# Patient Record
Sex: Male | Born: 1975 | Race: White | Hispanic: No | Marital: Single | Smoking: Former smoker
Health system: Southern US, Community
[De-identification: ages and names within clinical notes are randomized; demographics above are authoritative.]

## PROBLEM LIST (undated history)

## (undated) DIAGNOSIS — F419 Anxiety disorder, unspecified: Secondary | ICD-10-CM

## (undated) DIAGNOSIS — J45909 Unspecified asthma, uncomplicated: Secondary | ICD-10-CM

---

## 2005-04-14 ENCOUNTER — Emergency Department: Payer: Self-pay | Admitting: Emergency Medicine

## 2005-04-20 ENCOUNTER — Emergency Department: Payer: Self-pay | Admitting: Emergency Medicine

## 2006-08-19 ENCOUNTER — Emergency Department: Payer: Self-pay | Admitting: General Practice

## 2007-02-13 ENCOUNTER — Emergency Department: Payer: Self-pay | Admitting: Emergency Medicine

## 2007-05-05 ENCOUNTER — Emergency Department: Payer: Self-pay | Admitting: Emergency Medicine

## 2007-12-23 ENCOUNTER — Emergency Department: Payer: Self-pay | Admitting: Emergency Medicine

## 2010-01-03 ENCOUNTER — Emergency Department: Payer: Self-pay | Admitting: Emergency Medicine

## 2010-08-09 ENCOUNTER — Emergency Department: Payer: Self-pay | Admitting: Emergency Medicine

## 2011-01-20 ENCOUNTER — Emergency Department: Payer: Self-pay | Admitting: Emergency Medicine

## 2011-01-24 ENCOUNTER — Emergency Department: Payer: Self-pay | Admitting: Unknown Physician Specialty

## 2011-11-30 ENCOUNTER — Emergency Department: Payer: Self-pay | Admitting: Emergency Medicine

## 2013-01-21 ENCOUNTER — Emergency Department: Payer: Self-pay | Admitting: Emergency Medicine

## 2014-02-23 ENCOUNTER — Ambulatory Visit: Payer: Self-pay | Admitting: Orthopedic Surgery

## 2014-05-18 DIAGNOSIS — M5030 Other cervical disc degeneration, unspecified cervical region: Secondary | ICD-10-CM | POA: Insufficient documentation

## 2016-03-09 ENCOUNTER — Emergency Department
Admission: EM | Admit: 2016-03-09 | Discharge: 2016-03-09 | Disposition: A | Discharge status: Home / Self Care | Payer: Medicaid Other | Attending: Emergency Medicine | Admitting: Emergency Medicine

## 2016-03-09 ENCOUNTER — Encounter: Payer: Self-pay | Admitting: Emergency Medicine

## 2016-03-09 DIAGNOSIS — Y9384 Activity, sleeping: Secondary | ICD-10-CM | POA: Diagnosis not present

## 2016-03-09 DIAGNOSIS — X501XXA Overexertion from prolonged static or awkward postures, initial encounter: Secondary | ICD-10-CM | POA: Diagnosis not present

## 2016-03-09 DIAGNOSIS — Y999 Unspecified external cause status: Secondary | ICD-10-CM | POA: Insufficient documentation

## 2016-03-09 DIAGNOSIS — S4991XA Unspecified injury of right shoulder and upper arm, initial encounter: Secondary | ICD-10-CM | POA: Diagnosis present

## 2016-03-09 DIAGNOSIS — Y929 Unspecified place or not applicable: Secondary | ICD-10-CM | POA: Insufficient documentation

## 2016-03-09 DIAGNOSIS — J45909 Unspecified asthma, uncomplicated: Secondary | ICD-10-CM | POA: Insufficient documentation

## 2016-03-09 DIAGNOSIS — Z87891 Personal history of nicotine dependence: Secondary | ICD-10-CM | POA: Insufficient documentation

## 2016-03-09 DIAGNOSIS — M75101 Unspecified rotator cuff tear or rupture of right shoulder, not specified as traumatic: Secondary | ICD-10-CM

## 2016-03-09 DIAGNOSIS — M25511 Pain in right shoulder: Secondary | ICD-10-CM

## 2016-03-09 DIAGNOSIS — M12811 Other specific arthropathies, not elsewhere classified, right shoulder: Secondary | ICD-10-CM

## 2016-03-09 DIAGNOSIS — S46011A Strain of muscle(s) and tendon(s) of the rotator cuff of right shoulder, initial encounter: Secondary | ICD-10-CM | POA: Insufficient documentation

## 2016-03-09 DIAGNOSIS — M129 Arthropathy, unspecified: Secondary | ICD-10-CM | POA: Diagnosis not present

## 2016-03-09 HISTORY — DX: Unspecified asthma, uncomplicated: J45.909

## 2016-03-09 HISTORY — DX: Anxiety disorder, unspecified: F41.9

## 2016-03-09 MED ORDER — BACLOFEN 10 MG PO TABS: 10.0000 mg | ORAL_TABLET | Freq: Three times a day (TID) | ORAL | Status: DC

## 2016-03-09 MED ORDER — DIAZEPAM 5 MG/ML IJ SOLN
5.0000 mg | Freq: Once | INTRAMUSCULAR | Status: AC
  Administered 2016-03-09: 5 mg via INTRAVENOUS
  Filled 2016-03-09: qty 2

## 2016-03-09 MED ORDER — IBUPROFEN 800 MG PO TABS: 800.0000 mg | ORAL_TABLET | Freq: Three times a day (TID) | ORAL | Status: DC | PRN

## 2016-03-09 MED ORDER — HYDROMORPHONE HCL 1 MG/ML IJ SOLN
1.0000 mg | Freq: Once | INTRAMUSCULAR | Status: AC
Start: 2016-03-09 — End: 2016-03-09
  Administered 2016-03-09: 1 mg via INTRAVENOUS
  Filled 2016-03-09: qty 1

## 2016-03-09 MED ORDER — OXYCODONE-ACETAMINOPHEN 5-325 MG PO TABS: 1.0000 | ORAL_TABLET | ORAL | Status: DC | PRN

## 2016-03-09 NOTE — ED Notes (Addendum)
Pt arrived via EMS from home with shoulder and arm pain on the right side.  Patient reports 2 days ago patient noticed a pinch in neck muscle and c/o pain down to right elbow.  C/o not being able to feel right hand.  Pain is similar to RTC tear in 2015.  Denies any injury at this time.  Patient states he was asleep in the chair.  Patient states fingers feel numb.  Pt did not have surgery and states he tried other modalities instead of surgery.

## 2016-03-09 NOTE — Discharge Instructions (Signed)
Shoulder Pain The shoulder is the joint that connects your arms to your body. The bones that form the shoulder joint include the upper arm bone (humerus), the shoulder blade (scapula), and the collarbone (clavicle). The top of the humerus is shaped like a ball and fits into a rather flat socket on the scapula (glenoid cavity). A combination of muscles and strong, fibrous tissues that connect muscles to bones (tendons) support your shoulder joint and hold the ball in the socket. Small, fluid-filled sacs (bursae) are located in different areas of the joint. They act as cushions between the bones and the overlying soft tissues and help reduce friction between the gliding tendons and the bone as you move your arm. Your shoulder joint allows a wide range of motion in your arm. This range of motion allows you to do things like scratch your back or throw a ball. However, this range of motion also makes your shoulder more prone to pain from overuse and injury. Causes of shoulder pain can originate from both injury and overuse and usually can be grouped in the following four categories:  Redness, swelling, and pain (inflammation) of the tendon (tendinitis) or the bursae (bursitis).  Instability, such as a dislocation of the joint.  Inflammation of the joint (arthritis).  Broken bone (fracture). HOME CARE INSTRUCTIONS   Apply ice to the sore area.  Put ice in a plastic bag.  Place a towel between your skin and the bag.  Leave the ice on for 15-20 minutes, 3-4 times per day for the first 2 days, or as directed by your health care provider.  Stop using cold packs if they do not help with the pain.  If you have a shoulder sling or immobilizer, wear it as long as your caregiver instructs. Only remove it to shower or bathe. Move your arm as little as possible, but keep your hand moving to prevent swelling.  Squeeze a soft ball or foam pad as much as possible to help prevent swelling.  Only take  over-the-counter or prescription medicines for pain, discomfort, or fever as directed by your caregiver. SEEK MEDICAL CARE IF:   Your shoulder pain increases, or new pain develops in your arm, hand, or fingers.  Your hand or fingers become cold and numb.  Your pain is not relieved with medicines. SEEK IMMEDIATE MEDICAL CARE IF:   Your arm, hand, or fingers are numb or tingling.  Your arm, hand, or fingers are significantly swollen or turn white or blue. MAKE SURE YOU:   Understand these instructions.  Will watch your condition.  Will get help right away if you are not doing well or get worse.   This information is not intended to replace advice given to you by your health care provider. Make sure you discuss any questions you have with your health care provider.   Document Released: 07/17/2005 Document Revised: 10/28/2014 Document Reviewed: 01/30/2015 Elsevier Interactive Patient Education 2016 [place] therapy can help ease sore, stiff, injured, and tight muscles and joints. Heat relaxes your muscles, which may help ease your pain. Heat therapy should only be used on old, pre-existing, or long-lasting (chronic) injuries. Do not use heat therapy unless told by your doctor. HOW TO USE HEAT THERAPY There are several different kinds of heat therapy, including:  Moist heat pack.  Warm water bath.  Hot water bottle.  Electric heating pad.  Heated gel pack.  Heated wrap.  Electric heating pad. GENERAL HEAT THERAPY RECOMMENDATIONS  Do not sleep while using heat therapy. Only use heat therapy while you are awake.  Your skin may turn pink while using heat therapy. Do not use heat therapy if your skin turns red.  Do not use heat therapy if you have new pain.  High heat or long exposure to heat can cause burns. Be careful when using heat therapy to avoid burning your skin.  Do not use heat therapy on areas of your skin that are already irritated,  such as with a rash or sunburn. GET HELP IF:   You have blisters, redness, swelling (puffiness), or numbness.  You have new pain.  Your pain is worse. MAKE SURE YOU:  Understand these instructions.  Will watch your condition.  Will get help right away if you are not doing well or get worse.   This information is not intended to replace advice given to you by your health care provider. Make sure you discuss any questions you have with your health care provider.   Document Released: 12/30/2011 Document Revised: 10/28/2014 Document Reviewed: 11/30/2013 Elsevier Interactive Patient Education 2016 Elsevier Inc.  Cryotherapy Cryotherapy is when you put ice on your injury. Ice helps lessen pain and puffiness (swelling) after an injury. Ice works the best when you start using it in the first 24 to 48 hours after an injury. HOME CARE  Put a dry or damp towel between the ice pack and your skin.  You may press gently on the ice pack.  Leave the ice on for no more than 10 to 20 minutes at a time.  Check your skin after 5 minutes to make sure your skin is okay.  Rest at least 20 minutes between ice pack uses.  Stop using ice when your skin loses feeling (numbness).  Do not use ice on someone who cannot tell you when it hurts. This includes small children and people with memory problems (dementia). GET HELP RIGHT AWAY IF:  You have white spots on your skin.  Your skin turns blue or pale.  Your skin feels waxy or hard.  Your puffiness gets worse. MAKE SURE YOU:   Understand these instructions.  Will watch your condition.  Will get help right away if you are not doing well or get worse.   This information is not intended to replace advice given to you by your health care provider. Make sure you discuss any questions you have with your health care provider.   Document Released: 03/25/2008 Document Revised: 12/30/2011 Document Reviewed: 05/30/2011 Elsevier Interactive Patient  Education [employer]2016 Elsevier Inc.

## 2016-03-09 NOTE — ED Provider Notes (Signed)
[facility] Emergency Department Provider Note  ____________________________________________  Time seen: Approximately 12:08 PM  I have reviewed the triage vital signs and the nursing notes.   HISTORY  Chief Complaint Shoulder Pain and Arm Pain    HPI Jeffery King is a 40 y.o. male presents for evaluation via EMS for evaluation of severe pain in his neck and right shoulder radiating down his right elbow. Patient denies any trauma but reports that he has a rotator cuff tear back in 2015. Denies any recent injury other than he was sleeping in chair and felt like he slept wrong. Pain is progressively gotten worse over the last 48 hours states that he cannot feel his fingers that they feel decreased in sensation. Pain is 10 over 10. He has had physical therapy in the past for the shoulder and was scheduled to be operated on but his postpone the surgery.   Past Medical History  Diagnosis Date  . Asthma   . Anxiety     There are no active problems to display for this patient.   History reviewed. No pertinent past surgical history.  Current Outpatient Rx  Name  Route  Sig  Dispense  Refill  . baclofen (LIORESAL) 10 MG tablet   Oral   Take 1 tablet (10 mg total) by mouth 3 (three) times daily.   30 tablet   0   . ibuprofen (ADVIL,MOTRIN) 800 MG tablet   Oral   Take 1 tablet (800 mg total) by mouth every 8 (eight) hours as needed.   30 tablet   0   . oxyCODONE-acetaminophen (ROXICET) 5-325 MG tablet   Oral   Take 1-2 tablets by mouth every 4 (four) hours as needed for severe pain.   15 tablet   0     Allergies Aspirin; Gabapentin; Wellbutrin; and Ibuprofen  History reviewed. No pertinent family history.  Social History Social History  Substance Use Topics  . Smoking status: Former Games developermoker  . Smokeless tobacco: None  . Alcohol Use: No    Review of Systems Constitutional: No fever/chills Cardiovascular: Denies chest pain. Respiratory:  Denies shortness of breath. Musculoskeletal: Positive for cervical and right shoulder pain. Skin: Negative for rash. Neurological: Negative for headaches, focal weakness or numbness.  10-point ROS otherwise negative.  ____________________________________________   PHYSICAL EXAM:  VITAL SIGNS: ED Triage Vitals  Enc Vitals Group     BP 03/09/16 1146 117/80 mmHg     Pulse Rate 03/09/16 1146 117     Resp 03/09/16 1146 22     Temp 03/09/16 1146 98.2 F (36.8 C)     Temp Source 03/09/16 1146 Oral     SpO2 03/09/16 1146 98 %     Weight 03/09/16 1146 145 lb (65.772 kg)     Height 03/09/16 1146 5\' 11"  (1.803 m)     Head Cir --      Peak Flow --      Pain Score 03/09/16 1147 10     Pain Loc --      Pain Edu? --      Excl. in GC? --     Constitutional: Alert and oriented. Well appearing and in no acute distress. Neck: No stridor. Limited range of motion increased pain with flexion extension and lateralization.  Cardiovascular: Normal rate, regular rhythm. Grossly normal heart sounds.  Good peripheral circulation. Respiratory: Normal respiratory effort.  No retractions. Lungs CTAB. Musculoskeletal: Severe point tenderness to the cervical spine into the right shoulder. Resists  all type of movement abduction and abduction. Neurologic:  Normal speech and language. No gross focal neurologic deficits are appreciated. No gait instability. Skin:  Skin is warm, dry and intact. No rash noted. Psychiatric: Mood and affect are normal. Speech and behavior are normal.  ____________________________________________   LABS (all labs ordered are listed, but only abnormal results are displayed)  Labs Reviewed - No data to display ____________________________________________  EKG   ____________________________________________  RADIOLOGY  MRI reviewed from 2015. ____________________________________________   PROCEDURES  Procedure(s) performed: None  Critical Care performed:  No  ____________________________________________   INITIAL IMPRESSION / ASSESSMENT AND PLAN / ED COURSE  Pertinent labs & imaging results that were available during my care of the patient were reviewed by me and considered in my medical decision making (see chart for details).  Patient guided infraspinatus and supraspinatus tears. Patient responded well to Dilaudid and Valium IV. Patient follow-up with his orthopedic surgeon next week as scheduled. He voices no other emergency medical complaints at this time and states pain is down to a 5/10. ____________________________________________   FINAL CLINICAL IMPRESSION(S) / ED DIAGNOSES  Final diagnoses:  Shoulder pain, acute, right  Rotator cuff tear arthropathy of right shoulder     This chart was dictated using voice recognition software/Dragon. Despite best efforts to proofread, errors can occur which can change the meaning. Any change was purely unintentional.   Evangeline Dakin, PA-C 03/09/16 1238  Arnaldo Natal, MD 03/09/16 [phone]

## 2016-03-28 ENCOUNTER — Encounter: Payer: Self-pay | Admitting: Family Medicine

## 2016-03-28 ENCOUNTER — Ambulatory Visit (INDEPENDENT_AMBULATORY_CARE_PROVIDER_SITE_OTHER): Payer: Medicaid Other | Admitting: Family Medicine

## 2016-03-28 VITALS — BP 140/90 | HR 91 | Temp 98.5°F | Resp 16 | Ht 71.0 in | Wt 154.0 lb

## 2016-03-28 DIAGNOSIS — F419 Anxiety disorder, unspecified: Secondary | ICD-10-CM

## 2016-03-28 DIAGNOSIS — J4530 Mild persistent asthma, uncomplicated: Secondary | ICD-10-CM | POA: Insufficient documentation

## 2016-03-28 DIAGNOSIS — J4520 Mild intermittent asthma, uncomplicated: Secondary | ICD-10-CM

## 2016-03-28 DIAGNOSIS — M7541 Impingement syndrome of right shoulder: Secondary | ICD-10-CM

## 2016-03-28 MED ORDER — METHYLPREDNISOLONE 4 MG PO TABS
4.0000 mg | ORAL_TABLET | Freq: Every day | ORAL | Status: DC
Start: 2016-03-28 — End: 2016-08-01

## 2016-03-28 NOTE — Assessment & Plan Note (Signed)
Appears well controlled. Continue PRN rescue inhaler. Consider spirometry next visit.

## 2016-03-28 NOTE — Progress Notes (Signed)
Subjective:    Patient ID: Jeffery King, male    DOB: 09-08-1976, 40 y.o.   MRN: [id number]  HPI: Jeffery King is a 39 y.o. male presenting on 03/28/2016 for Establish Care   HPI  Pt presents to establish care today. Previous care provider was .  It has been Tejan-Sie month since His last PCP visit. Records from previous provider will be requested and reviewed. Current medical problems include:  Rotator Cuff tear: Was supposed to have surgery in 2015. Was feeling better with PT- so did not have surgery. Woke up 2 weeks ago with a crick in his neck and unable to move shoulder. Seen in ER on 5/20- given oxycodone and muscle relaxant. Pain has eased up still having numbness in R hand.  Was waiting for referral to ortho but did not hear yet. Still has disc with MRI.  Anxiety: Is currently on Buspar and Xanax for past 3 years. Takes Xanax TID  Every day. Buspar was making him feel like a zombie- taking 1/2 tablet twice daily. . Was previously seeing psychiatry. 5 years ago. Cannot remember other previous medications.  Asthma: Stopped smoking 1 year ago. Dx age 33. Uses Pro-Air for rescue. No exercise induced bronchospasm. Has not had exacerbation is many years.    Health maintenance:  Last labs- last month.    Past Medical History  Diagnosis Date  . Asthma   . Anxiety    Social History   Social History  . Marital Status: Single    Spouse Name: N/A  . Number of Children: N/A  . Years of Education: N/A   Occupational History  . Not on file.   Social History Main Topics  . Smoking status: Former Games developer  . Smokeless tobacco: Not on file  . Alcohol Use: No  . Drug Use: No  . Sexual Activity: Not on file   Other Topics Concern  . Not on file   Social History Narrative   Family History  Problem Relation Age of Onset  . Hypertension Mother   . Hypertension Father   . Hypertension Brother    Current Outpatient Prescriptions on File Prior to Visit  Medication Sig  . baclofen  (LIORESAL) 10 MG tablet Take 1 tablet (10 mg total) by mouth 3 (three) times daily.  Marland Kitchen oxyCODONE-acetaminophen (ROXICET) 5-325 MG tablet Take 1-2 tablets by mouth every 4 (four) hours as needed for severe pain.   No current facility-administered medications on file prior to visit.    Review of Systems  Constitutional: Negative for fever and chills.  HENT: Negative.   Respiratory: Negative for chest tightness, shortness of breath and wheezing.   Cardiovascular: Negative for chest pain, palpitations and leg swelling.  Gastrointestinal: Negative for nausea, vomiting and abdominal pain.  Endocrine: Negative.   Genitourinary: Negative for dysuria, urgency, discharge, penile pain and testicular pain.  Musculoskeletal: Negative for back pain, joint swelling and arthralgias.  Skin: Negative.   Neurological: Negative for dizziness, weakness, numbness and headaches.  Psychiatric/Behavioral: Negative for sleep disturbance and dysphoric mood.   Per HPI unless specifically indicated above     Objective:    BP 140/90 mmHg  Pulse 91  Temp(Src) 98.5 F (36.9 C) (Oral)  Resp 16  Ht  (1.803 m)  Wt 154 lb (69.854 kg)  BMI 21.49 kg/m2  Wt Readings from Last 3 Encounters:  03/28/16 154 lb (69.854 kg)  03/09/16 145 lb (65.772 kg)    Physical Exam  Constitutional: He is  oriented to person, place, and time. He appears well-developed and well-nourished. No distress.  HENT:  Head: Normocephalic and atraumatic.  Neck: Neck supple. No thyromegaly present.  Cardiovascular: Normal rate, regular rhythm and normal heart sounds.  Exam reveals no gallop and no friction rub.   No murmur heard. Pulmonary/Chest: Effort normal and breath sounds normal. He has no wheezes.  Abdominal: Soft. Bowel sounds are normal. He exhibits no distension. There is no tenderness. There is no rebound.  Musculoskeletal: He exhibits no edema.       Right shoulder: He exhibits decreased range of motion (Resists adduction  and abduction), tenderness and decreased strength.       Left shoulder: Normal.  Neurological: He is alert and oriented to person, place, and time. He has normal reflexes.  Skin: Skin is warm and dry. No rash noted. No erythema.  Psychiatric: He has a normal mood and affect. His behavior is normal. Thought content normal.   No results found for this or any previous visit.    Assessment & Plan:   Problem List Items Addressed This Visit      Respiratory   Asthma    Appears well controlled. Continue PRN rescue inhaler. Consider spirometry next visit.       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 Pt wanted to see Dr. Kelby AlineJoe Wilson at [facility] in [place]- however his first available was July 10. Pt is amenable to seeing [facility] in [place] at this time. Currently taking medrol dose pak and PRN pain medication. Well controlled.       Relevant Medications   methylPREDNISolone (MEDROL) 4 MG tablet   Other Relevant Orders   Ambulatory referral to Orthopedic Surgery   Anxiety    Currently taking buspar and xanax. Reviewed with patient that this office does not do long-term Xanax therapy for anxiety.  Pt is amenable to trying a new long-term medication after his shoulder surgery.       Relevant Medications   ALPRAZolam (XANAX) 1 MG tablet   busPIRone (BUSPAR) 5 MG tablet      Meds ordered this encounter  Medications  . albuterol (PROAIR HFA) 108 (90 Base) MCG/ACT inhaler    Sig: Inhale into the lungs.  . ALPRAZolam (XANAX) 1 MG tablet    Sig: Take by mouth.  . busPIRone (BUSPAR) 5 MG tablet    Sig: Take by mouth.  . methylPREDNISolone (MEDROL) 4 MG tablet    Sig: Take 1 tablet (4 mg total) by mouth daily. Take as directed.    Dispense:  21 tablet    Refill:  0     Order Specific Question:  Supervising Provider    Answer:  Janeann ForehandHAWKINS JR, JAMES H [[id number]]      Follow up plan: Return in about 4 weeks (around 04/25/2016) for Anxiety/ Asthma.

## 2016-03-28 NOTE — Assessment & Plan Note (Signed)
Currently taking buspar and xanax. Reviewed with patient that this office does not do long-term Xanax therapy for anxiety.  Pt is amenable to trying a new long-term medication after his shoulder surgery.

## 2016-03-28 NOTE — Patient Instructions (Signed)
We have placed a referral to [facility] for you. Hopefully you will hear in about 2 weeks about an appt.

## 2016-03-28 NOTE — Assessment & Plan Note (Signed)
Referral to [facility] placed today to determine if surgery is necessary. Pt wanted to see Dr. Kelby AlineJoe Wilson at [facility] in [place]- however his first available was July 10. Pt is amenable to seeing [facility] in [place] at this time. Currently taking medrol dose pak and PRN pain medication. Well controlled.

## 2016-04-17 ENCOUNTER — Telehealth: Payer: Self-pay | Admitting: *Deleted

## 2016-04-17 ENCOUNTER — Telehealth: Payer: Self-pay | Admitting: Family Medicine

## 2016-04-17 NOTE — Telephone Encounter (Signed)
Pt said he runs out of alprazolam and burpirone today and can't wait until Amy gets back on Monday to get refills.  Please call [phone]

## 2016-04-17 NOTE — Telephone Encounter (Signed)
Called patient to inform AK on vacation. We have received refill request for Buspar and Xanax.

## 2016-04-18 ENCOUNTER — Telehealth: Payer: Self-pay | Admitting: Family Medicine

## 2016-04-18 MED ORDER — ALPRAZOLAM 1 MG PO TABS: 15.0000 mg | ORAL_TABLET | Freq: Three times a day (TID) | ORAL | Status: DC

## 2016-04-18 MED ORDER — BUSPIRONE HCL 5 MG PO TABS: 5.0000 mg | ORAL_TABLET | ORAL | Status: DC

## 2016-04-18 NOTE — Telephone Encounter (Signed)
I will give him enough of these to last until Monday only.  Can order enough for 4 days.  Check with me if questions about strength and directions.-jh

## 2016-04-18 NOTE — Telephone Encounter (Signed)
Dr. Juanetta GoslingHawkins gave patient enough of medication to last until Mon 7/3 when AK returns. Patient's father presented today to p/u prescriptions. Since they were controlled substance a call was placed to patient by Rush BarerJanice Harlukowi to get verbal permission to give rx to his father. Patient apologized and said it was ok to give rx to his father.[place]

## 2016-04-18 NOTE — Telephone Encounter (Signed)
Left message rx ready for pick-up.[place]

## 2016-04-24 ENCOUNTER — Other Ambulatory Visit: Payer: Self-pay | Admitting: Family Medicine

## 2016-04-24 ENCOUNTER — Other Ambulatory Visit: Payer: Self-pay | Admitting: Orthopedic Surgery

## 2016-04-24 DIAGNOSIS — M5412 Radiculopathy, cervical region: Secondary | ICD-10-CM

## 2016-04-24 MED ORDER — BUSPIRONE HCL 7.5 MG PO TABS: 7.5000 mg | ORAL_TABLET | ORAL | Status: DC

## 2016-04-24 NOTE — Telephone Encounter (Signed)
Patient advised via voicemail.[place]

## 2016-04-24 NOTE — Telephone Encounter (Signed)
Pt. Call requesting a refill on Alprazolam 1 mg, Buspirone 5mg  (he said 7.5 mg ? Did not see that in  Pt chart) Pt  Call back  #  [phone]

## 2016-04-24 NOTE — Telephone Encounter (Signed)
Please let this patient know that I require an appt to refill a controlled medication (Xanax) if I have never filled it before. I have filled the buspar. It is waiting at the pharmacy.

## 2016-04-25 ENCOUNTER — Telehealth: Payer: Self-pay | Admitting: Family Medicine

## 2016-04-25 ENCOUNTER — Other Ambulatory Visit: Payer: Self-pay | Admitting: Family Medicine

## 2016-04-25 MED ORDER — ALPRAZOLAM 1 MG PO TABS: 1.0000 mg | ORAL_TABLET | Freq: Three times a day (TID) | ORAL | Status: DC

## 2016-04-30 ENCOUNTER — Ambulatory Visit (INDEPENDENT_AMBULATORY_CARE_PROVIDER_SITE_OTHER): Payer: Medicaid Other | Admitting: Family Medicine

## 2016-04-30 VITALS — BP 124/88 | HR 144 | Temp 98.8°F | Resp 16 | Ht 71.0 in | Wt 150.0 lb

## 2016-04-30 DIAGNOSIS — Z8249 Family history of ischemic heart disease and other diseases of the circulatory system: Secondary | ICD-10-CM | POA: Diagnosis not present

## 2016-04-30 DIAGNOSIS — F419 Anxiety disorder, unspecified: Secondary | ICD-10-CM | POA: Diagnosis not present

## 2016-04-30 DIAGNOSIS — J4520 Mild intermittent asthma, uncomplicated: Secondary | ICD-10-CM | POA: Diagnosis not present

## 2016-04-30 MED ORDER — ALPRAZOLAM 1 MG PO TABS: 1.0000 mg | ORAL_TABLET | Freq: Three times a day (TID) | ORAL | Status: DC

## 2016-04-30 NOTE — Assessment & Plan Note (Signed)
Refilled pt medications today. Have had a long discussion about Xanax- this office typically does not use longterm Xanax. Pt feels this combination has worked well for him since being started by anxiety but does have a goal to get off the medication eventually. Will make no medication changes today but will make changes in the future.

## 2016-04-30 NOTE — Assessment & Plan Note (Signed)
Controlled.  

## 2016-04-30 NOTE — Progress Notes (Signed)
Subjective:    Patient ID: Jeffery King, male    DOB: 1975/12/22, 40 y.o.   MRN: [id number]  HPI: Jeffery King is a 40 y.o. male presenting on 04/30/2016 for Anxiety   HPI  Pt presents for follow-up of anxiety and asthma.  Anxiety: Was given a refill of buspirone- at 7.5mg - taking 3.75mg  1/2 tablet every other day. Works for anxiety. Is taking Xanax  Up to 3 times per day as needed. Does not always take 3 times per day. Was previously seen by psychiatry.  Arm pain- is improving. Thought to be a pinched nerve in back. Seeing Dr. Real ConsKrazinski for shoulder pain. MRI schedule 7/20.  Asthma- controlled no cough, wheezing. No recent inhaler use.   Past Medical History  Diagnosis Date  . Asthma   . Anxiety     Current Outpatient Prescriptions on File Prior to Visit  Medication Sig  . albuterol (PROAIR HFA) 108 (90 Base) MCG/ACT inhaler Inhale into the lungs.  . baclofen (LIORESAL) 10 MG tablet Take 1 tablet (10 mg total) by mouth 3 (three) times daily.  . busPIRone (BUSPAR) 7.5 MG tablet Take 1 tablet (7.5 mg total) by mouth every other day.  . methylPREDNISolone (MEDROL) 4 MG tablet Take 1 tablet (4 mg total) by mouth daily. Take as directed.  Marland Kitchen. oxyCODONE-acetaminophen (ROXICET) 5-325 MG tablet Take 1-2 tablets by mouth every 4 (four) hours as needed for severe pain.   No current facility-administered medications on file prior to visit.    Review of Systems  Constitutional: Negative for fever and chills.  HENT: Negative.   Respiratory: Negative for chest tightness, shortness of breath and wheezing.   Cardiovascular: Negative for chest pain, palpitations and leg swelling.  Gastrointestinal: Negative for nausea, vomiting and abdominal pain.  Endocrine: Negative.   Genitourinary: Negative for dysuria, urgency, discharge, penile pain and testicular pain.  Musculoskeletal: Negative for back pain, joint swelling and arthralgias.  Skin: Negative.   Neurological: Negative for dizziness,  weakness, numbness and headaches.  Psychiatric/Behavioral: Negative for sleep disturbance and dysphoric mood.   Per HPI unless specifically indicated above     Objective:    BP 124/88 mmHg  Pulse 144  Temp(Src) 98.8 F (37.1 C) (Oral)  Resp 16  Ht 5\' 11"  (1.803 m)  Wt 150 lb (68.04 kg)  BMI 20.93 kg/m2  Wt Readings from Last 3 Encounters:  04/30/16 150 lb (68.04 kg)  03/28/16 154 lb (69.854 kg)  03/09/16 145 lb (65.772 kg)    Physical Exam  Constitutional: He is oriented to person, place, and time. He appears well-developed and well-nourished. No distress.  HENT:  Head: Normocephalic and atraumatic.  Neck: Neck supple. No thyromegaly present.  Cardiovascular: Normal rate, regular rhythm and normal heart sounds.  Exam reveals no gallop and no friction rub.   No murmur heard. Pulmonary/Chest: Effort normal and breath sounds normal. He has no wheezes.  Abdominal: Soft. Bowel sounds are normal. He exhibits no distension. There is no tenderness. There is no rebound.  Musculoskeletal: Normal range of motion. He exhibits no edema or tenderness.  Neurological: He is alert and oriented to person, place, and time. He has normal reflexes.  Skin: Skin is warm and dry. No rash noted. No erythema.  Psychiatric: He has a normal mood and affect. His behavior is normal. Thought content normal.   No results found for this or any previous visit.    GAD 7 : Generalized Anxiety Score 04/30/2016  Nervous, Anxious, on Edge  0  Control/stop worrying 3  Worry too much - different things 3  Trouble relaxing 0  Restless 0  Easily annoyed or irritable 0  Afraid - awful might happen 0  Total GAD 7 Score 6  Anxiety Difficulty Somewhat difficult      Assessment & Plan:   Problem List Items Addressed This Visit      Respiratory   Asthma    Controlled.       Relevant Orders   BASIC METABOLIC PANEL WITH GFR     Other   Anxiety - Primary    Refilled pt medications today. Have had a long  discussion about Xanax- this office typically does not use longterm Xanax. Pt feels this combination has worked well for him since being started by anxiety but does have a goal to get off the medication eventually. Will make no medication changes today but will make changes in the future.       Relevant Medications   ALPRAZolam (XANAX) 1 MG tablet   Other Relevant Orders   TSH   VITAMIN D 25 Hydroxy (Vit-D Deficiency, Fractures)    Other Visit Diagnoses    Family history of heart disease        Relevant Orders    Lipid Profile       Meds ordered this encounter  Medications  . ALPRAZolam (XANAX) 1 MG tablet    Sig: Take 1 tablet (1 mg total) by mouth 3 (three) times daily.    Dispense:  90 tablet    Refill:  2    Order Specific Question:  Supervising Provider    Answer:  Janeann Forehand [[id number]]      Follow up plan: Return in about 3 months (around 07/31/2016) for anxiety. Marland Kitchen

## 2016-04-30 NOTE — Patient Instructions (Addendum)
I have filled your medications today. We will get some lab work today as well.

## 2016-05-01 ENCOUNTER — Other Ambulatory Visit: Payer: Self-pay | Admitting: Family Medicine

## 2016-05-01 DIAGNOSIS — E559 Vitamin D deficiency, unspecified: Secondary | ICD-10-CM

## 2016-05-01 LAB — LIPID PANEL
CHOL/HDL RATIO: 2.7 ratio (ref <=5.0)
CHOLESTEROL: 148 mg/dL (ref 125–200)
HDL: 55 mg/dL (ref >=40)
LDL Cholesterol: 73 mg/dL (ref <130)
Triglycerides: 102 mg/dL (ref <150)
VLDL: 20 mg/dL (ref <30)

## 2016-05-01 LAB — BASIC METABOLIC PANEL WITH GFR
BUN: 8 mg/dL (ref 7–25)
CHLORIDE: 103 mmol/L (ref 98–110)
CO2: 26 mmol/L (ref 20–31)
Calcium: 9.6 mg/dL (ref 8.6–10.3)
Creat: 1.13 mg/dL (ref 0.60–1.35)
GFR, EST NON AFRICAN AMERICAN: 80 mL/min (ref >=60)
GFR, Est African American: >89 mL/min (ref >=60)
Glucose, Bld: 85 mg/dL (ref 65–99)
POTASSIUM: 4.3 mmol/L (ref 3.5–5.3)
Sodium: 142 mmol/L (ref 135–146)

## 2016-05-01 LAB — VITAMIN D 25 HYDROXY (VIT D DEFICIENCY, FRACTURES): Vit D, 25-Hydroxy: 21 ng/mL — ABNORMAL LOW (ref 30–100)

## 2016-05-01 LAB — TSH: TSH: 2.43 mIU/L (ref 0.40–4.50)

## 2016-05-01 MED ORDER — VITAMIN D (ERGOCALCIFEROL) 1.25 MG (50000 UNIT) PO CAPS: 50000.0000 [IU] | ORAL_CAPSULE | ORAL | Status: DC

## 2016-05-01 NOTE — Telephone Encounter (Signed)
Error

## 2016-05-09 ENCOUNTER — Ambulatory Visit
Admission: RE | Admit: 2016-05-09 | Discharge: 2016-05-09 | Disposition: A | Discharge status: Home / Self Care | Payer: Medicaid Other | Source: Ambulatory Visit | Attending: Orthopedic Surgery | Admitting: Orthopedic Surgery

## 2016-05-09 DIAGNOSIS — M50222 Other cervical disc displacement at C5-C6 level: Secondary | ICD-10-CM | POA: Insufficient documentation

## 2016-05-09 DIAGNOSIS — M5412 Radiculopathy, cervical region: Secondary | ICD-10-CM | POA: Diagnosis present

## 2016-08-01 ENCOUNTER — Ambulatory Visit (INDEPENDENT_AMBULATORY_CARE_PROVIDER_SITE_OTHER): Payer: Medicaid Other | Admitting: Family Medicine

## 2016-08-01 VITALS — BP 138/57 | HR 92 | Temp 98.7°F | Resp 16 | Ht 71.0 in | Wt 159.0 lb

## 2016-08-01 DIAGNOSIS — J4530 Mild persistent asthma, uncomplicated: Secondary | ICD-10-CM | POA: Diagnosis not present

## 2016-08-01 DIAGNOSIS — K219 Gastro-esophageal reflux disease without esophagitis: Secondary | ICD-10-CM

## 2016-08-01 DIAGNOSIS — F419 Anxiety disorder, unspecified: Secondary | ICD-10-CM

## 2016-08-01 DIAGNOSIS — H6122 Impacted cerumen, left ear: Secondary | ICD-10-CM

## 2016-08-01 LAB — CBC WITH DIFFERENTIAL/PLATELET
BASOS ABS: 0 {cells}/uL (ref 0–200)
Basophils Relative: 0 %
EOS ABS: 0 {cells}/uL — AB (ref 15–500)
EOS PCT: 0 %
HCT: 44.3 % (ref 38.5–50.0)
HEMOGLOBIN: 15 g/dL (ref 13.2–17.1)
Lymphocytes Relative: 30 %
Lymphs Abs: 1710 cells/uL (ref 850–3900)
MCH: 30.4 pg (ref 27.0–33.0)
MCHC: 33.9 g/dL (ref 32.0–36.0)
MCV: 89.7 fL (ref 80.0–100.0)
MONOS PCT: 15 %
MPV: 10 fL (ref 7.5–12.5)
Monocytes Absolute: 855 cells/uL (ref 200–950)
NEUTROS PCT: 55 %
Neutro Abs: 3135 cells/uL (ref 1500–7800)
PLATELETS: 226 10*3/uL (ref 140–400)
RBC: 4.94 MIL/uL (ref 4.20–5.80)
RDW: 13.4 % (ref 11.0–15.0)
WBC: 5.7 10*3/uL (ref 3.8–10.8)

## 2016-08-01 LAB — BASIC METABOLIC PANEL WITH GFR
BUN: 11 mg/dL (ref 7–25)
CALCIUM: 9.7 mg/dL (ref 8.6–10.3)
CHLORIDE: 104 mmol/L (ref 98–110)
CO2: 29 mmol/L (ref 20–31)
CREATININE: 0.91 mg/dL (ref 0.60–1.35)
GFR, Est African American: >89 mL/min (ref >=60)
GFR, Est Non African American: >89 mL/min (ref >=60)
Glucose, Bld: 88 mg/dL (ref 65–99)
Potassium: 6.1 mmol/L — ABNORMAL HIGH (ref 3.5–5.3)
SODIUM: 140 mmol/L (ref 135–146)

## 2016-08-01 MED ORDER — BECLOMETHASONE DIPROPIONATE 80 MCG/ACT IN AERS: 1.0000 | INHALATION_SPRAY | Freq: Two times a day (BID) | RESPIRATORY_TRACT | 12 refills | Status: DC

## 2016-08-01 MED ORDER — ALBUTEROL SULFATE HFA 108 (90 BASE) MCG/ACT IN AERS: 2.0000 | INHALATION_SPRAY | RESPIRATORY_TRACT | 11 refills | Status: DC | PRN

## 2016-08-01 MED ORDER — OMEPRAZOLE 20 MG PO CPDR: 20.0000 mg | DELAYED_RELEASE_CAPSULE | Freq: Every day | ORAL | 3 refills | Status: DC

## 2016-08-01 MED ORDER — ALPRAZOLAM 1 MG PO TABS: 1.0000 mg | ORAL_TABLET | Freq: Three times a day (TID) | ORAL | 2 refills | Status: DC

## 2016-08-01 NOTE — Assessment & Plan Note (Signed)
Trial of omeprazole to help with GERD symptoms. Check CBC. Alarm symptoms reviewed. Recheck in 4 weeks and then consider referral for EGD.

## 2016-08-01 NOTE — Assessment & Plan Note (Signed)
Have agreed to refill Xanax today. Had a long discussion with patient regarding longterm Xanax use and how this office does not typically use long-term Xanax for anxiety. Have agreed to fill today since this provider is currently leaving the practice and medication changes are not ideal at this time. Have prepared this patient that he may be asked to taper off this medication and or see psychiatry.

## 2016-08-01 NOTE — Assessment & Plan Note (Signed)
Asthma symptoms are more persistent- seem to be exacerbated by GERD symptoms. Will check CBC and BMP today. Start ICS for asthma control. Refilled PRN albuterol. Reviewed asthma alarm symptoms. Return for asthma follow-up and possible spirometry in 4 weeks.

## 2016-08-01 NOTE — Progress Notes (Signed)
Subjective:    Patient ID: Jeffery King, male    DOB: [birth year], 40 y.o.   MRN: [id number]  HPI: Jeffery King is a 40 y.o. male presenting on 08/01/2016 for Anxiety (same obtw pt diagnosed with sleep disc suggested to get PT done)   HPI   Pt presents for follow-up of anxiety. He is declining the GAD screening today. He feels his anxiety is doing well. He does not want to try any additional medications. He does want to see psychiatry.  He believes he had strep throat at home- treated himself at home with home abx. He had lymph node swelling. Had PCN at home took 10 day course- a previous physician had called it in. Was febrile to 101. Gets strep throat at least once per year. Have L ear pain intermittently since that time.  Neck and shoulder are doing much better. He does have some cervical disc disease.  Asthma increasing symptoms- taking 2-3 times per day his inhaler over past few months (2-3) Coughing more at night. Has also noticed more acid reflux symptoms at this time. Heartburn with certain foods increasing 2x per day.    Past Medical History:  Diagnosis Date  . Anxiety   . Asthma     Current Outpatient Prescriptions on File Prior to Visit  Medication Sig  . baclofen (LIORESAL) 10 MG tablet Take 1 tablet (10 mg total) by mouth 3 (three) times daily.  . busPIRone (BUSPAR) 7.5 MG tablet Take 1 tablet (7.5 mg total) by mouth every other day.  . Vitamin D, Ergocalciferol, (DRISDOL) 50000 units CAPS capsule Take 1 capsule (50,000 Units total) by mouth every 7 (seven) days.   No current facility-administered medications on file prior to visit.     Review of Systems  Constitutional: Negative for chills and fever.  HENT: Negative.  Negative for hearing loss, postnasal drip, rhinorrhea, sore throat and voice change.        Ear congestion L ear.    Respiratory: Negative for chest tightness, shortness of breath and wheezing.   Cardiovascular: Negative for chest pain, palpitations  and leg swelling.  Gastrointestinal: Negative for abdominal pain, nausea and vomiting.  Endocrine: Negative.   Genitourinary: Negative for discharge, dysuria, penile pain, testicular pain and urgency.  Musculoskeletal: Negative for arthralgias, back pain and joint swelling.  Skin: Negative.   Neurological: Negative for dizziness, weakness, numbness and headaches.  Psychiatric/Behavioral: Negative for agitation, behavioral problems, dysphoric mood and sleep disturbance. The patient is nervous/anxious.    Per HPI unless specifically indicated above     Objective:    BP (!) 138/57   Pulse 92   Temp 98.7 F (37.1 C) (Oral)   Resp 16   Ht 5\' 11"  (1.803 m)   Wt 159 lb (72.1 kg)   BMI 22.18 kg/m   Wt Readings from Last 3 Encounters:  08/01/16 159 lb (72.1 kg)  04/30/16 150 lb (68 kg)  03/28/16 154 lb (69.9 kg)    Physical Exam  Constitutional: He is oriented to person, place, and time. He appears well-developed and well-nourished. No distress.  HENT:  Head: Normocephalic and atraumatic.  Right Ear: Hearing and tympanic membrane normal. Tympanic membrane is not erythematous, not retracted and not bulging.  Left Ear: Tympanic membrane is not erythematous, not retracted and not bulging.  L ear cerumen impaction.   Neck: Neck supple. No thyromegaly present.  Cardiovascular: Normal rate, regular rhythm and normal heart sounds.  Exam reveals no gallop and no  friction rub.   No murmur heard. Pulmonary/Chest: Effort normal and breath sounds normal. He has no wheezes.  Abdominal: Soft. Bowel sounds are normal. He exhibits no distension. There is no tenderness. There is no rebound.  Musculoskeletal: Normal range of motion. He exhibits no edema or tenderness.  Neurological: He is alert and oriented to person, place, and time. He has normal reflexes.  Skin: Skin is warm and dry. No rash noted. No erythema.  Psychiatric: He has a normal mood and affect. His behavior is normal. Judgment and  thought content normal. Cognition and memory are normal. He expresses no suicidal ideation. He expresses no suicidal plans.   Results for orders placed or performed in visit on 04/30/16  BASIC METABOLIC PANEL WITH GFR  Result Value Ref Range   Sodium 142 135 - 146 mmol/L   Potassium 4.3 3.5 - 5.3 mmol/L   Chloride 103 98 - 110 mmol/L   CO2 26 20 - 31 mmol/L   Glucose, Bld 85 65 - 99 mg/dL   BUN 8 7 - 25 mg/dL   Creat 1.611.13 0.960.60 - 0.451.35 mg/dL   Calcium 9.6 8.6 - 40.910.3 mg/dL   GFR, Est African American >89 >=60 mL/min   GFR, Est Non African American 80 >=60 mL/min  Lipid Profile  Result Value Ref Range   Cholesterol 148 125 - 200 mg/dL   Triglycerides 811102 <914<150 mg/dL   HDL 55 >=78>=40 mg/dL   Total CHOL/HDL Ratio 2.7 <=5.0 Ratio   VLDL 20 <30 mg/dL   LDL Cholesterol 73 <295<130 mg/dL  TSH  Result Value Ref Range   TSH 2.43 0.40 - 4.50 mIU/L  VITAMIN D 25 Hydroxy (Vit-D Deficiency, Fractures)  Result Value Ref Range   Vit D, 25-Hydroxy 21 (L) 30 - 100 ng/mL      Assessment & Plan:   Problem List Items Addressed This Visit      Respiratory   Mild persistent asthma    Asthma symptoms are more persistent- seem to be exacerbated by GERD symptoms. Will check CBC and BMP today. Start ICS for asthma control. Refilled PRN albuterol. Reviewed asthma alarm symptoms. Return for asthma follow-up and possible spirometry in 4 weeks.       Relevant Medications   albuterol (PROAIR HFA) 108 (90 Base) MCG/ACT inhaler   beclomethasone (QVAR) 80 MCG/ACT inhaler   Other Relevant Orders   CBC with Differential     Digestive   Gastroesophageal reflux disease without esophagitis    Trial of omeprazole to help with GERD symptoms. Check CBC. Alarm symptoms reviewed. Recheck in 4 weeks and then consider referral for EGD.       Relevant Medications   omeprazole (PRILOSEC) 20 MG capsule   Other Relevant Orders   BASIC METABOLIC PANEL WITH GFR     Other   Anxiety - Primary    Have agreed to refill Xanax  today. Had a long discussion with patient regarding longterm Xanax use and how this office does not typically use long-term Xanax for anxiety. Have agreed to fill today since this provider is currently leaving the practice and medication changes are not ideal at this time. Have prepared this patient that he may be asked to taper off this medication and or see psychiatry.       Relevant Medications   ALPRAZolam (XANAX) 1 MG tablet    Other Visit Diagnoses    Impacted cerumen of left ear       Relevant Orders   Ear Lavage  Meds ordered this encounter  Medications  . ALPRAZolam (XANAX) 1 MG tablet    Sig: Take 1 tablet (1 mg total) by mouth 3 (three) times daily.    Dispense:  90 tablet    Refill:  2    Order Specific Question:   Supervising Provider    Answer:   Janeann Forehand [phone]  . albuterol (PROAIR HFA) 108 (90 Base) MCG/ACT inhaler    Sig: Inhale 2 puffs into the lungs every 4 (four) hours as needed for wheezing or shortness of breath.    Dispense:  1 Inhaler    Refill:  11    Order Specific Question:   Supervising Provider    Answer:   Janeann Forehand [phone]  . beclomethasone (QVAR) 80 MCG/ACT inhaler    Sig: Inhale 1 puff into the lungs 2 (two) times daily.    Dispense:  1 Inhaler    Refill:  12    Order Specific Question:   Supervising Provider    Answer:   Janeann Forehand [phone]  . omeprazole (PRILOSEC) 20 MG capsule    Sig: Take 1 capsule (20 mg total) by mouth daily.    Dispense:  30 capsule    Refill:  3    Order Specific Question:   Supervising Provider    Answer:   Janeann Forehand [[id number]]      Follow up plan: Return in about 1 month (around 09/01/2016), or if symptoms worsen or fail to improve, for GERD, asthma.

## 2016-08-01 NOTE — Patient Instructions (Addendum)
Tke Qvar twice daily to help with asthma symptoms. This will help decrease your need for inhalers. Start taking omeprazole once daily to help with acid reflux symptoms.  Food Choices for Gastroesophageal Reflux Disease, Adult When you have gastroesophageal reflux disease (GERD), the foods you eat and your eating habits are very important. Choosing the right foods can help ease the discomfort of GERD. WHAT GENERAL GUIDELINES DO I NEED TO FOLLOW?  Choose fruits, vegetables, whole grains, low-fat dairy products, and low-fat meat, fish, and poultry.  Limit fats such as oils, salad dressings, butter, nuts, and avocado.  Keep a food diary to identify foods that cause symptoms.  Avoid foods that cause reflux. These may be different for different people.  Eat frequent small meals instead of three large meals each day.  Eat your meals slowly, in a relaxed setting.  Limit fried foods.  Cook foods using methods other than frying.  Avoid drinking alcohol.  Avoid drinking large amounts of liquids with your meals.  Avoid bending over or lying down until 2-3 hours after eating. WHAT FOODS ARE NOT RECOMMENDED? The following are some foods and drinks that may worsen your symptoms: Vegetables Tomatoes. Tomato juice. Tomato and spaghetti sauce. Chili peppers. Onion and garlic. Horseradish. Fruits Oranges, grapefruit, and lemon (fruit and juice). Meats High-fat meats, fish, and poultry. This includes hot dogs, ribs, ham, sausage, salami, and bacon. Dairy Whole milk and chocolate milk. Sour cream. Cream. Butter. Ice cream. Cream cheese.  Beverages Coffee and tea, with or without caffeine. Carbonated beverages or energy drinks. Condiments Hot sauce. Barbecue sauce.  Sweets/Desserts Chocolate and cocoa. Donuts. Peppermint and spearmint. Fats and Oils High-fat foods, including [place] fries and potato chips. Other Vinegar. Strong spices, such as black pepper, white pepper, red pepper, cayenne,  curry powder, cloves, ginger, and chili powder. The items listed above may not be a complete list of foods and beverages to avoid. Contact your dietitian for more information.   This information is not intended to replace advice given to you by your health care provider. Make sure you discuss any questions you have with your health care provider.   Document Released: 10/07/2005 Document Revised: 10/28/2014 Document Reviewed: 08/11/2013 Elsevier Interactive Patient Education [employer]2016 Elsevier Inc.

## 2016-08-02 ENCOUNTER — Other Ambulatory Visit: Payer: Self-pay | Admitting: Family Medicine

## 2016-08-02 DIAGNOSIS — E875 Hyperkalemia: Secondary | ICD-10-CM

## 2016-08-02 MED ORDER — FUROSEMIDE 20 MG PO TABS: 20.0000 mg | ORAL_TABLET | Freq: Every day | ORAL | 0 refills | Status: DC

## 2016-08-10 ENCOUNTER — Other Ambulatory Visit: Payer: Self-pay | Admitting: Family Medicine

## 2016-08-10 DIAGNOSIS — E875 Hyperkalemia: Secondary | ICD-10-CM

## 2016-09-03 ENCOUNTER — Ambulatory Visit (INDEPENDENT_AMBULATORY_CARE_PROVIDER_SITE_OTHER): Payer: Medicaid Other | Admitting: Family Medicine

## 2016-09-03 ENCOUNTER — Encounter: Payer: Self-pay | Admitting: Family Medicine

## 2016-09-03 VITALS — BP 130/82 | HR 91 | Temp 98.4°F | Resp 16 | Ht 71.0 in | Wt 156.0 lb

## 2016-09-03 DIAGNOSIS — Z23 Encounter for immunization: Secondary | ICD-10-CM

## 2016-09-03 DIAGNOSIS — K219 Gastro-esophageal reflux disease without esophagitis: Secondary | ICD-10-CM | POA: Diagnosis not present

## 2016-09-03 DIAGNOSIS — F419 Anxiety disorder, unspecified: Secondary | ICD-10-CM

## 2016-09-03 NOTE — Progress Notes (Signed)
Subjective:    Patient ID: Jeffery King, male    DOB: 1976-01-06, 40 y.o.   MRN: [id number]  Jeffery King is a 40 y.o. male presenting on 09/03/2016 for Gastroesophageal Reflux   HPI   GERD: - Last visit 08/01/16, seen for variety of complaints including GERD, given tratment with Omeprazole 80m, but patient never picked it up after improvement in GERD - Reports history of chronic infrequent episodes of heartburn and some dyspepsia, occurs about 1-2x yearly, without worsening, unsure trigger, suspects dietary, uses TUMs OTC rarely with relief. - No prior history of GI evaluation - Denies any abdominal pain, nausea, vomiting, cough or throat clearing, dark stools, rectal bleeding, unintentional weight loss  ANXIETY: - Chronic problem intermittent worsening over many years (reports had some issues with anxiety as teenager) previously established with Psychiatry but has no longer followed over past several years, prior treatment with Alprazolam PRN with good results over past 5 years. - Today reviews episodic symptoms with suspected panic attacks with heart pounding and palpitations, will wake up overnight with infrequent attacks - Last visit with prior provider, discussed alternative medications and concerns with BDZ, he states that he understands these risks and tries to titrate dose only as needed, usually at night, and dose not overuse it. He has tried to self discontinue in the past but unsuccessful - Taking Buspar 7.53m previously on SSRI does not recall which one - Denies worsening anxiety, depression, thoughts of harming self or others  Health Maintenance: - Due for Flu shot today, would like to receive   Social History  Substance Use Topics  . Smoking status: Former SmResearch scientist (life sciences). Smokeless tobacco: Not on file  . Alcohol use No    Review of Systems Per HPI unless specifically indicated above     Objective:    BP 130/82 (BP Location: Left Arm, Cuff Size: Normal)   Pulse 91    Temp 98.4 F (36.9 C) (Oral)   Resp 16   Ht _0  (1.803 m)   Wt 156 lb (70.8 kg)   BMI 21.76 kg/m   Wt Readings from Last 3 Encounters:  09/03/16 156 lb (70.8 kg)  08/01/16 159 lb (72.1 kg)  04/30/16 150 lb (68 kg)    Physical Exam  Constitutional: He is oriented to person, place, and time. He appears well-developed and well-nourished. No distress.  Well-appearing, comfortable, cooperative  HENT:  Head: Normocephalic and atraumatic.  Mouth/Throat: Oropharynx is clear and moist.  Cardiovascular: Normal rate, regular rhythm, normal heart sounds and intact distal pulses.   No murmur heard. Pulmonary/Chest: Effort normal.  Neurological: He is alert and oriented to person, place, and time.  Skin: Skin is warm and dry. He is not diaphoretic.  Psychiatric: He has a normal mood and affect. His behavior is normal.  Mildly anxious today. Normal speech and thoughts.  Nursing note and vitals reviewed.    I have personally reviewed the following lab results from 08/01/16.  Results for orders placed or performed in visit on 08/01/16  CBC with Differential  Result Value Ref Range   WBC 5.7 3.8 - 10.8 K/uL   RBC 4.94 4.20 - 5.80 MIL/uL   Hemoglobin 15.0 13.2 - 17.1 g/dL   HCT 44.3 38.5 - 50.0 %   MCV 89.7 80.0 - 100.0 fL   MCH 30.4 27.0 - 33.0 pg   MCHC 33.9 32.0 - 36.0 g/dL   RDW 13.4 11.0 - 15.0 %   Platelets 226 140 -  400 K/uL   MPV 10.0 7.5 - 12.5 fL   Neutro Abs 3,135 1,500 - 7,800 cells/uL   Lymphs Abs 1,710 850 - 3,900 cells/uL   Monocytes Absolute 855 200 - 950 cells/uL   Eosinophils Absolute 0 (L) 15 - 500 cells/uL   Basophils Absolute 0 0 - 200 cells/uL   Neutrophils Relative % 55 %   Lymphocytes Relative 30 %   Monocytes Relative 15 %   Eosinophils Relative 0 %   Basophils Relative 0 %   Smear Review Criteria for review not met   BASIC METABOLIC PANEL WITH GFR  Result Value Ref Range   Sodium 140 135 - 146 mmol/L   Potassium 6.1 (H) 3.5 - 5.3 mmol/L    Chloride 104 98 - 110 mmol/L   CO2 29 20 - 31 mmol/L   Glucose, Bld 88 65 - 99 mg/dL   BUN 11 7 - 25 mg/dL   Creat 0.91 0.60 - 1.35 mg/dL   Calcium 9.7 8.6 - 10.3 mg/dL   GFR, Est African American >89 >=60 mL/min   GFR, Est Non African American >89 >=60 mL/min      Assessment & Plan:   Problem List Items Addressed This Visit    Gastroesophageal reflux disease without esophagitis - Primary    Well controlled without PPI, never started last rx  Plan: 1. Counseling on dietary modifications 2. May use OTC Omeprazole 86m as instructed as trial first 2-4 weeks if recurrent or worsening symptoms 3. Follow-up as needed      Anxiety    Well controlled chronically on Alprazolam for >5 years now, has tried other medications unsure which SSRI, also currently on Buspar  Plan: 1. Discussion with patient today regarding safety of benzodiazapines including alprazolam for chronic management of anxiety, he understands these risks but seems to have concerns with me not planning to do this treatment long term as previously discussed with his other provider last visit. Agree to review this again next visit. 2. Continue Buspar, has previously titrated dose, unlikely to increase 3. Future consider alternative SSRI as well, and taper off alprazolam vs trial on longer acting BDZ for discontinue titration. Also as recommended may need Psychiatry for future management of BDZ if needed       Other Visit Diagnoses    Needs flu shot       Relevant Orders   Flu Vaccine QUAD 36+ mos IM (Completed)      No orders of the defined types were placed in this encounter.     Follow up plan: Return in about 4 weeks (around 10/01/2016) for anxiety.   ANobie Putnam [facility] 09/03/2016, 11:15 PM

## 2016-09-03 NOTE — Patient Instructions (Signed)
Thank you for coming in to clinic today.  1. For GERD / Acid Reflux  If you get another flare up of GERD that is worsening or not improving and want to try the stronger anti acid medications OTC that we would prescribe for you here, see instructions below  Start the PPI medicine over the counter either Omeprazole 20mg  daily or Nexium 20mg  daily, Take one capsule 30 min before first meal of day, same time every day for at least 2 weeks, max treatment up to 4 weeks then stop. If it resolves but then comes back again, you can repeat the 2-4 week course again as needed. - Avoid spicy, greasy, fried foods, also things like caffeine, dark chocolate, peppermint can worsen - Avoid large meals and late night snacks, also do not go more than 4-5 hours without a snack or meal (not eating will worsen reflux symptoms due to stomach acid)  If symptoms are worsening, persistent symptoms despite treatment or develop esophageal or abdominal pain, unable to swallow solids or liquids, nausea, vomiting, fever/chills, or unintentional weight loss / no appetite, please follow-up sooner or seek more immediate medical attention.  Please schedule a follow-up appointment with Dr. Althea CharonKaramalegos in 1 month follow-up Anxiety  If you have any other questions or concerns, please feel free to call the clinic or send a message through MyChart. You may also schedule an earlier appointment if necessary.  Saralyn PilarAlexander Karamalegos, DO [facility]

## 2016-09-03 NOTE — Assessment & Plan Note (Signed)
Well controlled without PPI, never started last rx  Plan: 1. Counseling on dietary modifications 2. May use OTC Omeprazole 20mg  as instructed as trial first 2-4 weeks if recurrent or worsening symptoms 3. Follow-up as needed

## 2016-09-03 NOTE — Assessment & Plan Note (Signed)
Well controlled chronically on Alprazolam for >5 years now, has tried other medications unsure which SSRI, also currently on Buspar  Plan: 1. Discussion with patient today regarding safety of benzodiazapines including alprazolam for chronic management of anxiety, he understands these risks but seems to have concerns with me not planning to do this treatment long term as previously discussed with his other provider last visit. Agree to review this again next visit. 2. Continue Buspar, has previously titrated dose, unlikely to increase 3. Future consider alternative SSRI as well, and taper off alprazolam vs trial on longer acting BDZ for discontinue titration. Also as recommended may need Psychiatry for future management of BDZ if needed

## 2016-10-25 ENCOUNTER — Telehealth: Payer: Self-pay | Admitting: Family Medicine

## 2016-10-25 DIAGNOSIS — F419 Anxiety disorder, unspecified: Secondary | ICD-10-CM

## 2016-10-25 DIAGNOSIS — J4530 Mild persistent asthma, uncomplicated: Secondary | ICD-10-CM

## 2016-10-25 MED ORDER — ALBUTEROL SULFATE HFA 108 (90 BASE) MCG/ACT IN AERS: 2.0000 | INHALATION_SPRAY | RESPIRATORY_TRACT | 11 refills | Status: DC | PRN

## 2016-10-25 MED ORDER — BUSPIRONE HCL 7.5 MG PO TABS: 7.5000 mg | ORAL_TABLET | Freq: Every day | ORAL | 11 refills | Status: DC

## 2016-10-25 NOTE — Telephone Encounter (Signed)
Reviewed chart. Refilled Albuterol and Buspar (7.5mg ) #30 +11 refills, previously ordered for 1 tab every other day, however last visit patient stated he takes it daily, changed instructions to say take 1 daily, follow-up as planned.  Saralyn PilarAlexander Karamalegos, DO [facility] 10/25/2016, 4:47 PM

## 2016-10-25 NOTE — Telephone Encounter (Signed)
Pt. Called requesting refill on:  Pt. Call back  #  [phone]    [facility]  Inhaler, buspirone 7.5    [facility]

## 2016-10-26 ENCOUNTER — Other Ambulatory Visit: Payer: Self-pay | Admitting: Family Medicine

## 2016-10-26 DIAGNOSIS — F419 Anxiety disorder, unspecified: Secondary | ICD-10-CM

## 2016-11-13 ENCOUNTER — Ambulatory Visit (INDEPENDENT_AMBULATORY_CARE_PROVIDER_SITE_OTHER): Payer: Medicaid Other | Admitting: Family Medicine

## 2016-11-13 ENCOUNTER — Encounter: Payer: Self-pay | Admitting: Family Medicine

## 2016-11-13 VITALS — BP 138/98 | HR 97 | Temp 98.8°F | Ht 71.0 in | Wt 161.0 lb

## 2016-11-13 DIAGNOSIS — M5030 Other cervical disc degeneration, unspecified cervical region: Secondary | ICD-10-CM

## 2016-11-13 DIAGNOSIS — F419 Anxiety disorder, unspecified: Secondary | ICD-10-CM

## 2016-11-13 MED ORDER — ALPRAZOLAM 1 MG PO TABS: 1.0000 mg | ORAL_TABLET | Freq: Three times a day (TID) | ORAL | 2 refills | Status: DC

## 2016-11-13 MED ORDER — DICLOFENAC SODIUM 1 % TD GEL
2.0000 g | Freq: Three times a day (TID) | TRANSDERMAL | 2 refills | Status: DC | PRN
Start: 2016-11-13 — End: 2016-12-26

## 2016-11-13 NOTE — Patient Instructions (Signed)
Thank you for coming in to clinic today.  1. For anxiety - Refilled Xanax 90 pills, 2 refills - Will consider discussing further in future  2. C spine degenerative disease  Try topical Diclofenac if you can get it covered or try from pharmacy we may need to get it covered Use 3 times a day for now few weeks, and please stop this medication immediately if you get any allergic reaction at all  Look into Gabapentin and consider this option  If you want to try this let me know and you would end up starting 100mg  capsules take at night for 2-3 nights only, and then increase to 2 times a day for a few days, and then may increase to 3 times a day, it may make you drowsy, if helps significantly at night only, then you can increase instead to 3 capsules at night, instead of 3 times a day - In the future if needed, we can significantly increase the dose if tolerated well, some common doses are 300mg  three times a day up to 600mg  three times a day, usually it takes several weeks or months to get to higher doses  [facility] and we can discuss returning to discuss with them  Please schedule a follow-up appointment with Dr. Althea CharonKaramalegos in 6 weeks follow-up for Neck Pain  If you have any other questions or concerns, please feel free to call the clinic or send a message through MyChart. You may also schedule an earlier appointment if necessary.  Saralyn PilarAlexander Dorna Mallet, DO [facility]

## 2016-11-13 NOTE — Progress Notes (Signed)
Subjective:    Patient ID: Jeffery King, male    DOB: 1975/12/15, 41 y.o.   MRN: [id number]  Jeffery King is a 41 y.o. male presenting on 11/13/2016 for Medication Refill   HPI   FOLLOW-UP ANXIETY with Panic Attacks, Chronic: - Chronic problem intermittent worsening over past >25 years previously established with Psychiatry but has no longer followed over past several years, prior treatment with Alprazolam PRN with good results over past 5 years. - Today reports overall doing well on Xanax mostly TID for panic attack symptoms, occasionally few days a week he will only take BID, rarely ever takes less. Describes some withdrawal symptoms if goes longer without taking. In the past unable to self discontinue BDZ. - Taking Buspar 7.44m - Not ready to discuss additional anxiety therapy today - Denies worsening anxiety, depression, thoughts of harming self or others  CERVICAL DJD with Radiculopathy, Chronic Neck Pain - Chronic problem with C-spine DJD, diagnosed about 1 year following prior injury with R shoulder rotator cuff tears 2 years ago, regarding Neck pain / impingement tried PT first with improvement, and did not have surgery. [facility] 04/2016 with R C5-C6 disc involvement, seen by Dr KSelinda Eonby chart review (not [facility], improved in past with NSAIDs, Prednisone dosepak, and prior opiates for acute symptoms. Prior steroid injection R shoulder - Reports overall he has done well with gradual improvement in neck pain and fewer flare ups, recently had episode of intermittent flare only lasting few days with improvement, less radiating symptoms down R arm, now only some pains without tingling or paresthesias - Prior history on muscle relaxant, baclofen, no longer taking - Tried various NSAIDs, reports significant allergy with some lip swelling and tingling on Naproxen, Meloxicam, Ibuprofen, but he is interested in something to  reduce inflammation.  He can tolerate Aleve well without any problems. Would like to try topical NSAID - Never been on Gabapentin - Tylenol only uses occasionally - Admits neck muscle stiffness - Denies significant persistent weakness, numbness, tingling, new injury  Social History  Substance Use Topics  . Smoking status: Former SResearch scientist (life sciences) . Smokeless tobacco: Former USystems developer . Alcohol use No    Review of Systems Per HPI unless specifically indicated above     Objective:    BP (!) 138/98 (BP Location: Left Arm, Patient Position: Sitting, Cuff Size: Normal)   Pulse 97   Temp 98.8 F (37.1 C) (Oral)   Ht 5' 11"  (1.803 m)   Wt 161 lb (73 kg)   BMI 22.45 kg/m   Wt Readings from Last 3 Encounters:  11/13/16 161 lb (73 kg)  09/03/16 156 lb (70.8 kg)  08/01/16 159 lb (72.1 kg)    Physical Exam  Constitutional: He is oriented to person, place, and time. He appears well-developed and well-nourished. No distress.  Well-appearing, comfortable, cooperative  HENT:  Head: Normocephalic and atraumatic.  Mouth/Throat: Oropharynx is clear and moist.  Neck: Neck supple.  Neck Inspection: No significant abnormality, mild paraspinal cervical muscle hypertonicity R>L Palpation: Non-tender today ROM: Mildly reduced with Right rotation, some limited flex/ext  Cardiovascular: Normal rate, regular rhythm, normal heart sounds and intact distal pulses.   No murmur heard. Pulmonary/Chest: Effort normal.  Neurological: He is alert and oriented to person, place, and time.  Skin: Skin is warm and dry. No rash noted. He is not diaphoretic. No erythema.  Psychiatric: He has a normal mood and  affect. His behavior is normal.  Stable mildly anxious today. Normal speech and thoughts.  Nursing note and vitals reviewed.   I have personally reviewed the radiology report from MRI C-spine on 05/09/16.  CLINICAL DATA:  No known injury, pain in the neck. Right shoulder pain  EXAM: MRI CERVICAL SPINE WITHOUT  CONTRAST  TECHNIQUE: Multiplanar, multisequence MR imaging of the cervical spine was performed. No intravenous contrast was administered.  COMPARISON:  None.  FINDINGS: Alignment: Physiologic.  Vertebrae: No fracture, evidence of discitis, or bone lesion.  Cord: Normal signal and morphology.  Posterior Fossa, vertebral arteries, paraspinal tissues: Negative.  Disc levels:  Discs: Mild degenerative disc disease with disc height loss at C5-6.  C2-3: No significant disc bulge. No neural foraminal stenosis. No central canal stenosis.  C3-4: No significant disc bulge. No neural foraminal stenosis. No central canal stenosis.  C4-5: Small shallow central disc protrusion. No neural foraminal stenosis. No central canal stenosis.  C5-6: Right paracentral disc protrusion which contacts the right paracentral cervical spinal cord. No neural foraminal stenosis. No central canal stenosis.  C6-7: Small central disc protrusion. No neural foraminal stenosis. No central canal stenosis.  C7-T1: Tiny central disc protrusion. No neural foraminal stenosis. No central canal stenosis.  IMPRESSION: 1. At C5-6 there is a right paracentral disc protrusion contacting the right paracentral cervical spinal cord.   Electronically Signed   By: Kathreen Devoid   On: 05/09/2016 16:16   I have personally reviewed the following lab results from 08/01/16.  Results for orders placed or performed in visit on 08/01/16  CBC with Differential  Result Value Ref Range   WBC 5.7 3.8 - 10.8 K/uL   RBC 4.94 4.20 - 5.80 MIL/uL   Hemoglobin 15.0 13.2 - 17.1 g/dL   HCT 44.3 38.5 - 50.0 %   MCV 89.7 80.0 - 100.0 fL   MCH 30.4 27.0 - 33.0 pg   MCHC 33.9 32.0 - 36.0 g/dL   RDW 13.4 11.0 - 15.0 %   Platelets 226 140 - 400 K/uL   MPV 10.0 7.5 - 12.5 fL   Neutro Abs 3,135 1,500 - 7,800 cells/uL   Lymphs Abs 1,710 850 - 3,900 cells/uL   Monocytes Absolute 855 200 - 950 cells/uL   Eosinophils  Absolute 0 (L) 15 - 500 cells/uL   Basophils Absolute 0 0 - 200 cells/uL   Neutrophils Relative % 55 %   Lymphocytes Relative 30 %   Monocytes Relative 15 %   Eosinophils Relative 0 %   Basophils Relative 0 %   Smear Review Criteria for review not met   BASIC METABOLIC PANEL WITH GFR  Result Value Ref Range   Sodium 140 135 - 146 mmol/L   Potassium 6.1 (H) 3.5 - 5.3 mmol/L   Chloride 104 98 - 110 mmol/L   CO2 29 20 - 31 mmol/L   Glucose, Bld 88 65 - 99 mg/dL   BUN 11 7 - 25 mg/dL   Creat 0.91 0.60 - 1.35 mg/dL   Calcium 9.7 8.6 - 10.3 mg/dL   GFR, Est African American >89 >=60 mL/min   GFR, Est Non African American >89 >=60 mL/min      Assessment & Plan:   Problem List Items Addressed This Visit    Degenerative disc disease, cervical - Primary    Stable to improved with known C5-C6 DJD with disc protrusion with residual chronic neck stiffness/spasm and limited ROM with intermittent painful flares involving radicular pain down Right UE, recent  flare more mild and self limited without numbness. - Last imaging MRI 04/2016 - Previously followed by [facility] vs [facility] (for rotator cuff), patient unsure of physician chart review says Dr Mack Guise at [facility] - Allergic to most NSAIDs, Naproxen, Ibuprofen, Meloxicam - lip swelling / tingling without clear anaphylaxis reaction, however can tolerate Aleve well.  Plan: 1. Advised unsure why can tolerate Aleve and not Naproxen, patient is adamant that they are different, however perhaps it has to do with inactive ingredients. Limited options left, do not feel comfortable with any other oral NSAID trial, other options including topical NSAID, Gabapentin. He is interested to try topical Diclofenac NSAID to avoid further pill burden, he understands risks of potential NSAID allergy and will stop immediately if start to get allergic symptoms, aware when to seek immediate attention in ED for concerning allergy. Sent rx Diclofenac 1% gel  topical TID PRN on back of neck. 2. Continue home PT exercises 3. Recommend he return to Orthopedics for follow-up as planned, he is to find name if [facility], notify office if need new referral 4. Follow-up 6 weeks, strongly consider Gabapentin as more stable daily pain medicine with neuropathic relief for his radicular symptoms, otherwise can consider Duloxetine in future as SNRI to help pain and anxiety      Relevant Medications   diclofenac sodium (VOLTAREN) 1 % GEL   Anxiety    Stable, well controlled anxiety with panic chronically on Alprazolam for >5 years now. On buspar - Prior failed SSRI unsure which - Checked [place] CSRS today 11/13/16 for 1 year, consistent with prior PCP BDZ rx without red flags  Plan: 1. Recently filled Xanax 90 pills 30 day on 10/28/16, 0 refills, phoned in. He is due for next refill within 1-2 weeks. 2. Printed Xanax 72m TID #90, +2 refills rx today for 3 month supply to continue BDZ for chronic anxiety with panic for now as discussed last visit, will continue to review alternative options, risk of withdrawal means patient is going to need to slowly taper in future if able to come off of Xanax 3. Future consider alternative SSRI vs longer acting BDZ, may need return to Psychiatry if needed 4. Follow-up 3 months for Anxiety, if patient continues to require chronic BDZ for anxiety after initial 3 month period then will discuss options, [facility] until can make arrangements for other specialist follow-up if needed      Relevant Medications   ALPRAZolam (XANAX) 1 MG tablet      Meds ordered this encounter  Medications  . ALPRAZolam (XANAX) 1 MG tablet    Sig: Take 1 tablet (1 mg total) by mouth 3 (three) times daily.    Dispense:  90 tablet    Refill:  2  . diclofenac sodium (VOLTAREN) 1 % GEL    Sig: Apply 2 g topically 3 (three) times daily as needed.    Dispense:  100 g    Refill:  2     Follow up  plan: Return in about 6 weeks (around 12/25/2016) for Neck pain DJD.   [facility] 11/14/2016, 6:44 AM

## 2016-11-14 NOTE — Assessment & Plan Note (Addendum)
Stable, well controlled anxiety with panic chronically on Alprazolam for >5 years now. On buspar - Prior failed SSRI unsure which - Checked [place] CSRS today 11/13/16 for 1 year, consistent with prior PCP BDZ rx without red flags  Plan: 1. Recently filled Xanax 90 pills 30 day on 10/28/16, 0 refills, phoned in. He is due for next refill within 1-2 weeks. 2. Printed Xanax 1mg  TID #90, +2 refills rx today for 3 month supply to continue BDZ for chronic anxiety with panic for now as discussed last visit, will continue to review alternative options, risk of withdrawal means patient is going to need to slowly taper in future if able to come off of Xanax 3. Future consider alternative SSRI vs longer acting BDZ, may need return to Psychiatry if needed 4. Follow-up 3 months for Anxiety, if patient continues to require chronic BDZ for anxiety after initial 3 month period then will discuss options, [facility] with UDS until can make arrangements for other specialist follow-up if needed

## 2016-11-14 NOTE — Assessment & Plan Note (Signed)
Stable to improved with known C5-C6 DJD with disc protrusion with residual chronic neck stiffness/spasm and limited ROM with intermittent painful flares involving radicular pain down Right UE, recent flare more mild and self limited without numbness. - Last imaging MRI 04/2016 - [facility] patient unsure of physician chart review says Dr Martha ClanKrasinski at [facility] - Allergic to most NSAIDs, Naproxen, Ibuprofen, Meloxicam - lip swelling / tingling without clear anaphylaxis reaction, however can tolerate Aleve well.  Plan: 1. Advised unsure why can tolerate Aleve and not Naproxen, patient is adamant that they are different, however perhaps it has to do with inactive ingredients. Limited options left, do not feel comfortable with any other oral NSAID trial, other options including topical NSAID, Gabapentin. He is interested to try topical Diclofenac NSAID to avoid further pill burden, he understands risks of potential NSAID allergy and will stop immediately if start to get allergic symptoms, aware when to seek immediate attention in ED for concerning allergy. Sent rx Diclofenac 1% gel topical TID PRN on back of neck. 2. Continue home PT exercises 3. Recommend he return to Orthopedics for follow-up as planned, he is to find [facility], notify office if need new referral 4. Follow-up 6 weeks, strongly consider Gabapentin as more stable daily pain medicine with neuropathic relief for his radicular symptoms, otherwise can consider Duloxetine in future as SNRI to help pain and anxiety

## 2016-12-26 ENCOUNTER — Encounter: Payer: Self-pay | Admitting: Family Medicine

## 2016-12-26 ENCOUNTER — Ambulatory Visit (INDEPENDENT_AMBULATORY_CARE_PROVIDER_SITE_OTHER): Payer: Medicaid Other | Admitting: Family Medicine

## 2016-12-26 VITALS — BP 143/94 | HR 97 | Temp 98.7°F | Resp 16 | Ht 71.0 in | Wt 166.0 lb

## 2016-12-26 DIAGNOSIS — M5030 Other cervical disc degeneration, unspecified cervical region: Secondary | ICD-10-CM | POA: Diagnosis not present

## 2016-12-26 NOTE — Assessment & Plan Note (Signed)
Notable improvement today in chronic R sided neck pain and stiffness, with C5-C6 DJD with disc protrusion with residual chronic neck stiffness/spasm and limited ROM with intermittent painful flares involving radicular pain in RUE - Last imaging MRI 04/2016 - Previously followed by Ortho - Allergic to most NSAIDs  Plan: 1. Reassurance, and encouragement to continue conservative therapy, goal to avoid re-injury, stay active. No change in treatment plan today 2. Note patient never started Diclofenac (removed from rx list) he was not interested in this med, also never considered Gabapentin, not interested 3. Continue home PT exercises 4. Still would recommend future Orthopedics follow-up as planned if significant worsening, discussion on uncertain course / prognosis regarding future flares, explained that arthritis gradually worsens and does not resolve, may need other interventions in future 5. Follow-up 5 weeks for anxiety/med refills, then 3 months in 04/2017 for Annual Physical, also did not discuss today but note can consider Duloxetine in future as SNRI to help pain and anxiety

## 2016-12-26 NOTE — Patient Instructions (Addendum)
Thank you for coming in to clinic today.  I'm encouraged to hear that you are doing better today!  Please schedule a follow-up appointment with Dr. Althea CharonKaramalegos in 1.5 months or mid April (sooner if needed) Anxiety / Med Refill / Neck Pain  Next plan on Annual Physical after 04/30/17 (will get fasting blood work at that time)  If you have any other questions or concerns, please feel free to call the clinic or send a message through MyChart. You may also schedule an earlier appointment if necessary.  Saralyn PilarAlexander Shalyn Koral, DO [facility]

## 2016-12-26 NOTE — Progress Notes (Signed)
Subjective:    Patient ID: Jeffery King, male    DOB: 06-May-1976, 41 y.o.   MRN: [id number]  Jeffery King is a 41 y.o. male presenting on 12/26/2016 for Shoulder Pain   HPI   CERVICAL DJD with Radiculopathy, Chronic Neck Pain - Last visit 11/13/16 for C-spine DJD, see note for background on chronic problem - Today reports doing very well. He did not start Diclofenac gel topical as prescribed, looked into this option and decided against it, no barrier to cost or coverage. Also he considered Gabapentin and declined starting this as well ( no rx was sent, but he did not request it ) - Still admits some occasional pinching in R shoulder with his known chronic neck pain, he has occasional bad days but now mostly good days. Recently able to "throw ball outside" with his boys without any inflamed symptoms or shoulder pain after - He has not scheduled any further Orthopedic appointments, last discussion were unsure if [facility], he does not plan to see them unless worsening problem - No longer on muscle relaxant. No NSAIDs due to intolerance. Taking occasional Tylenol - Denies significant persistent weakness, numbness, tingling, new injury  Social History  Substance Use Topics  . Smoking status: Former Games developermoker  . Smokeless tobacco: Former NeurosurgeonUser  . Alcohol use No    Review of Systems Per HPI unless specifically indicated above     Objective:    BP (!) 143/94   Pulse 97   Temp 98.7 F (37.1 C) (Oral)   Resp 16   Ht 5\' 11"  (1.803 m)   Wt 166 lb (75.3 kg)   BMI 23.15 kg/m   Wt Readings from Last 3 Encounters:  12/26/16 166 lb (75.3 kg)  11/13/16 161 lb (73 kg)  09/03/16 156 lb (70.8 kg)    Physical Exam  Constitutional: He is oriented to person, place, and time. He appears well-developed and well-nourished. No distress.  Well-appearing, comfortable, cooperative  HENT:  Head: Normocephalic and atraumatic.  Mouth/Throat: Oropharynx is clear and moist.  Neck:  Neck supple.  Neck Inspection: No significant abnormality Palpation: Non-tender today. Still persistent mild to moderate generalized R lower cervical with trapezius muscle spasm and hypertonicity ROM: Significantly improved neck ROM with flex/ext, rotation improved and also R shoulder ROM full flex / ext, int/ext rotation  Cardiovascular: Normal rate, regular rhythm, normal heart sounds and intact distal pulses.   No murmur heard. Pulmonary/Chest: Effort normal.  Neurological: He is alert and oriented to person, place, and time.  Skin: Skin is warm and dry. No rash noted. He is not diaphoretic. No erythema.  Psychiatric: He has a normal mood and affect. His behavior is normal.  Well groomed, good eye contact, normal speech and thoughts  Nursing note and vitals reviewed.       Assessment & Plan:   Problem List Items Addressed This Visit    Degenerative disc disease, cervical - Primary    Notable improvement today in chronic R sided neck pain and stiffness, with C5-C6 DJD with disc protrusion with residual chronic neck stiffness/spasm and limited ROM with intermittent painful flares involving radicular pain in RUE - Last imaging MRI 04/2016 - Previously followed by Ortho - Allergic to most NSAIDs  Plan: 1. Reassurance, and encouragement to continue conservative therapy, goal to avoid re-injury, stay active. No change in treatment plan today 2. Note patient never started Diclofenac (removed from rx list) he was not interested in this med,  also never considered Gabapentin, not interested 3. Continue home PT exercises 4. Still would recommend future Orthopedics follow-up as planned if significant worsening, discussion on uncertain course / prognosis regarding future flares, explained that arthritis gradually worsens and does not resolve, may need other interventions in future 5. Follow-up 5 weeks for anxiety/med refills, then 3 months in 04/2017 for Annual Physical, also did not discuss today  but note can consider Duloxetine in future as SNRI to help pain and anxiety         No orders of the defined types were placed in this encounter.    Follow up plan: Return in about 5 weeks (around 02/02/2017) for Anxiety, Med Refill / Neck Pain.   [name], DO [facility] 12/26/2016, 2:41 PM

## 2017-02-11 ENCOUNTER — Telehealth: Payer: Self-pay | Admitting: Family Medicine

## 2017-02-11 DIAGNOSIS — B850 Pediculosis due to Pediculus humanus capitis: Secondary | ICD-10-CM

## 2017-02-11 MED ORDER — PERMETHRIN 1 % EX LOTN: 1.0000 "application " | TOPICAL_LOTION | Freq: Once | CUTANEOUS | 1 refills | Status: DC | PRN

## 2017-02-11 MED ORDER — PERMETHRIN 5 % EX CREA: TOPICAL_CREAM | CUTANEOUS | 1 refills | Status: DC

## 2017-02-11 NOTE — Telephone Encounter (Signed)
Called patient back to review, he requests only for medication for himself covered by medicaid on PDL, confirmed this is Permethrin 5% Cream, not lotion, changed rx. He has 3 children 5 yr, 8 yr, 10 yr. Children had this problem 1 month ago. They have medicine from Pediatrician.  He may repeat dose ONCE in 1 week if lice is unresolved.  Rx has the instructions on it, but briefly can review instructions for Head Lice Treatment  - Prior to application, wash hair with conditioner-free shampoo; rinse with water and towel dry - Apply a sufficient amount of lotion or cream rinse to saturate the hair and scalp (especially behind the ears and nape of neck) - Leave on hair for 10 minutes ONLY, then rinse off with warm water; remove remaining nits with nit comb.  - A single application is generally sufficient; however, may repeat 7 days after first treatment if lice or nits are still present  [name], DO [facility] 02/11/2017, 5:00 PM

## 2017-02-11 NOTE — Telephone Encounter (Signed)
Pt's children have lice and he asked to have something called in to [facility] that medicaid will pay for so he can treat himself and the kids.  His call back number is [phone]

## 2017-02-20 ENCOUNTER — Ambulatory Visit (INDEPENDENT_AMBULATORY_CARE_PROVIDER_SITE_OTHER): Payer: Medicaid Other | Admitting: Family Medicine

## 2017-02-20 ENCOUNTER — Encounter: Payer: Self-pay | Admitting: Family Medicine

## 2017-02-20 ENCOUNTER — Other Ambulatory Visit: Payer: Self-pay | Admitting: Family Medicine

## 2017-02-20 VITALS — BP 122/88 | HR 81 | Temp 98.5°F | Resp 16 | Ht 71.0 in | Wt 167.0 lb

## 2017-02-20 DIAGNOSIS — F419 Anxiety disorder, unspecified: Secondary | ICD-10-CM | POA: Diagnosis not present

## 2017-02-20 DIAGNOSIS — Z0000 Encounter for general adult medical examination without abnormal findings: Secondary | ICD-10-CM

## 2017-02-20 DIAGNOSIS — M5030 Other cervical disc degeneration, unspecified cervical region: Secondary | ICD-10-CM

## 2017-02-20 DIAGNOSIS — J4530 Mild persistent asthma, uncomplicated: Secondary | ICD-10-CM | POA: Diagnosis not present

## 2017-02-20 DIAGNOSIS — J3089 Other allergic rhinitis: Secondary | ICD-10-CM

## 2017-02-20 DIAGNOSIS — E875 Hyperkalemia: Secondary | ICD-10-CM

## 2017-02-20 DIAGNOSIS — E559 Vitamin D deficiency, unspecified: Secondary | ICD-10-CM

## 2017-02-20 DIAGNOSIS — Z114 Encounter for screening for human immunodeficiency virus [HIV]: Secondary | ICD-10-CM

## 2017-02-20 MED ORDER — ALPRAZOLAM 1 MG PO TABS: 1.0000 mg | ORAL_TABLET | Freq: Three times a day (TID) | ORAL | 2 refills | Status: DC

## 2017-02-20 MED ORDER — MONTELUKAST SODIUM 10 MG PO TABS: 10.0000 mg | ORAL_TABLET | Freq: Every day | ORAL | 5 refills | Status: DC

## 2017-02-20 MED ORDER — LORATADINE 10 MG PO TABS: 10.0000 mg | ORAL_TABLET | Freq: Every day | ORAL | 11 refills | Status: DC

## 2017-02-20 NOTE — Assessment & Plan Note (Signed)
Stable, well controlled anxiety with panic - H/o >25 years, on xanax stable >5 years - Prior failed SSRI unsure which - Checked [place] CSRS today 02/20/17 for 1 year, appropriate filling of xanax only  Plan: 1. Printed Xanax 1mg  TID #90, +2 refills rx today for 3 month supply to continue BDZ for chronic anxiety with panic for now, reviewed again today chronic complications of long-term BDZ use and withdrawal risks, he will continue to gradually taper when ready 2. Signed [facility] for [facility] today, to be scanned to chart, temporary good for 1 year, 02/20/18, will plan on checking UDS in 3 months 3. Declined Duloxetine SNRI for anxiety/pain 4. Follow-up 3 months for Annual Physical also refill Xanax

## 2017-02-20 NOTE — Assessment & Plan Note (Signed)
Recent worsening flare, chronic problem, triggering asthma Start Singulair and Loratadine Future consider Flonase if needed

## 2017-02-20 NOTE — Assessment & Plan Note (Signed)
Chronic asthma, mild intermittent vs mild persistent, without acute exac. - Trigger with environmental allergies, possibly some exercise induced - Exam mostly normal with some mild tightness in breathing, good air movement, no wheezing. - Incorrect use of inhalers, using albuterol daily instead of ICS  Plan: 1. Discussion on asthma pathophysiology, management and complications 2. Discontinue Qvar since not using properly and prefers not to use daily 3. Start new rx Singulair 10mg  nightly for allergy/asthma maintenance 4. Continue Albuterol ProAir as PRN ONLY for rescue inhaler, monitor use, if using >2-3x weekly or nightly if awakening need to follow-up and re-evaluate, may need ICS back as well 5. Add Loratadine for allergies 6. Follow-up as needed, next visit Annual Physical 3 months

## 2017-02-20 NOTE — Assessment & Plan Note (Signed)
Stable, prior improvement. No flares since last visit - Known C5-C6 DJD with disc protrusion with residual chronic neck stiffness/spasm and limited ROM with intermittent painful flares involving radicular pain in RUE - Last imaging MRI 04/2016 - Previously followed by Ortho - Allergic to most NSAIDs  Plan: 1. Reassurance, and encouragement to continue conservative therapy, goal to avoid re-injury, stay active. No change in treatment plan today 2. Reviewed medications he has declined - never took Diclofenac, Gabapentin. And not interested in Duloxetine for both anxiety and chronic pain 3. Continue home PT exercises 4. Still would recommend future Orthopedics follow-up as planned if significant worsening, discussion on uncertain course / prognosis regarding future flares, explained that arthritis gradually worsens and does not resolve, may need other interventions in future 5. Follow-up PRN

## 2017-02-20 NOTE — Progress Notes (Signed)
Subjective:    Patient ID: Jeffery King, male    DOB: Mar 07, 1976, 41 y.o.   MRN: [id number]  NAJI MEHRINGER is a 41 y.o. male presenting on 02/20/2017 for Anxiety (still same )   HPI   FOLLOW-UP ANXIETY with Panic Attacks, Chronic: - Last visit for this problem 11/13/16, see note for background information on chronic history >25 years of anxiety. - He continues to do well on current therapy Xanax 13m TID and Buspar 7.550mdaily. He is not interested in any changes today. Here for refills. - No recent panic flares or worsening symptoms, overall improved - He has tried to occasionally reduce Xanax doses, will take regular dose most days, sometimes will skip afternoon dose, often feels he needs evening dose. Has not had significant withdrawal symptoms. - Feels like some of the anxiety is "Mind over matter" and he will try to work on improving his coping mechanisms, he is concerned about chronic use xanax, asking safety in future - Denies worsening anxiety, depression, thoughts of harming self or others  FOLLOW-UP CERVICAL DJD with Radiculopathy, Chronic Neck Pain - Last visit 12/26/16 for same problem, see chart for background information for this chronic problem. - Today he reports overall doing well in the interval. No significant set backs or problems. Overall his R neck and shoulder pain has much improved, wakes up in less pain and stiffness. Still has occasional problems. His activity has increased, more active outside now. He remains cautious due to past history of acute flare, and he is not interested in any other change of therapy. - Admits neck muscle stiffness, still R rotation limited, feels a "pressure" - Denies significant persistent weakness, numbness, tingling, new injury  Asthma, Chronic Mild Persistent / Environmental and Seasonal Allergies - Reports recent worsening with allergies, in past has been on variety of other treatments, none currently. Had some sinus congestion and post  nasal drainage, some improved. Allergies seem to trigger his asthma, asking about inhalers, he has been using ProAir once daily everyday for months or longer, thought this was steroid maintenance. No longer using Qvar. - Interested in alternative asthma maintenance - Denies night-time awakening - Admits some wheezing or coughing spells with asthma due to outdoor exposures  Social History  Substance Use Topics  . Smoking status: Former SmResearch scientist (life sciences). Smokeless tobacco: Former [employer] developer. Alcohol use No    Review of Systems Per HPI unless specifically indicated above     Objective:    BP 122/88 (BP Location: Left Arm, Cuff Size: Normal)   Pulse 81   Temp 98.5 F (36.9 C) (Oral)   Resp 16   Ht 5' 11"  (1.803 m)   Wt 167 lb (75.8 kg)   BMI 23.29 kg/m   Wt Readings from Last 3 Encounters:  02/20/17 167 lb (75.8 kg)  12/26/16 166 lb (75.3 kg)  11/13/16 161 lb (73 kg)    Physical Exam  Constitutional: He is oriented to person, place, and time. He appears well-developed and well-nourished. No distress.  Well-appearing, comfortable, cooperative  HENT:  Head: Normocephalic and atraumatic.  Mouth/Throat: Oropharynx is clear and moist.  Frontal / maxillary sinuses non-tender. Nares patent without purulence or edema. Oropharynx with mild evidence of postnasal drainage generalized irritation without erythema, exudates, edema or asymmetry.  Eyes: Conjunctivae are normal. Right eye exhibits no discharge. Left eye exhibits no discharge.  Neck: Neck supple.  Neck - unchanged since last visit Inspection: No significant abnormality, mild paraspinal cervical muscle  hypertonicity R>L Palpation: Non-tender today ROM: Mildly reduced with Right rotation, some limited flex/ext  Cardiovascular: Normal rate, regular rhythm, normal heart sounds and intact distal pulses.   No murmur heard. Pulmonary/Chest: Effort normal. No respiratory distress. He has no wheezes. He has no rales.  Good air movement but some  tight slightly coarse breath sounds diffusely. No overt wheezing. Speaks full sentences  Neurological: He is alert and oriented to person, place, and time.  Skin: Skin is warm and dry. No rash noted. He is not diaphoretic. No erythema.  Psychiatric: He has a normal mood and affect. His behavior is normal.  Well groomed, good eye contact, normal speech and thoughts. Stable mildly anxious  Nursing note and vitals reviewed.   Results for orders placed or performed in visit on 08/01/16  CBC with Differential  Result Value Ref Range   WBC 5.7 3.8 - 10.8 K/uL   RBC 4.94 4.20 - 5.80 MIL/uL   Hemoglobin 15.0 13.2 - 17.1 g/dL   HCT 44.3 38.5 - 50.0 %   MCV 89.7 80.0 - 100.0 fL   MCH 30.4 27.0 - 33.0 pg   MCHC 33.9 32.0 - 36.0 g/dL   RDW 13.4 11.0 - 15.0 %   Platelets 226 140 - 400 K/uL   MPV 10.0 7.5 - 12.5 fL   Neutro Abs 3,135 1,500 - 7,800 cells/uL   Lymphs Abs 1,710 850 - 3,900 cells/uL   Monocytes Absolute 855 200 - 950 cells/uL   Eosinophils Absolute 0 (L) 15 - 500 cells/uL   Basophils Absolute 0 0 - 200 cells/uL   Neutrophils Relative % 55 %   Lymphocytes Relative 30 %   Monocytes Relative 15 %   Eosinophils Relative 0 %   Basophils Relative 0 %   Smear Review Criteria for review not met   BASIC METABOLIC PANEL WITH GFR  Result Value Ref Range   Sodium 140 135 - 146 mmol/L   Potassium 6.1 (H) 3.5 - 5.3 mmol/L   Chloride 104 98 - 110 mmol/L   CO2 29 20 - 31 mmol/L   Glucose, Bld 88 65 - 99 mg/dL   BUN 11 7 - 25 mg/dL   Creat 0.91 0.60 - 1.35 mg/dL   Calcium 9.7 8.6 - 10.3 mg/dL   GFR, Est African American >89 >=60 mL/min   GFR, Est Non African American >89 >=60 mL/min      Assessment & Plan:   Problem List Items Addressed This Visit    Mild persistent asthma - Primary    Chronic asthma, mild intermittent vs mild persistent, without acute exac. - Trigger with environmental allergies, possibly some exercise induced - Exam mostly normal with some mild tightness in  breathing, good air movement, no wheezing. - Incorrect use of inhalers, using albuterol daily instead of ICS  Plan: 1. Discussion on asthma pathophysiology, management and complications 2. Discontinue Qvar since not using properly and prefers not to use daily 3. Start new rx Singulair 37m nightly for allergy/asthma maintenance 4. Continue Albuterol ProAir as PRN ONLY for rescue inhaler, monitor use, if using >2-3x weekly or nightly if awakening need to follow-up and re-evaluate, may need ICS back as well 5. Add Loratadine for allergies 6. Follow-up as needed, next visit Annual Physical 3 months      Relevant Medications   montelukast (SINGULAIR) 10 MG tablet   loratadine (CLARITIN) 10 MG tablet   Environmental and seasonal allergies    Recent worsening flare, chronic problem, triggering asthma Start Singulair  and Loratadine Future consider Flonase if needed      Relevant Medications   montelukast (SINGULAIR) 10 MG tablet   loratadine (CLARITIN) 10 MG tablet   Degenerative disc disease, cervical    Stable, prior improvement. No flares since last visit - Known C5-C6 DJD with disc protrusion with residual chronic neck stiffness/spasm and limited ROM with intermittent painful flares involving radicular pain in RUE - Last imaging MRI 04/2016 - Previously followed by Ortho - Allergic to most NSAIDs  Plan: 1. Reassurance, and encouragement to continue conservative therapy, goal to avoid re-injury, stay active. No change in treatment plan today 2. Reviewed medications he has declined - never took Diclofenac, Gabapentin. And not interested in Duloxetine for both anxiety and chronic pain 3. Continue home PT exercises 4. Still would recommend future Orthopedics follow-up as planned if significant worsening, discussion on uncertain course / prognosis regarding future flares, explained that arthritis gradually worsens and does not resolve, may need other interventions in future 5. Follow-up  PRN      Anxiety    Stable, well controlled anxiety with panic - H/o >25 years, on xanax stable >5 years - Prior failed SSRI unsure which - Checked [place] CSRS today 02/20/17 for 1 year, appropriate filling of xanax only  Plan: 1. Printed Xanax 94m TID #90, +2 refills rx today for 3 month supply to continue BDZ for chronic anxiety with panic for now, reviewed again today chronic complications of long-term BDZ use and withdrawal risks, he will continue to gradually taper when ready 2. [facility] today, to be scanned to chart, temporary good for 1 year, 02/20/18, will plan on checking UDS in 3 months 3. Declined Duloxetine SNRI for anxiety/pain 4. Follow-up 3 months for Annual Physical also refill Xanax      Relevant Medications   ALPRAZolam (XANAX) 1 MG tablet      Meds ordered this encounter  Medications  . montelukast (SINGULAIR) 10 MG tablet    Sig: Take 1 tablet (10 mg total) by mouth at bedtime.    Dispense:  30 tablet    Refill:  5  . loratadine (CLARITIN) 10 MG tablet    Sig: Take 1 tablet (10 mg total) by mouth daily. Use for 4-6 weeks then stop, and use as needed or seasonally    Dispense:  30 tablet    Refill:  11  . ALPRAZolam (XANAX) 1 MG tablet    Sig: Take 1 tablet (1 mg total) by mouth 3 (three) times daily.    Dispense:  90 tablet    Refill:  2     Follow up plan: Return in about 3 months (around 05/23/2017) for Annual Physical.   ANobie Putnam DO [facility] 02/20/2017, 2:13 PM

## 2017-02-20 NOTE — Patient Instructions (Addendum)
Thank you for coming to the clinic today.  1. Refilled Xanax for 3 month supply  2. Asthma / Allergy - Stop Qvar (steroid) inhaler - Stop using Albuterol (ProAir) daily - ONLY use as needed 1-2 puffs every 4-6 hours IF short of breath wheezing, chest tightness, coughing  Start Monteukast (Singulair) 10mg  nightly for asthma and allergy maintenance Start Loratadine (generic Claritin) 10mg  daily - better if used BEFORE allergy symptoms and season starts Future consider Flonase as well in addition  You will be due for FASTING BLOOD WORK (no food or drink after midnight before, only water or coffee without cream/sugar on the morning of)  - Please go ahead and schedule a "Lab Only" visit in the morning at the clinic for lab draw in 3 months, before next Annual Physical - Make sure Lab Only appointment is at least 1-2 weeks before your next appointment, so that results will be available  For Lab Results, once available within 2-3 days of blood draw, you can can log in to MyChart online to view your results and a brief explanation. Also, we can discuss results at next follow-up visit.  Please schedule a follow-up appointment with Dr. Althea CharonKaramalegos in 3 months for Annual Physical, med refill  If you have any other questions or concerns, please feel free to call the clinic or send a message through MyChart. You may also schedule an earlier appointment if necessary.  Saralyn PilarAlexander Karamalegos, DO [facility], [facility]

## 2017-02-21 ENCOUNTER — Other Ambulatory Visit: Payer: Self-pay | Admitting: Family Medicine

## 2017-02-21 DIAGNOSIS — Z0000 Encounter for general adult medical examination without abnormal findings: Secondary | ICD-10-CM

## 2017-02-21 DIAGNOSIS — E875 Hyperkalemia: Secondary | ICD-10-CM

## 2017-02-21 DIAGNOSIS — Z114 Encounter for screening for human immunodeficiency virus [HIV]: Secondary | ICD-10-CM

## 2017-02-21 DIAGNOSIS — E559 Vitamin D deficiency, unspecified: Secondary | ICD-10-CM

## 2017-04-16 ENCOUNTER — Telehealth: Payer: Self-pay | Admitting: Family Medicine

## 2017-04-16 NOTE — Telephone Encounter (Signed)
I spoke with the pt and informed him that we do not prescribe abx without the pt first being evaluated in the office. He states that now he seems to be feeling better. I also informed him that sometimes even after being seen the provider still doesn't prescribe an abx, because it could be a viral infection. This doesn't require antibiotic. I recommended that he drink plenty of fluids and rest and if his symptoms worsens or persist he needs to go to the Urgent Care. He verbalize understanding, no questions or concerns.

## 2017-04-16 NOTE — Telephone Encounter (Signed)
Pt woke up with chills, fever, sore throat and nausea.  He asked if an antibiotic could be called in because he feels too bad to come in.  His [phone]

## 2017-05-02 ENCOUNTER — Other Ambulatory Visit: Payer: Self-pay

## 2017-05-02 DIAGNOSIS — J45909 Unspecified asthma, uncomplicated: Secondary | ICD-10-CM

## 2017-05-02 MED ORDER — FLUTICASONE PROPIONATE (INHAL) 50 MCG/BLIST IN AEPB: 1.0000 | INHALATION_SPRAY | Freq: Two times a day (BID) | RESPIRATORY_TRACT | 6 refills | Status: DC

## 2017-05-19 IMAGING — MR MR CERVICAL SPINE W/O CM
5 series · 34 of 48 positions shown · non-contrast
Comparison: None.

CLINICAL DATA: No known injury, pain in the neck. Right shoulder
pain

EXAM:
MRI CERVICAL SPINE WITHOUT CONTRAST
TECHNIQUE: Multiplanar, multisequence MR imaging of the cervical spine was
performed. No intravenous contrast was administered.

[Series 3: T1 · sagittal · 3.0mm · 0.70mm/px · 7 of 13 slices shown]
[im 1/13]
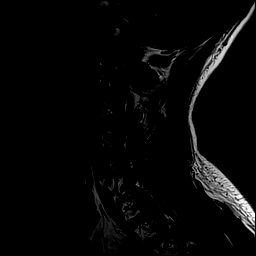
[im 3/13]
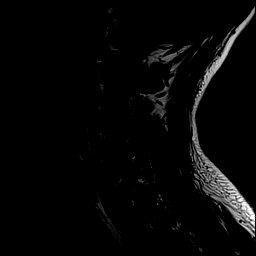
[im 5/13]
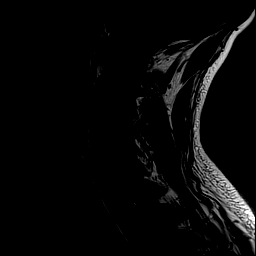
[im 7/13]
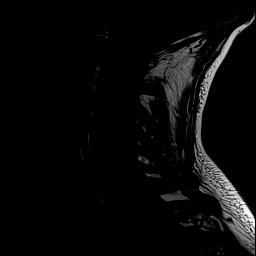
[im 9/13]
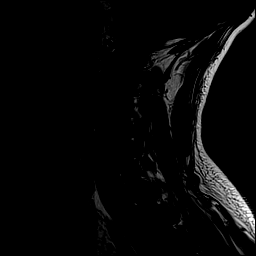
[im 11/13]
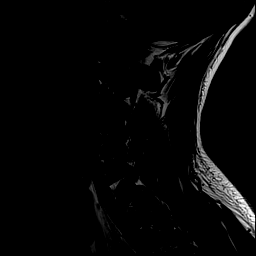
[im 13/13]
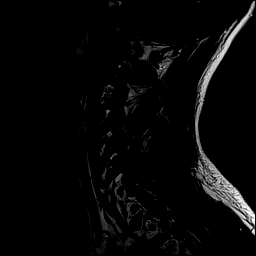

[Series 4: STIR · sagittal · 3.0mm · 0.35mm/px · 7 of 15 slices shown]
[im 1/15]
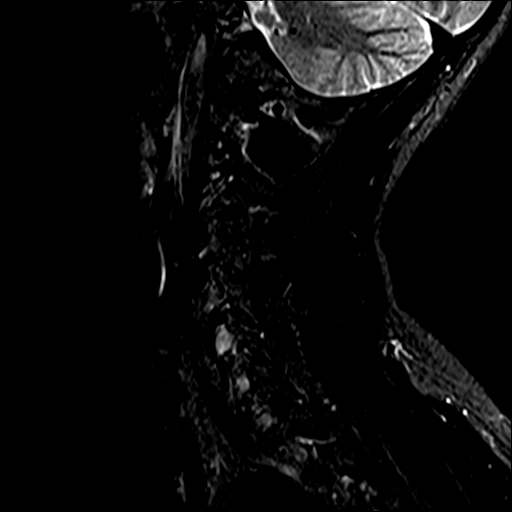
[im 3/15]
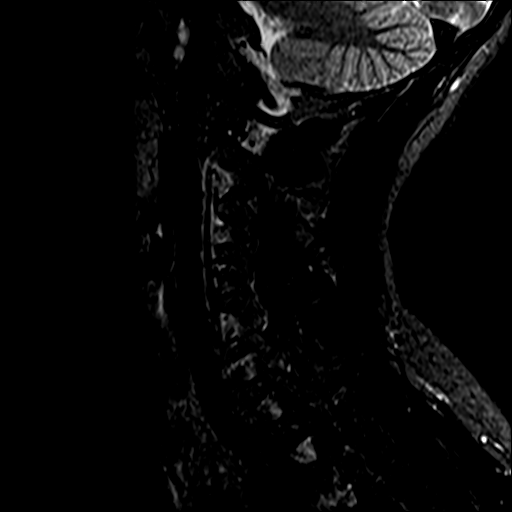
[im 5/15]
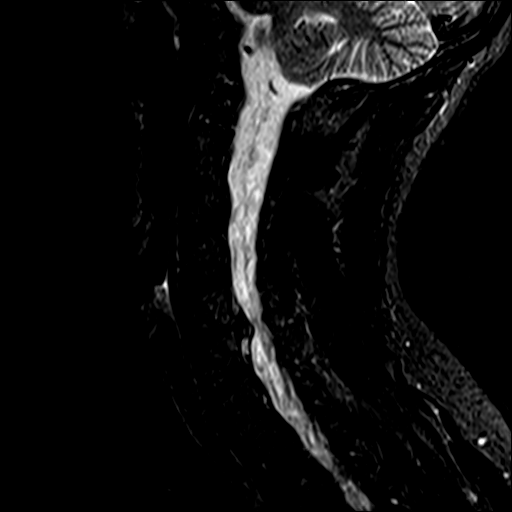
[im 8/15]
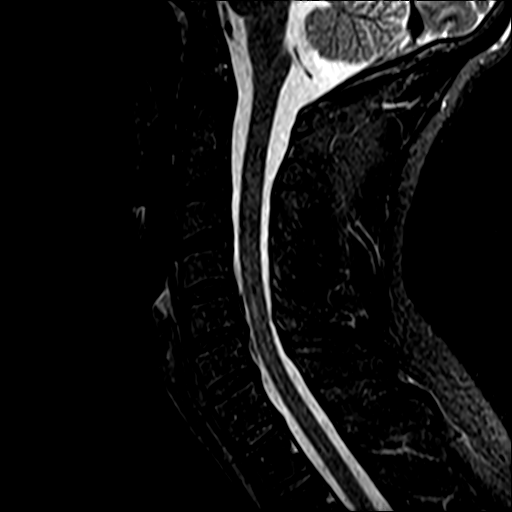
[im 10/15]
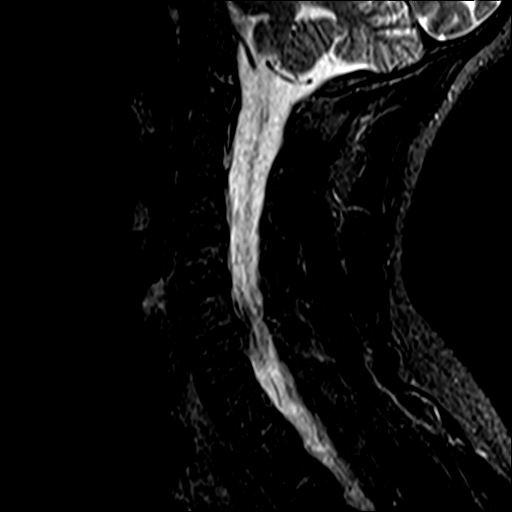
[im 12/15]
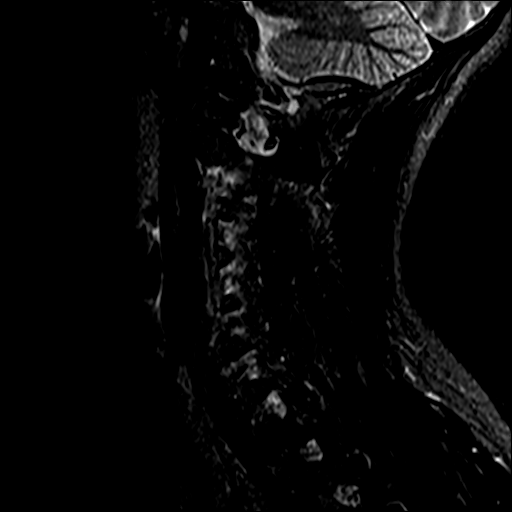
[im 15/15]
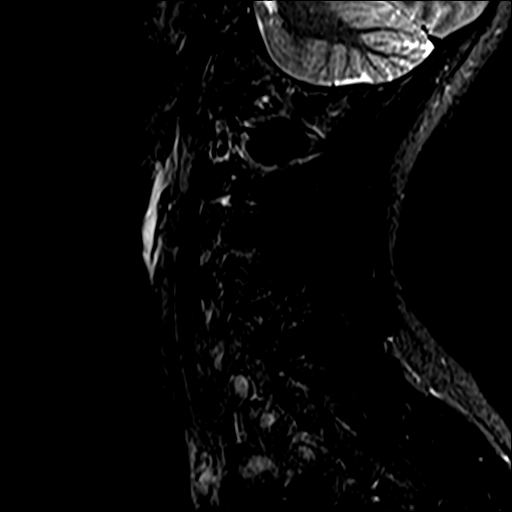

[Series 5: T2 · axial · 3.0mm · 0.70mm/px · z∈[-57,+45]mm · 8 of 28 slices shown (1 of 2)]
[im 1/28]
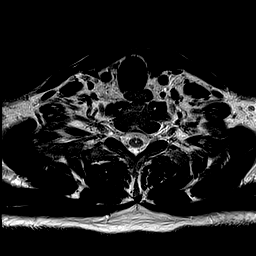
[im 5/28]
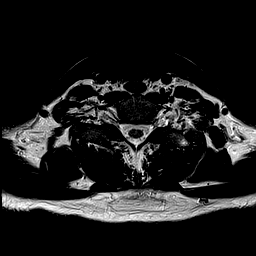
[im 9/28]
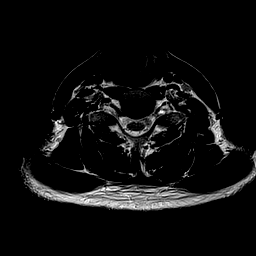
[im 13/28]
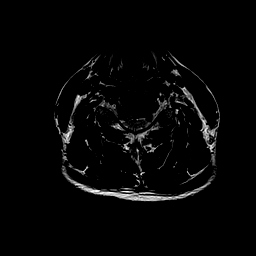
[im 15/28]
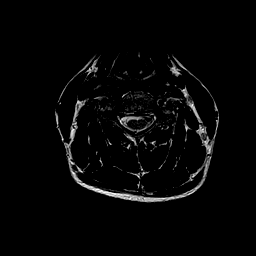
[im 19/28]
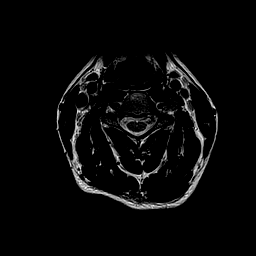
[im 23/28]
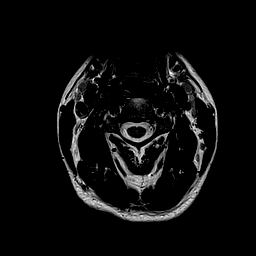
[im 28/28]
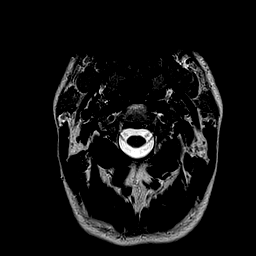

[Series 6: mpgr ax · axial · 3.0mm · 0.35mm/px · z∈[-57,+11]mm · 6 of 28 slices shown]
[im 1/28]
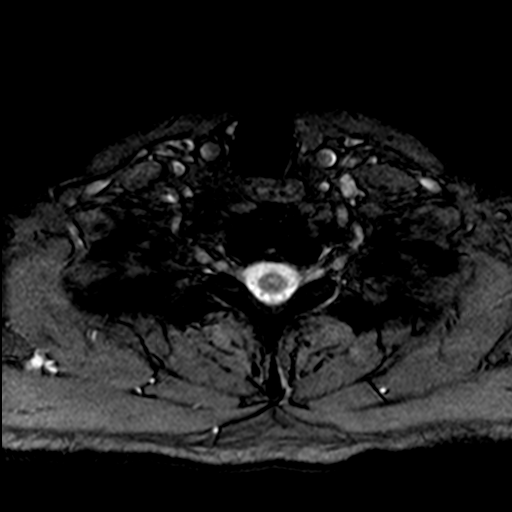
[im 5/28]
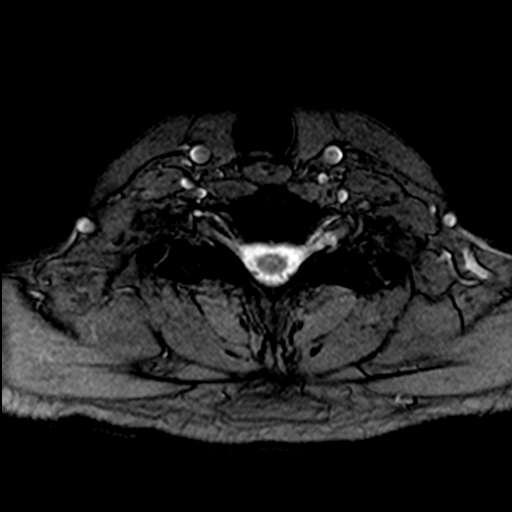
[im 9/28]
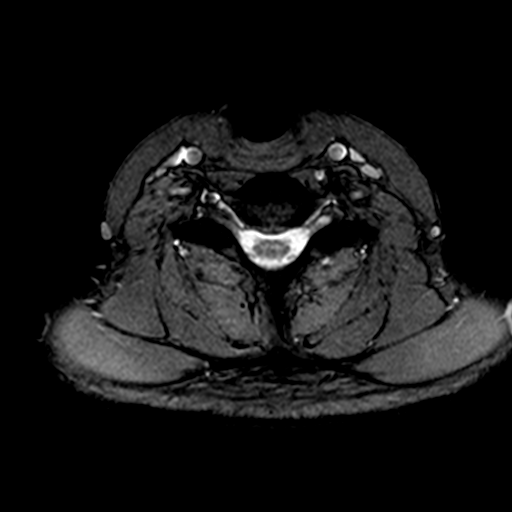
[im 13/28]
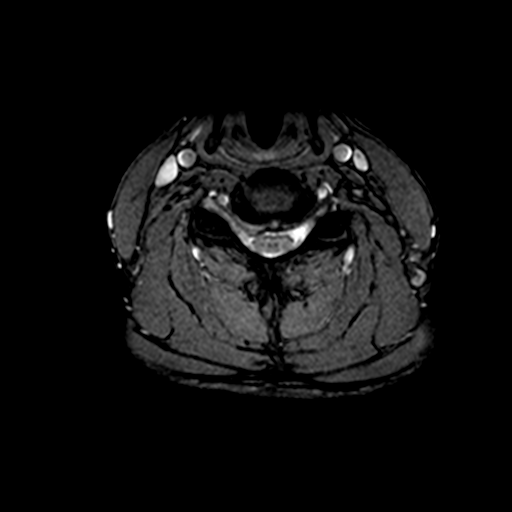
[im 15/28]
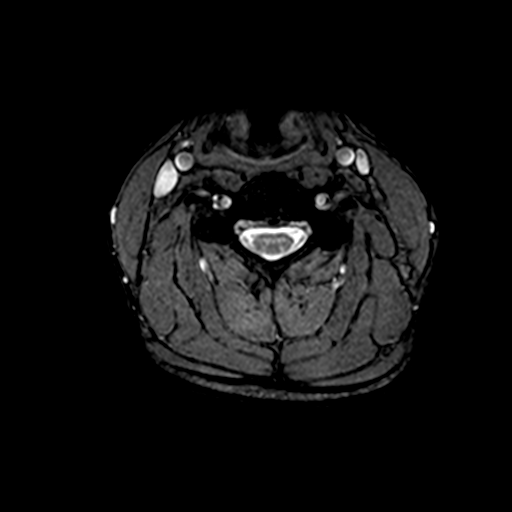
[im 19/28]
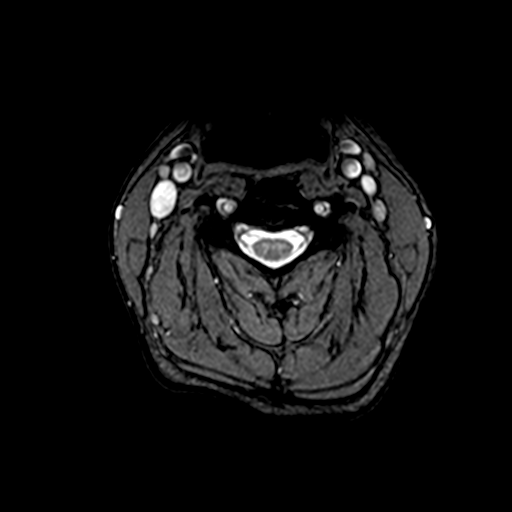

[Series 7: T2 · sagittal · 3.0mm · 0.70mm/px · 6 of 13 slices shown (2 of 2)]
[im 1/13]
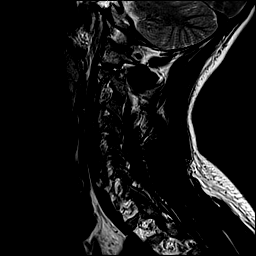
[im 3/13]
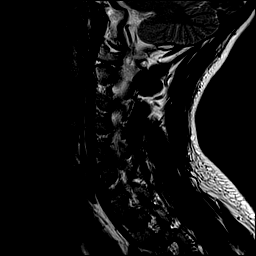
[im 5/13]
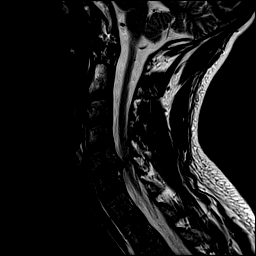
[im 8/13]
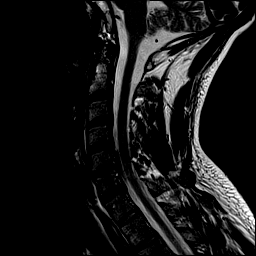
[im 10/13]
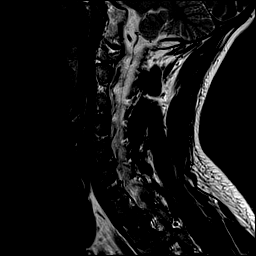
[im 13/13]
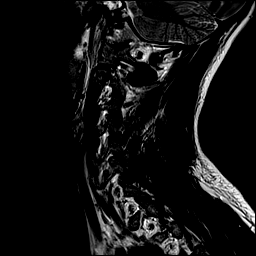

[34 of 48 positions shown; findings below may reference images not displayed]

FINDINGS: Alignment: Physiologic.

Vertebrae: No fracture, evidence of discitis, or bone lesion.

Cord: Normal signal and morphology.

Posterior Fossa, vertebral arteries, paraspinal tissues: Negative.

Disc levels:

Discs: Mild degenerative disc disease with disc height loss at C5-6.

C2-3: No significant disc bulge. No neural foraminal stenosis. No
central canal stenosis.

C3-4: No significant disc bulge. No neural foraminal stenosis. No
central canal stenosis.

C4-5: Small shallow central disc protrusion. No neural foraminal
stenosis. No central canal stenosis.

C5-6: Right paracentral disc protrusion which contacts the right
paracentral cervical spinal cord. No neural foraminal stenosis. No
central canal stenosis.

C6-7: Small central disc protrusion. No neural foraminal stenosis.
No central canal stenosis.

C7-T1: Tiny central disc protrusion. No neural foraminal stenosis.
No central canal stenosis.
IMPRESSION: 1. At C5-6 there is a right paracentral disc protrusion contacting
the right paracentral cervical spinal cord.

## 2017-05-23 ENCOUNTER — Other Ambulatory Visit: Payer: Self-pay | Admitting: Family Medicine

## 2017-05-23 DIAGNOSIS — F419 Anxiety disorder, unspecified: Secondary | ICD-10-CM

## 2017-05-23 DIAGNOSIS — E559 Vitamin D deficiency, unspecified: Secondary | ICD-10-CM

## 2017-05-23 DIAGNOSIS — Z114 Encounter for screening for human immunodeficiency virus [HIV]: Secondary | ICD-10-CM

## 2017-05-23 DIAGNOSIS — R799 Abnormal finding of blood chemistry, unspecified: Secondary | ICD-10-CM

## 2017-05-23 DIAGNOSIS — Z0000 Encounter for general adult medical examination without abnormal findings: Secondary | ICD-10-CM

## 2017-05-26 ENCOUNTER — Other Ambulatory Visit: Payer: Medicaid Other

## 2017-05-26 ENCOUNTER — Other Ambulatory Visit: Payer: Self-pay | Admitting: Family Medicine

## 2017-05-26 LAB — CBC WITH DIFFERENTIAL/PLATELET
Basophils Absolute: 0 cells/uL (ref 0–200)
Basophils Relative: 0 %
EOS ABS: 0 {cells}/uL — AB (ref 15–500)
Eosinophils Relative: 0 %
HEMATOCRIT: 45.2 % (ref 38.5–50.0)
HEMOGLOBIN: 15.7 g/dL (ref 13.2–17.1)
LYMPHS PCT: 11 %
Lymphs Abs: 1243 cells/uL (ref 850–3900)
MCH: 31.6 pg (ref 27.0–33.0)
MCHC: 34.7 g/dL (ref 32.0–36.0)
MCV: 90.9 fL (ref 80.0–100.0)
MONO ABS: 678 {cells}/uL (ref 200–950)
MPV: 10.3 fL (ref 7.5–12.5)
Monocytes Relative: 6 %
NEUTROS ABS: 9379 {cells}/uL — AB (ref 1500–7800)
NEUTROS PCT: 83 %
Platelets: 169 10*3/uL (ref 140–400)
RBC: 4.97 MIL/uL (ref 4.20–5.80)
RDW: 14.3 % (ref 11.0–15.0)
WBC: 11.3 10*3/uL — ABNORMAL HIGH (ref 3.8–10.8)

## 2017-05-27 LAB — HEMOGLOBIN A1C
HEMOGLOBIN A1C: 5 % (ref <5.7)
Mean Plasma Glucose: 97 mg/dL

## 2017-05-27 LAB — LIPID PANEL
Cholesterol: 125 mg/dL (ref <200)
HDL: 50 mg/dL (ref >40)
LDL CALC: 61 mg/dL (ref <100)
TRIGLYCERIDES: 72 mg/dL (ref <150)
Total CHOL/HDL Ratio: 2.5 Ratio (ref <5.0)
VLDL: 14 mg/dL (ref <30)

## 2017-05-27 LAB — COMPLETE METABOLIC PANEL WITH GFR
ALBUMIN: 4 g/dL (ref 3.6–5.1)
ALK PHOS: 71 U/L (ref 40–115)
ALT: 50 U/L — AB (ref 9–46)
AST: 33 U/L (ref 10–40)
BILIRUBIN TOTAL: 0.8 mg/dL (ref 0.2–1.2)
BUN: 8 mg/dL (ref 7–25)
CO2: 24 mmol/L (ref 20–32)
CREATININE: 0.85 mg/dL (ref 0.60–1.35)
Calcium: 9.1 mg/dL (ref 8.6–10.3)
Chloride: 103 mmol/L (ref 98–110)
GLUCOSE: 85 mg/dL (ref 65–99)
Potassium: 4 mmol/L (ref 3.5–5.3)
SODIUM: 139 mmol/L (ref 135–146)
TOTAL PROTEIN: 7.1 g/dL (ref 6.1–8.1)

## 2017-05-27 LAB — HIV ANTIBODY (ROUTINE TESTING W REFLEX): HIV: NONREACTIVE

## 2017-05-27 LAB — VITAMIN D 25 HYDROXY (VIT D DEFICIENCY, FRACTURES): VIT D 25 HYDROXY: 19 ng/mL — AB (ref 30–100)

## 2017-05-28 ENCOUNTER — Other Ambulatory Visit: Payer: Self-pay | Admitting: Family Medicine

## 2017-05-28 DIAGNOSIS — J45909 Unspecified asthma, uncomplicated: Secondary | ICD-10-CM

## 2017-05-28 MED ORDER — FLUTICASONE PROPIONATE (INHAL) 50 MCG/BLIST IN AEPB
1.0000 | INHALATION_SPRAY | Freq: Two times a day (BID) | RESPIRATORY_TRACT | 6 refills | Status: DC
Start: 2017-05-28 — End: 2018-01-06

## 2017-05-30 ENCOUNTER — Ambulatory Visit (INDEPENDENT_AMBULATORY_CARE_PROVIDER_SITE_OTHER): Payer: Medicaid Other | Admitting: Family Medicine

## 2017-05-30 ENCOUNTER — Encounter: Payer: Self-pay | Admitting: Family Medicine

## 2017-05-30 VITALS — BP 124/89 | HR 92 | Temp 98.7°F | Ht 71.0 in | Wt 171.2 lb

## 2017-05-30 DIAGNOSIS — K219 Gastro-esophageal reflux disease without esophagitis: Secondary | ICD-10-CM

## 2017-05-30 DIAGNOSIS — E559 Vitamin D deficiency, unspecified: Secondary | ICD-10-CM

## 2017-05-30 DIAGNOSIS — F419 Anxiety disorder, unspecified: Secondary | ICD-10-CM | POA: Diagnosis not present

## 2017-05-30 DIAGNOSIS — Z0000 Encounter for general adult medical examination without abnormal findings: Secondary | ICD-10-CM

## 2017-05-30 DIAGNOSIS — J4530 Mild persistent asthma, uncomplicated: Secondary | ICD-10-CM

## 2017-05-30 MED ORDER — BUSPIRONE HCL 7.5 MG PO TABS: 3.7500 mg | ORAL_TABLET | Freq: Every day | ORAL | 11 refills | Status: DC

## 2017-05-30 MED ORDER — VITAMIN D3 125 MCG (5000 UT) PO CAPS: 5000.0000 [IU] | ORAL_CAPSULE | Freq: Every day | ORAL | 0 refills | Status: DC

## 2017-05-30 MED ORDER — OMEPRAZOLE 40 MG PO CPDR: 40.0000 mg | DELAYED_RELEASE_CAPSULE | Freq: Every day | ORAL | 2 refills | Status: DC

## 2017-05-30 MED ORDER — ALPRAZOLAM 1 MG PO TABS: 1.0000 mg | ORAL_TABLET | Freq: Three times a day (TID) | ORAL | 2 refills | Status: DC

## 2017-05-30 NOTE — Assessment & Plan Note (Signed)
Stable, well controlled anxiety with panic, now without panic attacks - H/o >25 years, on xanax stable >5 years - Prior failed SSRI unsure - Checked [place] CSRS today 05/30/17 for 1 year, appropriate filling of xanax only  Plan: 1. Printed Xanax 1mg  TID #90, +2 refills rx today for 3 month supply to continue BDZ for chronic anxiety with panic for now, reviewed again today chronic complications of long-term BDZ use and withdrawal risks, he will continue to gradually taper when ready 2. [facility] for [facility] (from 02/20/17) 3. Obtained UDS today as discussed - check for any drug of abuse and confirm Xanax/BDZ appropriate use, last dose Xanax today 4. Follow-up 3 months med refill, can space to q 6 mo in future if no other concerns and remains stable, may print rx and he can pick up if need

## 2017-05-30 NOTE — Assessment & Plan Note (Signed)
Chronic asthma, mild intermittent vs mild persistent, without acute exac. - Consider GERD flare maybe worsening asthma - Trigger with environmental allergies, possibly some exercise induced  Plan: 1. Continue Singulair, Flovent, and Albuterol PRN 2. Loratadine daily for allergies 3. Increase PPI may help control asthma 4. Follow-up 3 months re-check med progress

## 2017-05-30 NOTE — Patient Instructions (Addendum)
Thank you for coming to the clinic today.  1. Changed Omeprazole from 20mg  to 40mg  daily as advised, try this for 4-6 weeks then taper back to 20mg  daily, may only need higher dose 40mg  in short intervals 2-4 weeks in the future - If you are improved and continue to use 40mg  daily or even have problems on the 40mg  daily we may consider second opinion from GI with a possible scope  2. Vitamin D Insufficiency - Start with Vitamin D3 5,000 iu daily for 12 weeks, then reduce to 1,000 to 2,000 iu daily for maintenance - We can check lab again in 1 year  3. Other lab results are good, blood sugar is excellent. Only minor abnormality was ALT liver enzyme 50 instead of 46  4. Refilled Xanax today for 3 month supply, check Urine drug screen  Please schedule a Follow-up Appointment to: Return in about 3 months (around 08/30/2017) for GERD, Anxiety/Refills.  If you have any other questions or concerns, please feel free to call the clinic or send a message through MyChart. You may also schedule an earlier appointment if necessary.  Additionally, you may be receiving a survey about your experience at our clinic within a few days to 1 week by e-mail or mail. We value your feedback.  Saralyn PilarAlexander Nori Poland, DO [facility] [place]

## 2017-05-30 NOTE — Assessment & Plan Note (Signed)
Gradual worsening subacute on chronic flare worsening GERD, food triggers and other unsure. - No GI red flag symptoms. Exam is unremarkable with benign abdomen without pain today - Chronic history of GERD, only intermittent  Plan: 1. Start increased dose Omeprazole 40mg  daily 30 min prior to 1st meal for 4 to 6 weeks, may need repeat in future, then titrate down to 20mg  daily, reviewed rebound hypersecretion, consider repeat trials of 40 for 2-4 weeks again, may need long term 2. Diet modifications reduce GERD 3. Future follow-up consider GI if not improving

## 2017-05-30 NOTE — Progress Notes (Signed)
Subjective:    Patient ID: Jeffery King, male    DOB: 1976/04/27, 41 y.o.   MRN: [id number]  Jeffery King is a 41 y.o. male presenting on 05/30/2017 for Annual Exam (pt still having some break through acid reflux. x3 weeks.)   HPI   Here for Annual Physical and Lab Result Review. He did not check results in MyChart.  Vitamin D Insufficiency - Mildly low Vitamin D 19, on recent lab result. He has not been on Vitamin D supplement before.  GERD: - Last visit focusing on GERD was 09/03/16, seen for variety of complaints including GERD, was improved off of PPI 89m daily. He has since restarted Omeprazole 253mdaily with some recent flare of worsening heartburn symptoms, and does not seem to be controlling, he was advised by pharmacist to try 4059maily - Admits to occasional inc frequency heartburn, dyspepsia symptoms in mouth/throat, and some mild epigastric discomfort at times. Food triggers worse. - No prior history of GI evaluation  FOLLOW-UP ANXIETY with Panic Attacks, Chronic: - [facility] this problem [facility], see note for background information on chronic history >25 years of anxiety. 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 - He has been using Flovent maintenance regularly, and rare PRN Albuterol - He has concerns with GERD maybe affecting his asthma, would like higher dose PPI  Declined PHQ. Did  not answer complete GAD.  Health Maintenance: - Due for flu shot in Fall 2018 - UTD routine HIV screen, negative - UTD TDap  Depression screen PHQ 2/9 03/28/2016  Decreased Interest 2  Down, Depressed, Hopeless 1  PHQ - 2 Score 3  Altered sleeping 1  Tired, decreased energy 2  Change in appetite 2  Feeling bad or failure about yourself  2  Trouble concentrating 2  Moving slowly or fidgety/restless 0  Suicidal thoughts 0  PHQ-9 Score 12  Difficult doing work/chores Not difficult at all   GAD 7 : Generalized Anxiety Score 05/30/2017 11/13/2016 08/01/2016 04/30/2016  Nervous, Anxious, on Edge 0 (No Data) 0 0  Control/stop worrying 1 - - 3  Worry too much - different things 0 - - 3  Trouble relaxing 0 - - 0  Restless 0 - - 0  Easily annoyed or irritable 0 - - 0  Afraid - awful might happen 0 - - 0  Total GAD 7 Score 1 - - 6  Anxiety Difficulty Not difficult at all - - Somewhat difficult    Past Medical History:  Diagnosis Date  . Anxiety   . Asthma    No past surgical history on file. Social History   Social History  . Marital status: Single    Spouse name: N/A  . Number of children: N/A  . Years of education: N/A   Occupational History  . Not on file.   Social History Main Topics  . Smoking status: Former  Smoker    Quit date: 05/30/2014  . Smokeless tobacco: Former Systems developer  . Alcohol use No  . Drug use: No  . Sexual activity: Not on file   Other Topics Concern  . Not on file   Social History Narrative  . No narrative on file   Family History  Problem Relation Age of Onset  . Hypertension Mother   . Hypertension Father   . Hypertension Brother    Current Outpatient Prescriptions on File Prior to Visit  Medication Sig  . albuterol (PROAIR HFA) 108 (90 Base) MCG/ACT inhaler Inhale 2 puffs into the lungs every 4 (four) hours as needed for wheezing or shortness of breath.  . fluticasone (FLOVENT DISKUS) 50 MCG/BLIST diskus inhaler Inhale 1 puff into the lungs 2  (two) times daily.  Marland Kitchen loratadine (CLARITIN) 10 MG tablet Take 1 tablet (10 mg total) by mouth daily. Use for 4-6 weeks then stop, and use as needed or seasonally  . montelukast (SINGULAIR) 10 MG tablet Take 1 tablet (10 mg total) by mouth at bedtime.   No current facility-administered medications on file prior to visit.     Review of Systems  Constitutional: Negative for activity change, appetite change, chills, diaphoresis, fatigue and fever.  HENT: Negative for congestion, hearing loss and sinus pressure.   Eyes: Negative for visual disturbance.  Respiratory: Negative for apnea, cough, chest tightness, shortness of breath and wheezing.   Cardiovascular: Negative for chest pain, palpitations and leg swelling.  Gastrointestinal: Negative for abdominal pain, anal bleeding, blood in stool, constipation, diarrhea, nausea and vomiting.       Heartburn worsening  Endocrine: Negative for cold intolerance and polyuria.  Genitourinary: Negative for dysuria, frequency and hematuria.  Musculoskeletal: Negative for arthralgias, back pain, neck pain (Improved R sided) and neck stiffness.  Skin: Negative for rash.  Allergic/Immunologic: Positive for environmental allergies (Improved).  Neurological: Negative for dizziness, weakness, light-headedness, numbness and headaches.  Hematological: Negative for adenopathy.  Psychiatric/Behavioral: Negative for behavioral problems, dysphoric mood and sleep disturbance. The patient is nervous/anxious (Improved).    Per HPI unless specifically indicated above     Objective:    BP 124/89 (BP Location: Left Arm, Patient Position: Sitting, Cuff Size: Normal)   Pulse 92   Temp 98.7 F (37.1 C) (Oral)   Ht 5' 11"  (1.803 m)   Wt 171 lb 3.2 oz (77.7 kg)   SpO2 98%   BMI 23.88 kg/m   Wt Readings from Last 3 Encounters:  05/30/17 171 lb 3.2 oz (77.7 kg)  02/20/17 167 lb (75.8 kg)  12/26/16 166 lb (75.3 kg)    Physical Exam  Constitutional: He is oriented  to person, place, and time. He appears well-developed and well-nourished. No distress.  Well-appearing, comfortable, cooperative  HENT:  Head: Normocephalic and atraumatic.  Mouth/Throat: Oropharynx is clear and moist.  Eyes: Pupils are equal, round, and reactive to light. Conjunctivae and EOM are normal. Right eye exhibits no discharge. Left eye exhibits no discharge.  Neck: Normal range of motion. Neck supple. No thyromegaly present.  Cardiovascular: Normal rate, regular rhythm, normal heart sounds and intact distal pulses.   No murmur heard. Pulmonary/Chest: Effort normal and breath sounds normal. No respiratory distress. He has no wheezes. He has no rales.  Abdominal: Soft. Bowel sounds are normal. He exhibits no distension and no mass. There is no tenderness.  Musculoskeletal: Normal range of motion. He exhibits no edema or tenderness.  Upper / Lower Extremities: - Normal muscle tone, strength bilateral  upper extremities 5/5, lower extremities 5/5  Lymphadenopathy:    He has no cervical adenopathy.  Neurological: He is alert and oriented to person, place, and time.  Distal sensation intact to light touch all extremities  Skin: Skin is warm and dry. No rash noted. He is not diaphoretic. No erythema.  Psychiatric: He has a normal mood and affect. His behavior is normal.  Well groomed, good eye contact, normal speech and thoughts. Not anxious appearing today.  Nursing note and vitals reviewed.  Results for orders placed or performed in visit on 05/26/17  COMPLETE METABOLIC PANEL WITH GFR  Result Value Ref Range   Sodium 139 135 - 146 mmol/L   Potassium 4.0 3.5 - 5.3 mmol/L   Chloride 103 98 - 110 mmol/L   CO2 24 20 - 32 mmol/L   Glucose, Bld 85 65 - 99 mg/dL   BUN 8 7 - 25 mg/dL   Creat 0.85 0.60 - 1.35 mg/dL   Total Bilirubin 0.8 0.2 - 1.2 mg/dL   Alkaline Phosphatase 71 40 - 115 U/L   AST 33 10 - 40 U/L   ALT 50 (H) 9 - 46 U/L   Total Protein 7.1 6.1 - 8.1 g/dL   Albumin 4.0  3.6 - 5.1 g/dL   Calcium 9.1 8.6 - 10.3 mg/dL   GFR, Est African American >89 >=60 mL/min   GFR, Est Non African American >89 >=60 mL/min  CBC with Differential/Platelet  Result Value Ref Range   WBC 11.3 (H) 3.8 - 10.8 K/uL   RBC 4.97 4.20 - 5.80 MIL/uL   Hemoglobin 15.7 13.2 - 17.1 g/dL   HCT 45.2 38.5 - 50.0 %   MCV 90.9 80.0 - 100.0 fL   MCH 31.6 27.0 - 33.0 pg   MCHC 34.7 32.0 - 36.0 g/dL   RDW 14.3 11.0 - 15.0 %   Platelets 169 140 - 400 K/uL   MPV 10.3 7.5 - 12.5 fL   Neutro Abs 9,379 (H) 1,500 - 7,800 cells/uL   Lymphs Abs 1,243 850 - 3,900 cells/uL   Monocytes Absolute 678 200 - 950 cells/uL   Eosinophils Absolute 0 (L) 15 - 500 cells/uL   Basophils Absolute 0 0 - 200 cells/uL   Neutrophils Relative % 83 %   Lymphocytes Relative 11 %   Monocytes Relative 6 %   Eosinophils Relative 0 %   Basophils Relative 0 %   Smear Review Criteria for review not met   Lipid panel  Result Value Ref Range   Cholesterol 125 <200 mg/dL   Triglycerides 72 <150 mg/dL   HDL 50 >40 mg/dL   Total CHOL/HDL Ratio 2.5 <5.0 Ratio   VLDL 14 <30 mg/dL   LDL Cholesterol 61 <100 mg/dL  Hemoglobin A1c  Result Value Ref Range   Hgb A1c MFr Bld 5.0 <5.7 %   Mean Plasma Glucose 97 mg/dL  HIV antibody  Result Value Ref Range   HIV 1&2 Ab, 4th Generation NONREACTIVE NONREACTIVE  VITAMIN D 25 Hydroxy (Vit-D Deficiency, Fractures)  Result Value Ref Range   Vit D, 25-Hydroxy 19 (L) 30 - 100 ng/mL      Assessment & Plan:   Problem List Items Addressed This Visit    Vitamin D insufficiency    Low vit d 39 Start OTC Vitamin D3 5,000 iu daily for 12 weeks then may reduce to 1-2,000 iu daily maintenance Re-check q 1 yr      Relevant Medications   Cholecalciferol (VITAMIN D3) 5000 units  CAPS   Mild persistent asthma    Chronic asthma, mild intermittent vs mild persistent, without acute exac. - Consider GERD flare maybe worsening asthma - Trigger with environmental allergies, possibly some  exercise induced  Plan: 1. Continue Singulair, Flovent, and Albuterol PRN 2. Loratadine daily for allergies 3. Increase PPI may help control asthma 4. Follow-up 3 months re-check med progress      Gastroesophageal reflux disease without esophagitis    Gradual worsening subacute on chronic flare worsening GERD, food triggers and other unsure. - No GI red flag symptoms. Exam is unremarkable with benign abdomen without pain today - Chronic history of GERD, only intermittent  Plan: 1. Start increased dose Omeprazole 56m daily 30 min prior to 1st meal for 4 to 6 weeks, may need repeat in future, then titrate down to 227mdaily, reviewed rebound hypersecretion, consider repeat trials of 40 for 2-4 weeks again, may need long term 2. Diet modifications reduce GERD 3. Future follow-up consider GI if not improving      Relevant Medications   omeprazole (PRILOSEC) 40 MG capsule   Anxiety    Stable, well controlled anxiety with panic, now without panic attacks - H/o >25 years, on xanax stable >5 years - Prior failed SSRI unsure - Checked  CSRS today 05/30/17 for 1 year, appropriate filling of xanax only  Plan: 1. Printed Xanax 21m521mID #90, +2 refills rx today for 3 month supply to continue BDZ for chronic anxiety with panic for now, reviewed again today chronic complications of long-term BDZ use and withdrawal risks, he will continue to gradually taper when ready 2. [facility] (from 02/20/17) 3. Obtained UDS today as discussed - check for any drug of abuse and confirm Xanax/BDZ appropriate use, last dose Xanax today 4. Follow-up 3 months med refill, can space to q 6 mo in future if no other concerns and remains stable, may print rx and he can pick up if need      Relevant Medications   ALPRAZolam (XANAX) 1 MG tablet   busPIRone (BUSPAR) 7.5 MG tablet    Other Visit Diagnoses    Annual physical exam    -  Primary      Meds ordered this encounter    Medications  . omeprazole (PRILOSEC) 40 MG capsule    Sig: Take 1 capsule (40 mg total) by mouth daily before breakfast. For 4-6 weeks then may reduce to 55m43mily.    Dispense:  30 capsule    Refill:  2  . Cholecalciferol (VITAMIN D3) 5000 units CAPS    Sig: Take 1 capsule (5,000 Units total) by mouth daily. For 8 weeks, then start Vitamin D3 2,000 units daily (OTC)    Dispense:  60 capsule    Refill:  0  . ALPRAZolam (XANAX) 1 MG tablet    Sig: Take 1 tablet (1 mg total) by mouth 3 (three) times daily.    Dispense:  90 tablet    Refill:  2  . busPIRone (BUSPAR) 7.5 MG tablet    Sig: Take 0.5 tablets (3.75 mg total) by mouth daily.    Dispense:  15 tablet    Refill:  11    Follow up plan: Return in about 3 months (around 08/30/2017) for GERD, Anxiety/Refills.  AlexNobie Putnam [place] 05/30/2017, 10:52 AM

## 2017-05-30 NOTE — Assessment & Plan Note (Signed)
Low vit d 7119 Start OTC Vitamin D3 5,000 iu daily for 12 weeks then may reduce to 1-2,000 iu daily maintenance Re-check q 1 yr

## 2017-06-04 ENCOUNTER — Encounter: Payer: Self-pay | Admitting: Family Medicine

## 2017-08-20 ENCOUNTER — Other Ambulatory Visit: Payer: Self-pay | Admitting: Family Medicine

## 2017-08-20 DIAGNOSIS — F419 Anxiety disorder, unspecified: Secondary | ICD-10-CM

## 2017-08-21 NOTE — Telephone Encounter (Signed)
Last office visit 05/30/17, see note, for chronic anxiety rx xanax 1mg  TID #90 +2 refills = 3 month supply at that visit, given stable on this med, and normal UDS, we agreed can space out to q 6 month visits, checked [place] CSRS within past 2 years, appropriate feeling. Printed new rx for Xanax today.  Please notify patient he may either pick up rx or we can fax it to [facility].  Jeffery PilarAlexander Legion Discher, DO [facility] 08/21/2017, 8:17 AM

## 2017-09-17 ENCOUNTER — Encounter: Payer: Self-pay | Admitting: Family Medicine

## 2017-09-17 ENCOUNTER — Ambulatory Visit (INDEPENDENT_AMBULATORY_CARE_PROVIDER_SITE_OTHER): Payer: Medicaid Other | Admitting: Family Medicine

## 2017-09-17 VITALS — BP 120/88 | HR 94 | Temp 98.3°F | Resp 16 | Ht 71.0 in | Wt 182.0 lb

## 2017-09-17 DIAGNOSIS — R1012 Left upper quadrant pain: Secondary | ICD-10-CM

## 2017-09-17 DIAGNOSIS — R1011 Right upper quadrant pain: Secondary | ICD-10-CM | POA: Insufficient documentation

## 2017-09-17 DIAGNOSIS — M5030 Other cervical disc degeneration, unspecified cervical region: Secondary | ICD-10-CM

## 2017-09-17 DIAGNOSIS — F419 Anxiety disorder, unspecified: Secondary | ICD-10-CM

## 2017-09-17 DIAGNOSIS — K219 Gastro-esophageal reflux disease without esophagitis: Secondary | ICD-10-CM | POA: Diagnosis not present

## 2017-09-17 MED ORDER — ALPRAZOLAM 1 MG PO TABS
1.0000 mg | ORAL_TABLET | Freq: Three times a day (TID) | ORAL | 2 refills | Status: DC
Start: 2017-09-17 — End: 2017-12-11

## 2017-09-17 MED ORDER — OMEPRAZOLE 40 MG PO CPDR: 40.0000 mg | DELAYED_RELEASE_CAPSULE | Freq: Every day | ORAL | 2 refills | Status: DC

## 2017-09-17 MED ORDER — DICYCLOMINE HCL 10 MG PO CAPS: 10.0000 mg | ORAL_CAPSULE | Freq: Three times a day (TID) | ORAL | 2 refills | Status: DC

## 2017-09-17 NOTE — Patient Instructions (Addendum)
Thank you for coming to the clinic today.  1. For GERD stomach acid - try to continue 40mg  omeprazole daily for 6 more weeks - new rx sent, then try taper down every other day on 20, then down to 20 every day, if can tolerate then continue this and let me know can new rx 20mg . Otherwise we will continue 40mg .  Your upper abdominal discomfort seems to be more related to anxiety and stress, and possibly IBS overall.  May try OTC Gas-X or other meds as needed to try.  2. Printed refill Xanax - today for 90 day supply  3. In future may need referral to GI ifsymptoms not improved  4. Contact me within few weeks if neck pain and radiating symptoms not improved or worsening - we can refer you back to [facility] Dr Martha ClanKrasinski  Please schedule a Follow-up Appointment to: Return in about 3 months (around 12/18/2017) for GERD med adjust / Upper abd, Anxiety GAD refill.  If you have any other questions or concerns, please feel free to call the clinic or send a message through MyChart. You may also schedule an earlier appointment if necessary.  Additionally, you may be receiving a survey about your experience at our clinic within a few days to 1 week by e-mail or mail. We value your feedback.  Saralyn PilarAlexander Karamalegos, DO [facility]

## 2017-09-17 NOTE — Assessment & Plan Note (Signed)
Now with mild acute flare MSK with pain, otherwise prior stable chronic problem No new treatment at this time, gradual improvement If worsening may notify office consider referral back to [facility] Martha ClanKrasinski

## 2017-09-17 NOTE — Assessment & Plan Note (Signed)
Stable, well controlled anxiety with panic, now without panic attacks - H/o >25 years, on xanax stable >5 years - Prior failed SSRI unsure - Checked [place] CSRS today for 2 year, appropriate filling of xanax only  Plan: 1. Printed Xanax 1mg  TID #90, +2 refills rx today for 3 month supply to continue BDZ for chronic anxiety with panic for now, reviewed again today chronic complications of long-term BDZ use and withdrawal risks, he will continue to gradually taper when ready 2. Follow-up 3 months med refill, can space to q 6 mo in future if no other concerns and remains stable, may print rx and he can pick up if need

## 2017-09-17 NOTE — Progress Notes (Signed)
Subjective:    Patient ID: BARNARD SHARPS, male    DOB: 31-Oct-1975, 41 y.o.   MRN: [id number]  Jeffery King is a 41 y.o. male presenting on 09/17/2017 for Anxiety   HPI   FOLLOW-UP ANXIETY with Panic Attacks, Chronic: - Last visit for this problem 05/30/17, remains stable on Xanax, see note for background information on chronic history >25 years of anxiety. - He continues to do well on current therapy Xanax 18m TID (denies withdrawal symptoms or change of use) and Buspar HALF tab 7.561m= dose 3.7549maily - No concerns with meds today - Due for refills - Denies panic attacks since last visit  UPPLugoffReports tightness and squeezing in upper abdomen, bilateral, intermittent for several months, now worsening in past few weeks. Related to feeling better if standing up, worse if sitting down and slouching. Not related to exercise or exertion. Not related to eating. Tried TUMs and Pepto OTC, some relief. - Admits some fluctuation of constipation / diarrhea, no prior dx of IBS. Sometimes pain relief related to bowel movements. - Also seems worse with anxiety - Denies chest pain or tightness, dyspnea, exertional component, sweating  GERD: - Last visit with me 05/30/17, for same problem, treated with increased Omeprazole from 20 to 58m27mee prior notes for background information. - Interval update with notable improvement on GERD heartburn symptoms - Today patient reports he was unable to reduce dose, continues at 58mg60m does endorse some dyspepsia and mild regurgitation or acid at times, but no more burning, see above for additional concern for upper abdominal discomfort - No prior history of GI evaluation or EGD - Admits poor diet at times - Denies abdominal pain, nausea vomiting  Additional complaints / updates today: - Initial BP elevated today on electronic cuff, he has no dx HTN. Occasional reading in past elevated, he is concerned. Repeat improved. - He also  endorses with his chronic neck pain, that he did a side job recently, and had problem with lifting his arm and neck pain flare with muscle spasm x 3 days ago with localized R neck symptoms some radiation down his R arm, seems stable to improved, not worsening, he states may need to return to [facility] Dr KrasiMack Guiseuture  Health Maintenance: - Due for Flu Shot, declines today despite counseling on benefits  Depression screen PHQ 2/9 03/28/2016  Decreased Interest 2  Down, Depressed, Hopeless 1  PHQ - 2 Score 3  Altered sleeping 1  Tired, decreased energy 2  Change in appetite 2  Feeling bad or failure about yourself  2  Trouble concentrating 2  Moving slowly or fidgety/restless 0  Suicidal thoughts 0  PHQ-9 Score 12  Difficult doing work/chores Not difficult at all    Social History   Tobacco Use  . Smoking status: Former Smoker    Last attempt to quit: 05/30/2014    Years since quitting: 3.3  . Smokeless tobacco: Former User Network engineerTopics  . Alcohol use: No  . Drug use: No    Review of Systems Per HPI unless specifically indicated above     Objective:    BP 120/88 (BP Location: Left Arm, Cuff Size: Normal)   Pulse 94   Temp 98.3 F (36.8 C) (Oral)   Resp 16   Ht 5' 11"  (1.803 m)   Wt 182 lb (82.6 kg)   BMI 25.38 kg/m   Wt Readings from Last 3 Encounters:  09/17/17  182 lb (82.6 kg)  05/30/17 171 lb 3.2 oz (77.7 kg)  02/20/17 167 lb (75.8 kg)    Physical Exam  Constitutional: He is oriented to person, place, and time. He appears well-developed and well-nourished. No distress.  Well-appearing, comfortable, cooperative  HENT:  Head: Normocephalic and atraumatic.  Mouth/Throat: Oropharynx is clear and moist.  Eyes: Conjunctivae are normal. Right eye exhibits no discharge. Left eye exhibits no discharge.  Neck: Normal range of motion. Neck supple.  Cardiovascular: Normal rate, regular rhythm, normal heart sounds and intact distal pulses.  No murmur  heard. Pulmonary/Chest: Effort normal and breath sounds normal. No respiratory distress. He has no wheezes. He has no rales.  Abdominal: Soft. Bowel sounds are normal. He exhibits no distension and no mass. There is no tenderness. There is no rebound.  RUQ Murphy's negative  Musculoskeletal: Normal range of motion. He exhibits no edema.  Neurological: He is alert and oriented to person, place, and time.  Skin: Skin is warm and dry. No rash noted. He is not diaphoretic. No erythema.  Psychiatric: He has a normal mood and affect. His behavior is normal.  Well groomed, good eye contact, normal speech and thoughts. Stable slightly anxious appearing  Nursing note and vitals reviewed.  Results for orders placed or performed in visit on 05/26/17  COMPLETE METABOLIC PANEL WITH GFR  Result Value Ref Range   Sodium 139 135 - 146 mmol/L   Potassium 4.0 3.5 - 5.3 mmol/L   Chloride 103 98 - 110 mmol/L   CO2 24 20 - 32 mmol/L   Glucose, Bld 85 65 - 99 mg/dL   BUN 8 7 - 25 mg/dL   Creat 0.85 0.60 - 1.35 mg/dL   Total Bilirubin 0.8 0.2 - 1.2 mg/dL   Alkaline Phosphatase 71 40 - 115 U/L   AST 33 10 - 40 U/L   ALT 50 (H) 9 - 46 U/L   Total Protein 7.1 6.1 - 8.1 g/dL   Albumin 4.0 3.6 - 5.1 g/dL   Calcium 9.1 8.6 - 10.3 mg/dL   GFR, Est African American >89 >=60 mL/min   GFR, Est Non African American >89 >=60 mL/min  CBC with Differential/Platelet  Result Value Ref Range   WBC 11.3 (H) 3.8 - 10.8 K/uL   RBC 4.97 4.20 - 5.80 MIL/uL   Hemoglobin 15.7 13.2 - 17.1 g/dL   HCT 45.2 38.5 - 50.0 %   MCV 90.9 80.0 - 100.0 fL   MCH 31.6 27.0 - 33.0 pg   MCHC 34.7 32.0 - 36.0 g/dL   RDW 14.3 11.0 - 15.0 %   Platelets 169 140 - 400 K/uL   MPV 10.3 7.5 - 12.5 fL   Neutro Abs 9,379 (H) 1,500 - 7,800 cells/uL   Lymphs Abs 1,243 850 - 3,900 cells/uL   Monocytes Absolute 678 200 - 950 cells/uL   Eosinophils Absolute 0 (L) 15 - 500 cells/uL   Basophils Absolute 0 0 - 200 cells/uL   Neutrophils Relative %  83 %   Lymphocytes Relative 11 %   Monocytes Relative 6 %   Eosinophils Relative 0 %   Basophils Relative 0 %   Smear Review Criteria for review not met   Lipid panel  Result Value Ref Range   Cholesterol 125 <200 mg/dL   Triglycerides 72 <150 mg/dL   HDL 50 >40 mg/dL   Total CHOL/HDL Ratio 2.5 <5.0 Ratio   VLDL 14 <30 mg/dL   LDL Cholesterol 61 <100 mg/dL  Hemoglobin  A1c  Result Value Ref Range   Hgb A1c MFr Bld 5.0 <5.7 %   Mean Plasma Glucose 97 mg/dL  HIV antibody  Result Value Ref Range   HIV 1&2 Ab, 4th Generation NONREACTIVE NONREACTIVE  VITAMIN D 25 Hydroxy (Vit-D Deficiency, Fractures)  Result Value Ref Range   Vit D, 25-Hydroxy 19 (L) 30 - 100 ng/mL      Assessment & Plan:   Problem List Items Addressed This Visit    Anxiety    Stable, well controlled anxiety with panic, now without panic attacks - H/o >25 years, on xanax stable >5 years - Prior failed SSRI unsure - Checked [place] CSRS today for 2 year, appropriate filling of xanax only  Plan: 1. Printed Xanax 64m TID #90, +2 refills rx today for 3 month supply to continue BDZ for chronic anxiety with panic for now, reviewed again today chronic complications of long-term BDZ use and withdrawal risks, he will continue to gradually taper when ready 2. Follow-up 3 months med refill, can space to q 6 mo in future if no other concerns and remains stable, may print rx and he can pick up if need      Relevant Medications   ALPRAZolam (XANAX) 1 MG tablet   Bilateral upper abdominal discomfort    Presumed symptoms of possible IBS given history Not consistent with upper chest cardiac or exertional symptoms. Not consistent clinically with other concerning GI symptoms, no RUQ no symptoms related to eating, unlikely cholelithiasis, PUD. - Could be related to GERD and poor diet also likely component of anxiety  Plan: 1. Trial improve diet 2. OTC meds Gas-X, TUMs Pepto, miralax. probiotic 3. Trial rx Dicyclomine PRN 4.  Follow-up within 3 months review progress - may need referral GI in future      Relevant Medications   dicyclomine (BENTYL) 10 MG capsule   Degenerative disc disease, cervical    Now with mild acute flare MSK with pain, otherwise prior stable chronic problem No new treatment at this time, gradual improvement If worsening may notify office consider referral back to [facility] Dr KMack Guise     Gastroesophageal reflux disease without esophagitis - Primary    Improved GERD, still some mixed symptoms - No GI red flag symptoms - Chronic history of GERD  Plan: 1. Continue Omeprazole 40 for 6 weeks then consider alternating taper 278mqod, until can transition down to 2085maily 2. Diet modifications reduce GERD 3. Future follow-up consider GI if not improving      Relevant Medications   omeprazole (PRILOSEC) 40 MG capsule   dicyclomine (BENTYL) 10 MG capsule      Meds ordered this encounter  Medications  . omeprazole (PRILOSEC) 40 MG capsule    Sig: Take 1 capsule (40 mg total) by mouth daily before breakfast. For 4-6 weeks then may reduce to 43m47mily.    Dispense:  30 capsule    Refill:  2  . dicyclomine (BENTYL) 10 MG capsule    Sig: Take 1 capsule (10 mg total) by mouth 4 (four) times daily -  before meals and at bedtime.    Dispense:  40 capsule    Refill:  2  . ALPRAZolam (XANAX) 1 MG tablet    Sig: Take 1 tablet (1 mg total) by mouth 3 (three) times daily.    Dispense:  90 tablet    Refill:  2     Follow up plan: Return in about 3 months (around 12/18/2017) for GERD  med adjust / Upper abd, Anxiety GAD refill.  [name], [facility] 09/17/2017, 2:44 PM

## 2017-09-17 NOTE — Assessment & Plan Note (Signed)
Improved GERD, still some mixed symptoms - No GI red flag symptoms - Chronic history of GERD  Plan: 1. Continue Omeprazole 40 for 6 weeks then consider alternating taper 20mg  qod, until can transition down to 20mg  daily 2. Diet modifications reduce GERD 3. Future follow-up consider GI if not improving

## 2017-09-17 NOTE — Assessment & Plan Note (Signed)
Presumed symptoms of possible IBS given history Not consistent with upper chest cardiac or exertional symptoms. Not consistent clinically with other concerning GI symptoms, no RUQ no symptoms related to eating, unlikely cholelithiasis, PUD. - Could be related to GERD and poor diet also likely component of anxiety  Plan: 1. Trial improve diet 2. OTC meds Gas-X, TUMs Pepto, miralax. probiotic 3. Trial rx Dicyclomine PRN 4. Follow-up within 3 months review progress - may need referral GI in future

## 2017-11-14 ENCOUNTER — Other Ambulatory Visit: Payer: Self-pay | Admitting: Family Medicine

## 2017-11-14 DIAGNOSIS — J4530 Mild persistent asthma, uncomplicated: Secondary | ICD-10-CM

## 2017-12-11 ENCOUNTER — Other Ambulatory Visit: Payer: Self-pay | Admitting: Family Medicine

## 2017-12-11 DIAGNOSIS — F419 Anxiety disorder, unspecified: Secondary | ICD-10-CM

## 2017-12-22 ENCOUNTER — Ambulatory Visit (INDEPENDENT_AMBULATORY_CARE_PROVIDER_SITE_OTHER): Payer: Medicaid Other | Admitting: Family Medicine

## 2017-12-22 ENCOUNTER — Encounter: Payer: Self-pay | Admitting: Family Medicine

## 2017-12-22 ENCOUNTER — Other Ambulatory Visit: Payer: Self-pay | Admitting: Family Medicine

## 2017-12-22 VITALS — BP 132/80 | HR 85 | Temp 98.3°F | Resp 16 | Ht 71.0 in | Wt 177.0 lb

## 2017-12-22 DIAGNOSIS — E559 Vitamin D deficiency, unspecified: Secondary | ICD-10-CM

## 2017-12-22 DIAGNOSIS — R7989 Other specified abnormal findings of blood chemistry: Secondary | ICD-10-CM

## 2017-12-22 DIAGNOSIS — M5030 Other cervical disc degeneration, unspecified cervical region: Secondary | ICD-10-CM

## 2017-12-22 DIAGNOSIS — F419 Anxiety disorder, unspecified: Secondary | ICD-10-CM

## 2017-12-22 DIAGNOSIS — R1012 Left upper quadrant pain: Secondary | ICD-10-CM

## 2017-12-22 DIAGNOSIS — K219 Gastro-esophageal reflux disease without esophagitis: Secondary | ICD-10-CM

## 2017-12-22 DIAGNOSIS — R1011 Right upper quadrant pain: Secondary | ICD-10-CM | POA: Diagnosis not present

## 2017-12-22 DIAGNOSIS — Z0000 Encounter for general adult medical examination without abnormal findings: Secondary | ICD-10-CM

## 2017-12-22 DIAGNOSIS — R799 Abnormal finding of blood chemistry, unspecified: Secondary | ICD-10-CM

## 2017-12-22 MED ORDER — NAPROXEN 500 MG PO TABS
500.0000 mg | ORAL_TABLET | Freq: Two times a day (BID) | ORAL | 0 refills | Status: DC
Start: 2017-12-22 — End: 2018-02-20

## 2017-12-22 MED ORDER — BACLOFEN 10 MG PO TABS: 5.0000 mg | ORAL_TABLET | Freq: Three times a day (TID) | ORAL | 1 refills | Status: DC | PRN

## 2017-12-22 NOTE — Assessment & Plan Note (Signed)
Improved GERD, still some mixed symptoms. Seems stable to controlled, still req PPI - No GI red flag symptoms - Chronic history of GERD  Plan: 1. Continue Omeprazole 40mg  longer - may use daily for now or can try intermittent QOD dosing in future if need 2. Diet modifications reduce GERD 3. Future follow-up consider GI if not improving

## 2017-12-22 NOTE — Assessment & Plan Note (Signed)
Improved in interval on dicyclomine intermittent dosing and diet improve Likely related to GERD and possible underlying IBS w/ anxiety  Plan: 1. Continue Dicyclomine PRN 2. Continue improving diet 3. OTC meds as needed 4. Follow-up with GI in future if refractory

## 2017-12-22 NOTE — Patient Instructions (Addendum)
Thank you for coming to the office today.  1.  Refilled Xanax on 12/11/17 - for 3 month supply - notify office when close for next refill and we can send it electronically 3 months  Signed new controlled agreement today for 1 year  2. Continue Omeprazole 40mg  - may continue with alternating doses as needed -or can continue daily in future  Continue Dicyclomine  Regarding neck - rx trial on either Naproxen usually 1-2 weeks every day start daily to see how you tolerate it - then can increase to twice daily if need for 1-2 weeks then AS NEEDED  Start taking Baclofen (Lioresal) 10mg  (muscle relaxant) - start with half (cut) to one whole pill at night as needed for next 1-3 nights (may make you drowsy, caution with driving) see how it affects you, then if tolerated increase to one pill 2 to 3 times a day or (every 8 hours as needed)  3.  DUE for FASTING BLOOD WORK (no food or drink after midnight before the lab appointment, only water or coffee without cream/sugar on the morning of)  SCHEDULE "Lab Only" visit in the morning at the clinic for lab draw in 6 MONTHS   - Make sure Lab Only appointment is at about 1 week before your next appointment, so that results will be available  For Lab Results, once available within 2-3 days of blood draw, you can can log in to MyChart online to view your results and a brief explanation. Also, we can discuss results at next follow-up visit.   Please schedule a Follow-up Appointment to: Return in about 6 months (around 06/24/2018) for Annual Physical.    If you have any other questions or concerns, please feel free to call the office or send a message through MyChart. You may also schedule an earlier appointment if necessary.  Additionally, you may be receiving a survey about your experience at our office within a few days to 1 week by e-mail or mail. We value your feedback.  Saralyn PilarAlexander Anabela Crayton, DO [facility]

## 2017-12-22 NOTE — Progress Notes (Signed)
Subjective:    Patient ID: Jeffery King, male    DOB: [birth year], 42 y.o.   MRN: [id number]  Jeffery King is a 42 y.o. male presenting on 12/22/2017 for Gastroesophageal Reflux and Neck Pain   HPI   FOLLOW-UP ANXIETY with Panic Attacks, Chronic: - Last visit for this problem 09/17/17,remains stable on current Xanax and Buspar,see note for background information on chronic history >25 years of anxiety. - Today reports no new concerns, last fill Xanax 12/11/17 on request - He continues to do well on current therapy Xanax 41m TID(denies withdrawal symptoms or change of use)and BusparHALF tab7.533m dose 3.7579mily - No concerns with meds today - Denies panic attacks since last visit  Follow-up GERD and UPPER ABDOMINAL DISCOMFORT - Last visit with me 09/17/17, for same problem, treated with continued higher dose PPI Omeprazole 40 and also give rx Dicyclomine for some functional GI symptoms, see prior notes for background information. - Today patient reports improvement in abdominal discomfort and cramping and gas on Dicyclomine, he takes this still 1-3 times weekly with good results, not due for refill - Regarding GERD, he states he has been taking Omeprazole 82m96mily - occasionally has skipped some doses and used every other day, he has not returned to lower dose, still has heartburn but it is more controlled now - Seems worse with anxiety - Improved diet has helped control his GERD, no longer eating spicy foods - Sometimes positional he has tried to sleep sitting up in chair with good results - Taking TUMs OTC PRN minor flare ups - No prior history of GI evaluation or EGD - Denies chest pain or tightness, dyspnea, exertional component, sweating, abdominal pain, nausea vomiting  FOLLOW-UP CERVICAL DJD with Radiculopathy, Chronic Neck Pain - Last visit 02/2017 that we have focused on same problem, see chart for background information for this chronic problem - Today reports some  recent gradual worse again with these symptoms, he had been doing better, but often has had flare ups due to minor muscle strains or activities, usually playing with children or doing other activities outside - He admits R > L regarding discomfort, has some mild constant aching without the radiating sharp pains that he has had in the past - Previously he has tried various NSAIDs with some issues, worse on ibuprofen, he has tolerated Naproxen before only admits mild lip tingling but has had this with other meds and even off meds, he would like to try this course again with rx Naproxen trial, short term - He has been on Baclofen muscle relaxant before as well he cannot recall if this significantly helped and would like to consider this option as well - He has been followed by [facility] Dr KrasMack Guisepast but not ready to return yet - Denies significant persistent weakness, numbness, tingling, new injury  Elevated BP without dx HTN Reports that this was a concern previously, and had mild elevated reading today on initial BP check electronically. He is not monitoring BP outside office.  Additional update: - recent URI vs bronchitis, had chest congestion, some pressure, then resolved.  Depression screen PHQ 2/9 03/28/2016  Decreased Interest 2  Down, Depressed, Hopeless 1  PHQ - 2 Score 3  Altered sleeping 1  Tired, decreased energy 2  Change in appetite 2  Feeling bad or failure about yourself  2  Trouble concentrating 2  Moving slowly or fidgety/restless 0  Suicidal thoughts 0  PHQ-9 Score 12  Difficult doing work/chores  Not difficult at all    Social History   Tobacco Use  . Smoking status: Former Smoker    Last attempt to quit: 05/30/2014    Years since quitting: 3.5  . Smokeless tobacco: Former Network engineer Use Topics  . Alcohol use: No  . Drug use: No    Review of Systems Per HPI unless specifically indicated above     Objective:    BP 132/80 (BP Location: Left  Arm, Cuff Size: Normal)   Pulse 85   Temp 98.3 F (36.8 C) (Oral)   Resp 16   Ht 5' 11"  (1.803 m)   Wt 177 lb (80.3 kg)   BMI 24.69 kg/m   Wt Readings from Last 3 Encounters:  12/22/17 177 lb (80.3 kg)  09/17/17 182 lb (82.6 kg)  05/30/17 171 lb 3.2 oz (77.7 kg)    Physical Exam  Constitutional: He is oriented to person, place, and time. He appears well-developed and well-nourished. No distress.  Well-appearing, comfortable, cooperative  HENT:  Head: Normocephalic and atraumatic.  Mouth/Throat: Oropharynx is clear and moist.  Mild dry appearing lips  Eyes: Conjunctivae are normal. Right eye exhibits no discharge. Left eye exhibits no discharge.  Neck: Neck supple.  Neck - similar to prior exams Inspection: No significant abnormality, mild paraspinal cervical muscle hypertonicity R>L Palpation: Non-tender today ROM: Mildly reduced with Right rotation, some limited flex/ext  Cardiovascular: Normal rate, regular rhythm, normal heart sounds and intact distal pulses.  No murmur heard. Pulmonary/Chest: Effort normal. No respiratory distress. He has no wheezes. He has no rales.  Good air movement. Speaks full sentences  Abdominal: Soft. Bowel sounds are normal. He exhibits no distension. There is no tenderness.  Neurological: He is alert and oriented to person, place, and time.  Skin: Skin is warm and dry. No rash noted. He is not diaphoretic. No erythema.  Psychiatric: He has a normal mood and affect. His behavior is normal.  Well groomed, good eye contact, normal speech and thoughts. Stable mildly anxious  Nursing note and vitals reviewed.  Results for orders placed or performed in visit on 05/26/17  COMPLETE METABOLIC PANEL WITH GFR  Result Value Ref Range   Sodium 139 135 - 146 mmol/L   Potassium 4.0 3.5 - 5.3 mmol/L   Chloride 103 98 - 110 mmol/L   CO2 24 20 - 32 mmol/L   Glucose, Bld 85 65 - 99 mg/dL   BUN 8 7 - 25 mg/dL   Creat 0.85 0.60 - 1.35 mg/dL   Total Bilirubin  0.8 0.2 - 1.2 mg/dL   Alkaline Phosphatase 71 40 - 115 U/L   AST 33 10 - 40 U/L   ALT 50 (H) 9 - 46 U/L   Total Protein 7.1 6.1 - 8.1 g/dL   Albumin 4.0 3.6 - 5.1 g/dL   Calcium 9.1 8.6 - 10.3 mg/dL   GFR, Est African American >89 >=60 mL/min   GFR, Est Non African American >89 >=60 mL/min  CBC with Differential/Platelet  Result Value Ref Range   WBC 11.3 (H) 3.8 - 10.8 K/uL   RBC 4.97 4.20 - 5.80 MIL/uL   Hemoglobin 15.7 13.2 - 17.1 g/dL   HCT 45.2 38.5 - 50.0 %   MCV 90.9 80.0 - 100.0 fL   MCH 31.6 27.0 - 33.0 pg   MCHC 34.7 32.0 - 36.0 g/dL   RDW 14.3 11.0 - 15.0 %   Platelets 169 140 - 400 K/uL   MPV 10.3 7.5 -  12.5 fL   Neutro Abs 9,379 (H) 1,500 - 7,800 cells/uL   Lymphs Abs 1,243 850 - 3,900 cells/uL   Monocytes Absolute 678 200 - 950 cells/uL   Eosinophils Absolute 0 (L) 15 - 500 cells/uL   Basophils Absolute 0 0 - 200 cells/uL   Neutrophils Relative % 83 %   Lymphocytes Relative 11 %   Monocytes Relative 6 %   Eosinophils Relative 0 %   Basophils Relative 0 %   Smear Review Criteria for review not met   Lipid panel  Result Value Ref Range   Cholesterol 125 <200 mg/dL   Triglycerides 72 <150 mg/dL   HDL 50 >40 mg/dL   Total CHOL/HDL Ratio 2.5 <5.0 Ratio   VLDL 14 <30 mg/dL   LDL Cholesterol 61 <100 mg/dL  Hemoglobin A1c  Result Value Ref Range   Hgb A1c MFr Bld 5.0 <5.7 %   Mean Plasma Glucose 97 mg/dL  HIV antibody  Result Value Ref Range   HIV 1&2 Ab, 4th Generation NONREACTIVE NONREACTIVE  VITAMIN D 25 Hydroxy (Vit-D Deficiency, Fractures)  Result Value Ref Range   Vit D, 25-Hydroxy 19 (L) 30 - 100 ng/mL      Assessment & Plan:   Problem List Items Addressed This Visit    Anxiety    Stable, well controlled anxiety with panic, now without panic attacks - H/o >25 years, on xanax stable >5 years - Prior failed SSRI unsure - Checked [place] CSRS today for 2 year, appropriate filling of xanax only  Plan: 1. Reviewed controlled rx policies and printed /  signed new [place] Controlled Contract for update since last set to expire in 2 months - now good for 1 year, dated 12/23/2018 - Recently refilled Xanax already have sent e-script 61m TID #90 + 2 refills for 3 months - Will refill upon request by phone in 3 months then review at 6 month annual phys - Continue Buspar low dose half tab daily      Bilateral upper abdominal discomfort    Improved in interval on dicyclomine intermittent dosing and diet improve Likely related to GERD and possible underlying IBS w/ anxiety  Plan: 1. Continue Dicyclomine PRN 2. Continue improving diet 3. OTC meds as needed 4. Follow-up with GI in future if refractory      Degenerative disc disease, cervical - Primary    Concern mild flare-up chronic neck pain with MSK and some history of nerve impingement C-DJD No obvious radicular symptoms currently No sign of weakness or other neurological complication  Plan Reassurance Reviewed med options - agree with trial NSAID short term - reviewed his extensive allergy and he requests to try Naproxen and understands risks regarding this allergy, has had some mild lip tingling in past also with other meds and spontaneously, he is aware of signs / symptoms to look for if worsening allergic reaction and when to stop med, only try for BID 1-2 weeks then PRN - Also rx refill Baclofen PRN muscle relaxant for current flare - Follow-up as needed if not improving, recommend may need to return back to [facility] Dr KMack Guiseif still limited improvement      Relevant Medications   naproxen (NAPROSYN) 500 MG tablet   baclofen (LIORESAL) 10 MG tablet   Gastroesophageal reflux disease without esophagitis    Improved GERD, still some mixed symptoms. Seems stable to controlled, still req PPI - No GI red flag symptoms - Chronic history of GERD  Plan: 1. Continue Omeprazole 44m  longer - may use daily for now or can try intermittent QOD dosing in future if need 2. Diet  modifications reduce GERD 3. Future follow-up consider GI if not improving         Meds ordered this encounter  Medications  . naproxen (NAPROSYN) 500 MG tablet    Sig: Take 1 tablet (500 mg total) by mouth 2 (two) times daily with a meal. For 2-4 weeks then as needed    Dispense:  60 tablet    Refill:  0    Patient believes he has tolerated this medicine better in past compared to other NSAIDs, he is willing try again  . baclofen (LIORESAL) 10 MG tablet    Sig: Take 0.5-1 tablets (5-10 mg total) by mouth 3 (three) times daily as needed for muscle spasms.    Dispense:  30 each    Refill:  1      Follow up plan: Return in about 6 months (around 06/24/2018) for Annual Physical.  Future labs ordered for 06/2018  [name], [facility] 12/22/2017, 1:22 PM

## 2017-12-22 NOTE — Assessment & Plan Note (Addendum)
Concern mild flare-up chronic neck pain with MSK and some history of nerve impingement C-DJD No obvious radicular symptoms currently No sign of weakness or other neurological complication  Plan Reassurance Reviewed med options - agree with trial NSAID short term - reviewed his extensive allergy and he requests to try Naproxen and understands risks regarding this allergy, has had some mild lip tingling in past also with other meds and spontaneously, he is aware of signs / symptoms to look for if worsening allergic reaction and when to stop med, only try for BID 1-2 weeks then PRN - Also rx refill Baclofen PRN muscle relaxant for current flare - Follow-up as needed if not improving, recommend may need to return back to [facility] Dr Martha ClanKrasinski if still limited improvement

## 2017-12-22 NOTE — Assessment & Plan Note (Signed)
Stable, well controlled anxiety with panic, now without panic attacks - H/o >25 years, on xanax stable >5 years - Prior failed SSRI unsure - Checked [place] CSRS today for 2 year, appropriate filling of xanax only  Plan: 1. Reviewed controlled rx policies and printed / signed [facility] for update since last set to expire in 2 months - now good for 1 year, dated 12/23/2018 - Recently refilled Xanax already have sent e-script 1mg  TID #90 + 2 refills for 3 months - Will refill upon request by phone in 3 months then review at 6 month annual phys - Continue Buspar low dose half tab daily

## 2018-01-05 ENCOUNTER — Telehealth: Payer: Self-pay | Admitting: Family Medicine

## 2018-01-05 DIAGNOSIS — F419 Anxiety disorder, unspecified: Secondary | ICD-10-CM

## 2018-01-05 DIAGNOSIS — M5030 Other cervical disc degeneration, unspecified cervical region: Secondary | ICD-10-CM

## 2018-01-05 DIAGNOSIS — J3089 Other allergic rhinitis: Secondary | ICD-10-CM

## 2018-01-05 DIAGNOSIS — R1011 Right upper quadrant pain: Secondary | ICD-10-CM

## 2018-01-05 DIAGNOSIS — R1012 Left upper quadrant pain: Secondary | ICD-10-CM

## 2018-01-05 DIAGNOSIS — J4530 Mild persistent asthma, uncomplicated: Secondary | ICD-10-CM

## 2018-01-05 DIAGNOSIS — K219 Gastro-esophageal reflux disease without esophagitis: Secondary | ICD-10-CM

## 2018-01-05 DIAGNOSIS — J45909 Unspecified asthma, uncomplicated: Secondary | ICD-10-CM

## 2018-01-05 NOTE — Telephone Encounter (Signed)
Pt. Called requesting ALL medication be called into  [facility] in [place]

## 2018-01-06 MED ORDER — FLUTICASONE PROPIONATE (INHAL) 50 MCG/BLIST IN AEPB: 1.0000 | INHALATION_SPRAY | Freq: Two times a day (BID) | RESPIRATORY_TRACT | 3 refills | Status: DC

## 2018-01-06 MED ORDER — OMEPRAZOLE 40 MG PO CPDR: 40.0000 mg | DELAYED_RELEASE_CAPSULE | Freq: Every day | ORAL | 1 refills | Status: DC

## 2018-01-06 MED ORDER — MONTELUKAST SODIUM 10 MG PO TABS: 10.0000 mg | ORAL_TABLET | Freq: Every day | ORAL | 3 refills | Status: DC

## 2018-01-06 MED ORDER — BUSPIRONE HCL 7.5 MG PO TABS: 3.7500 mg | ORAL_TABLET | Freq: Every day | ORAL | 3 refills | Status: DC

## 2018-01-06 MED ORDER — DICYCLOMINE HCL 10 MG PO CAPS: 10.0000 mg | ORAL_CAPSULE | Freq: Three times a day (TID) | ORAL | 2 refills | Status: DC

## 2018-01-06 MED ORDER — LORATADINE 10 MG PO TABS: 10.0000 mg | ORAL_TABLET | Freq: Every day | ORAL | 3 refills | Status: DC

## 2018-01-06 NOTE — Telephone Encounter (Signed)
Refilled all meds, sent for 90 day supply with refills  Did not refill Alprazolam (Xanax) not due yet, and did not refill new acute meds, Baclofen, Naproxen  Saralyn PilarAlexander Karamalegos, DO [facility] 01/06/2018, 12:31 PM

## 2018-01-08 ENCOUNTER — Other Ambulatory Visit: Payer: Self-pay | Admitting: Family Medicine

## 2018-01-08 DIAGNOSIS — J4530 Mild persistent asthma, uncomplicated: Secondary | ICD-10-CM

## 2018-01-08 MED ORDER — FLUTICASONE PROPIONATE HFA 44 MCG/ACT IN AERO: 1.0000 | INHALATION_SPRAY | Freq: Two times a day (BID) | RESPIRATORY_TRACT | 3 refills | Status: DC

## 2018-02-19 ENCOUNTER — Telehealth: Payer: Self-pay | Admitting: Family Medicine

## 2018-02-19 NOTE — Telephone Encounter (Signed)
Patient advised to be seen by Dr. Kirtland Bouchard and also advised to go to ER or urgent care if he gets worst today.

## 2018-02-19 NOTE — Telephone Encounter (Signed)
Pt asked for a phone call to discuss referral to gastro [phone]

## 2018-02-20 ENCOUNTER — Ambulatory Visit (INDEPENDENT_AMBULATORY_CARE_PROVIDER_SITE_OTHER): Payer: Medicaid Other | Admitting: Family Medicine

## 2018-02-20 ENCOUNTER — Encounter: Payer: Self-pay | Admitting: Family Medicine

## 2018-02-20 VITALS — BP 141/91 | HR 86 | Temp 98.8°F | Resp 16 | Ht 71.0 in | Wt 184.0 lb

## 2018-02-20 DIAGNOSIS — K219 Gastro-esophageal reflux disease without esophagitis: Secondary | ICD-10-CM | POA: Diagnosis not present

## 2018-02-20 DIAGNOSIS — R42 Dizziness and giddiness: Secondary | ICD-10-CM

## 2018-02-20 DIAGNOSIS — R1012 Left upper quadrant pain: Secondary | ICD-10-CM

## 2018-02-20 DIAGNOSIS — R0789 Other chest pain: Secondary | ICD-10-CM

## 2018-02-20 DIAGNOSIS — R1011 Right upper quadrant pain: Secondary | ICD-10-CM

## 2018-02-20 LAB — COMPLETE METABOLIC PANEL WITH GFR
AG Ratio: 1.3 (calc) (ref 1.0–2.5)
ALT: 21 U/L (ref 9–46)
AST: 18 U/L (ref 10–40)
Albumin: 4 g/dL (ref 3.6–5.1)
Alkaline phosphatase (APISO): 98 U/L (ref 40–115)
BUN: 11 mg/dL (ref 7–25)
CALCIUM: 9.6 mg/dL (ref 8.6–10.3)
CO2: 32 mmol/L (ref 20–32)
CREATININE: 0.94 mg/dL (ref 0.60–1.35)
Chloride: 106 mmol/L (ref 98–110)
GFR, EST AFRICAN AMERICAN: 115 mL/min/{1.73_m2} (ref >=60)
GFR, EST NON AFRICAN AMERICAN: 100 mL/min/{1.73_m2} (ref >=60)
GLOBULIN: 3.1 g/dL (ref 1.9–3.7)
Glucose, Bld: 95 mg/dL (ref 65–99)
Potassium: 5 mmol/L (ref 3.5–5.3)
Sodium: 142 mmol/L (ref 135–146)
TOTAL PROTEIN: 7.1 g/dL (ref 6.1–8.1)
Total Bilirubin: 0.4 mg/dL (ref 0.2–1.2)

## 2018-02-20 LAB — CBC
HEMATOCRIT: 42.8 % (ref 38.5–50.0)
Hemoglobin: 14.9 g/dL (ref 13.2–17.1)
MCH: 30.7 pg (ref 27.0–33.0)
MCHC: 34.8 g/dL (ref 32.0–36.0)
MCV: 88.1 fL (ref 80.0–100.0)
MPV: 11.1 fL (ref 7.5–12.5)
Platelets: 189 10*3/uL (ref 140–400)
RBC: 4.86 10*6/uL (ref 4.20–5.80)
RDW: 12.5 % (ref 11.0–15.0)
WBC: 6 10*3/uL (ref 3.8–10.8)

## 2018-02-20 LAB — LIPASE: Lipase: 48 U/L (ref 7–60)

## 2018-02-20 MED ORDER — SUCRALFATE 1 G PO TABS: 1.0000 g | ORAL_TABLET | Freq: Three times a day (TID) | ORAL | 1 refills | Status: DC

## 2018-02-20 NOTE — Progress Notes (Signed)
Subjective:    Patient ID: Jeffery King, male    DOB: 1976/06/08, 42 y.o.   MRN: [id number]  Jeffery King is a 42 y.o. male presenting on 02/20/2018 for Chest Pain (ongoing from past as per patient feels like ballon blows up in chest and feels tightness, SOB some dizzyness, bloating, Regurgitation ) and ankle numbness (fall yesterday nose, upper lips and fingers feels tingling onset 2 months)   HPI   Chest Tightness and Discomfort / GERD / Dizziness / Abdominal Discomfort Today he returns for acute worsening of symptoms, he was last seen in 12/2017 for more routine follow-up was doing better ,was on Omeprazole 66m daily. - Now he is complaining of worsening symptoms seem to be present for few weeks to days with chest discomfort and pain, his GERD symptoms seem worse and back to where it was before, he has "tightness" in chest and "balloon" in chest feels. Symptoms with some shortness of breath, worse with persistent talking and he needs to take a break. He feels also slight tingling on tip of nose and times and some weakness in legs and he has some dizziness and had a fall yesterday felt ankles weak and gave out - He is taking now Omeprazole 468mTWICE daily for past week, with some improvement in acid reflux but still has the other symptoms. He had episode of regurgitation last week and felt somewhat better - He still admits upper abdominal burning - He was on dicyclomine in past but did not help him - He would like to see GI doctor in referral and consider a Endoscopy as we have mentioned to him in the past as well - He has never had problem with Cardiac issue never had EKG or other cardiac testing. No - Seems worse with anxiety. Worse with recent family stressors - Sometimes positional he has tried to sleep sitting up in chair with good results - Admits fatigue - Denies fever chills, vomiting, focal weakness in arm or leg, slurred speech headache vision changes  PMH - Anxiety with panic  attacks, chronic - currently remains on Xanax therapy and Buspar. No change to this, does not endorse recent worse anxiety, but does admit to several worse life stressors recently, family member in hospital.   [facility]  Decreased Interest 2  Down, Depressed, Hopeless 1  PHQ - 2 Score 3  Altered sleeping 1  Tired, decreased energy 2  Change in appetite 2  Feeling bad or failure about yourself  2  Trouble concentrating 2  Moving slowly or fidgety/restless 0  Suicidal thoughts 0  PHQ-9 Score 12  Difficult doing work/chores Not difficult at all    Social History   Tobacco Use  . Smoking status: Former Smoker    Last attempt to quit: 05/30/2014    Years since quitting: 3.7  . Smokeless tobacco: Former [employer] Topics  . Alcohol use: No  . Drug use: No    Review of Systems Per HPI unless specifically indicated above     Objective:    BP (!) 141/91   Pulse 86   Temp 98.8 F (37.1 C) (Oral)   Resp 16   Ht 5' 11"  (1.803 m)   Wt 184 lb (83.5 kg)   SpO2 100%   BMI 25.66 kg/m   Wt Readings from Last 3 Encounters:  02/20/18 184 lb (83.5 kg)  12/22/17 177 lb (80.3 kg)  09/17/17 182 lb (82.6 kg)  Physical Exam  Constitutional: He is oriented to person, place, and time. He appears well-developed and well-nourished.  Mild discomfort with chest tightness and upper abdominal chest wall discomfort, cooperative  HENT:  Head: Normocephalic and atraumatic.  Mouth/Throat: Oropharynx is clear and moist.  Eyes: Conjunctivae are normal. Right eye exhibits no discharge. Left eye exhibits no discharge.  Neck: Normal range of motion. Neck supple. No thyromegaly present.  Cardiovascular: Normal rate, regular rhythm, normal heart sounds and intact distal pulses.  No murmur heard. Tender to palpation lower sternal mid chest  Pulmonary/Chest: Effort normal and breath sounds normal. No respiratory distress. He has no wheezes. He has no rales.    Abdominal: Soft. Bowel sounds are normal. He exhibits no distension. There is tenderness (epigastric). There is no rebound and no guarding.  Musculoskeletal: Normal range of motion. He exhibits no edema.  Lymphadenopathy:    He has no cervical adenopathy.  Neurological: He is alert and oriented to person, place, and time.  Skin: Skin is warm and dry. No rash noted. He is not diaphoretic. No erythema.  Bilateral knees with scabs after abrasion, without active bleed or bruising  Psychiatric: He has a normal mood and affect. His behavior is normal.  Well groomed, good eye contact, normal speech and thoughts  Nursing note and vitals reviewed.  EKG - performed in office today  Date: 02/20/18  Rate: 80  Rhythm: normal sinus rhythm  QRS Axis: normal  Intervals: normal  ST/T Wave abnormalities: normal  Conduction Disutrbances:none  Additional Narrative Interpretation: No evidence of acute ST-T abnormality or ischemic changes.  Old EKG Reviewed: none available   Results for orders placed or performed in visit on 05/26/17  COMPLETE METABOLIC PANEL WITH GFR  Result Value Ref Range   Sodium 139 135 - 146 mmol/L   Potassium 4.0 3.5 - 5.3 mmol/L   Chloride 103 98 - 110 mmol/L   CO2 24 20 - 32 mmol/L   Glucose, Bld 85 65 - 99 mg/dL   BUN 8 7 - 25 mg/dL   Creat 0.85 0.60 - 1.35 mg/dL   Total Bilirubin 0.8 0.2 - 1.2 mg/dL   Alkaline Phosphatase 71 40 - 115 U/L   AST 33 10 - 40 U/L   ALT 50 (H) 9 - 46 U/L   Total Protein 7.1 6.1 - 8.1 g/dL   Albumin 4.0 3.6 - 5.1 g/dL   Calcium 9.1 8.6 - 10.3 mg/dL   GFR, Est African American >89 >=60 mL/min   GFR, Est Non African American >89 >=60 mL/min  CBC with Differential/Platelet  Result Value Ref Range   WBC 11.3 (H) 3.8 - 10.8 K/uL   RBC 4.97 4.20 - 5.80 MIL/uL   Hemoglobin 15.7 13.2 - 17.1 g/dL   HCT 45.2 38.5 - 50.0 %   MCV 90.9 80.0 - 100.0 fL   MCH 31.6 27.0 - 33.0 pg   MCHC 34.7 32.0 - 36.0 g/dL   RDW 14.3 11.0 - 15.0 %   Platelets 169  140 - 400 K/uL   MPV 10.3 7.5 - 12.5 fL   Neutro Abs 9,379 (H) 1,500 - 7,800 cells/uL   Lymphs Abs 1,243 850 - 3,900 cells/uL   Monocytes Absolute 678 200 - 950 cells/uL   Eosinophils Absolute 0 (L) 15 - 500 cells/uL   Basophils Absolute 0 0 - 200 cells/uL   Neutrophils Relative % 83 %   Lymphocytes Relative 11 %   Monocytes Relative 6 %   Eosinophils Relative 0 %  Basophils Relative 0 %   Smear Review Criteria for review not met   Lipid panel  Result Value Ref Range   Cholesterol 125 <200 mg/dL   Triglycerides 72 <150 mg/dL   HDL 50 >40 mg/dL   Total CHOL/HDL Ratio 2.5 <5.0 Ratio   VLDL 14 <30 mg/dL   LDL Cholesterol 61 <100 mg/dL  Hemoglobin A1c  Result Value Ref Range   Hgb A1c MFr Bld 5.0 <5.7 %   Mean Plasma Glucose 97 mg/dL  HIV antibody  Result Value Ref Range   HIV 1&2 Ab, 4th Generation NONREACTIVE NONREACTIVE  VITAMIN D 25 Hydroxy (Vit-D Deficiency, Fractures)  Result Value Ref Range   Vit D, 25-Hydroxy 19 (L) 30 - 100 ng/mL      Assessment & Plan:   Problem List Items Addressed This Visit    Bilateral upper abdominal discomfort   Relevant Orders   COMPLETE METABOLIC PANEL WITH GFR   CBC   Lipase   Gastroesophageal reflux disease without esophagitis - Primary   Relevant Medications   sucralfate (CARAFATE) 1 g tablet   Other Relevant Orders   Ambulatory referral to Gastroenterology   COMPLETE METABOLIC PANEL WITH GFR   CBC    Other Visit Diagnoses    Chest tightness       Relevant Orders   EKG 12-Lead   COMPLETE METABOLIC PANEL WITH GFR   CBC   Dizziness          Clinically concern for possible etiology for his constellation of symptoms, leading concern is GERD / PUD / Gastritis related given his history and also link with acute stress and anxiety / panic. He has no known Cardiac history. Now seems acute flare of worsening GERD symptoms. Somewhat improved on high dose PPI 40 BID now - Will rule out possible other etiology today with more urgent  work-up - Symptoms not necessarily exertional but he has variety constellation concern with chest tightness pressure dyspnea, non specific symptoms including general weakness and fatigue, last labs were 05/2017, no prior EKG or other significant cardiac workup on file - Never seen GI - Was on NSAIDs before, none recently, limited improve on Dicyclomine  Plan: First, we discussed my concerns about his symptoms, and I initially recommended that he go to the hospital ED for prompt evaluation, labs, EKG, imaging, and treatment. He declined and preferred outpatient work-up therapy for now, and agree to go to ED if any acute worsening or not improving.  - STAT EKG and Labs today - EKG results above, reassuring NSR, no acute ST-T wave ischemic changes, no comparison EKG - Labs ordered STAT CBC and CMET, Lipase - pending will notify with results - Continue Omeprazole 3m BID for 4-8 weeks for now - notify if need new rx - Start carafate 1g TID WC + QHS PRN for symptom relief - referral to [facility] for further work-up may need endoscopy in future - Strict return criteria given for worsening symptoms   Meds ordered this encounter  Medications  . sucralfate (CARAFATE) 1 g tablet    Sig: Take 1 tablet (1 g total) by mouth 4 (four) times daily -  with meals and at bedtime.    Dispense:  40 tablet    Refill:  1   Orders Placed This Encounter  Procedures  . COMPLETE METABOLIC PANEL WITH GFR  . CBC  . Lipase  . Ambulatory referral to Gastroenterology    Referral Priority:   Routine    Referral Type:  Consultation    Referral Reason:   Specialty Services Required    Number of Visits Requested:   1  . EKG 12-Lead    Follow up plan: Return if symptoms worsen or fail to improve, for chest tightness, GERD.  [name], [facility] 02/20/2018, 1:29 PM

## 2018-02-20 NOTE — Patient Instructions (Addendum)
Thank you for coming to the office today.  EKG looks normal. I do not have an old EKG to compare it to unfortunately for comparison. But I do not see any sign of acute cardiac changes.  STAT blood tests will be run and hopefully reKoreaulted this afternoon, we can call you with results. Call us by 5pm if you do not hear back on results.  KEEP TAKING Omeprazole  TWICE daily for now before meals  Add sucralfate as needed new rx today - take before meals for 3 times daily AS NEEDED for abdominal pain and acid reflux  Referral to GI for further evaluation and likely will need endoscopy EGD to rule out ulcer and other causes  [facility]) [address] - [address] [place], [place] [address] Phone: [phone]  If you have any significant chest pain that does not go away within 30 minutes, is accompanied by nausea, sweating, shortness of breath, or made worse by activity, this may be evidence of a heart attack, especially if symptoms worsening instead of improving, please call 911 or go directly to the emergency room immediately for evaluation.  Please schedule a Follow-up Appointment to: Return if symptoms worsen or fail to improve, for chest tightness, GERD.  If you have any other questions or concerns, please feel free to call the office or send a message through MyChart. You may also schedule an earlier appointment if necessary.  Additionally, you may be receiving a survey about your experience at our office within a few days to 1 week by e-mail or mail. We value your feedback.  [name], DO [facility] [place], [place]

## 2018-02-20 NOTE — Telephone Encounter (Signed)
Patient here to be seen on 02/20/18  [name], DO [facility] 02/20/2018, 11:37 AM

## 2018-03-05 ENCOUNTER — Telehealth: Payer: Self-pay | Admitting: Family Medicine

## 2018-03-05 DIAGNOSIS — M5030 Other cervical disc degeneration, unspecified cervical region: Secondary | ICD-10-CM

## 2018-03-05 NOTE — Telephone Encounter (Signed)
Referral to return to [facility] - spine surgery Dr Maisie Fus Dimmig for cervical spine degenerative disc disease, he was followed before but then has not returned in past 1-2 year, now he requests to return.  Last office visit is 12/22/17 that we have discussed this problem.  Please notify patient referral sent and he can expect call from their office, if not heard back he should call them within next 1-2 weeks.  [facility]: Solon Palm MD Address: [address] [place], [place], [place] [address] Phone: [phone]  [name], DO [facility] 03/05/2018, 4:41 PM

## 2018-03-05 NOTE — Telephone Encounter (Signed)
Pt called states that he was ready for surgery for his neck. Pt called back # is  [phone]

## 2018-03-06 ENCOUNTER — Other Ambulatory Visit: Payer: Self-pay

## 2018-03-06 DIAGNOSIS — F419 Anxiety disorder, unspecified: Secondary | ICD-10-CM

## 2018-03-06 MED ORDER — ALPRAZOLAM 1 MG PO TABS: 1.0000 mg | ORAL_TABLET | Freq: Three times a day (TID) | ORAL | 2 refills | Status: DC

## 2018-03-06 NOTE — Telephone Encounter (Signed)
Patient is requesting Xanax Rx his previous pharmacy was [facility] but wants to use [facility] pharmacy.

## 2018-03-06 NOTE — Telephone Encounter (Signed)
Checked  CSRS PMP AWARE he is due for refill Xanax for 3 months - he request switch from [facility] to [facility] - new rx sent e script to [facility] Xanax  TID #90 +2 refills  [name], DO [facility] 03/06/2018, 12:21 PM

## 2018-04-20 ENCOUNTER — Ambulatory Visit: Payer: Medicaid Other | Admitting: Gastroenterology

## 2018-04-20 ENCOUNTER — Encounter: Payer: Self-pay | Admitting: *Deleted

## 2018-06-01 ENCOUNTER — Encounter: Payer: Self-pay | Admitting: Family Medicine

## 2018-06-01 ENCOUNTER — Other Ambulatory Visit: Payer: Self-pay | Admitting: Family Medicine

## 2018-06-01 ENCOUNTER — Ambulatory Visit (INDEPENDENT_AMBULATORY_CARE_PROVIDER_SITE_OTHER): Payer: Medicaid Other | Admitting: Family Medicine

## 2018-06-01 VITALS — BP 129/88 | HR 84 | Temp 98.7°F | Resp 16 | Ht 71.0 in | Wt 184.0 lb

## 2018-06-01 DIAGNOSIS — K219 Gastro-esophageal reflux disease without esophagitis: Secondary | ICD-10-CM

## 2018-06-01 DIAGNOSIS — M5030 Other cervical disc degeneration, unspecified cervical region: Secondary | ICD-10-CM

## 2018-06-01 DIAGNOSIS — E559 Vitamin D deficiency, unspecified: Secondary | ICD-10-CM

## 2018-06-01 DIAGNOSIS — F419 Anxiety disorder, unspecified: Secondary | ICD-10-CM

## 2018-06-01 DIAGNOSIS — J4530 Mild persistent asthma, uncomplicated: Secondary | ICD-10-CM

## 2018-06-01 DIAGNOSIS — R799 Abnormal finding of blood chemistry, unspecified: Secondary | ICD-10-CM

## 2018-06-01 DIAGNOSIS — Z0000 Encounter for general adult medical examination without abnormal findings: Secondary | ICD-10-CM

## 2018-06-01 DIAGNOSIS — H01004 Unspecified blepharitis left upper eyelid: Secondary | ICD-10-CM

## 2018-06-01 MED ORDER — BUSPIRONE HCL 7.5 MG PO TABS: 3.7500 mg | ORAL_TABLET | Freq: Every day | ORAL | 1 refills | Status: DC

## 2018-06-01 MED ORDER — BACLOFEN 10 MG PO TABS: 5.0000 mg | ORAL_TABLET | Freq: Three times a day (TID) | ORAL | 2 refills | Status: DC | PRN

## 2018-06-01 MED ORDER — OMEPRAZOLE 40 MG PO CPDR: 40.0000 mg | DELAYED_RELEASE_CAPSULE | Freq: Every day | ORAL | 1 refills | Status: DC

## 2018-06-01 MED ORDER — ALPRAZOLAM 1 MG PO TABS: 1.0000 mg | ORAL_TABLET | Freq: Three times a day (TID) | ORAL | 2 refills | Status: DC

## 2018-06-01 MED ORDER — CLINDAMYCIN HCL 300 MG PO CAPS: 300.0000 mg | ORAL_CAPSULE | Freq: Three times a day (TID) | ORAL | 0 refills | Status: DC

## 2018-06-01 NOTE — Patient Instructions (Addendum)
Thank you for coming to the office today.  Likely Blepheritis or eyelid infection - can have blocked tear ducts  I don't think that this is the severe spreading infection type - as we discussed. But will cover with antibiotic now regardless.  If not improved by 48 hours - then can notify office and we can add 2nd antibiotic if no improvement but not worse  - Initial treatment is warm compresses up to 4 to 6 times a day using warm washcloth place over eyelid and apply gentle pressure, hold this on for 10-15 min at a time, can re-warm if cools down  It may drain pus and reduce in size, this is ideal, otherwise if significant worsening with spreading of redness or swelling onto eyelid extending around the whole eye area around face, unable to see, fevers/chills, then please return to clinic sooner or call, may go to Emergency Dept if significant worsening  Otherwise, if improving but not going away after 1-2 weeks, call us back and we will refer you to Ophthalmology if you develop a stye or boil or cyst  Refilled all meds - let me know if problem  DUE for FASTING BLOOD WORK (no food or drink after midnight before the lab appointment, only water or coffee without cream/sugar on the morning of)  SCHEDULE "Lab Only" visit in the morning at the clinic for lab draw in 4-6 weeks  - Make sure Lab Only appointment is at about 1 week before your next appointment, so that results will be available  For Lab Results, once available within 2-3 days of blood draw, you can can log in to MyChart online to view your results and a brief explanation. Also, we can discuss results at next follow-up visit.   Please schedule a Follow-up Appointment to: Return in about 4 weeks (around 06/29/2018) for Annual Physical.  If you have any other questions or concerns, please feel free to call the office or send a message through MyChart. You may also schedule an earlier appointment if necessary.  Additionally, you may be  receiving a survey about your experience at our office within a few days to 1 week by e-mail or mail. We value your feedback.  Saralyn PilarAlexander Karamalegos, DO [facility]

## 2018-06-01 NOTE — Progress Notes (Signed)
Subjective:    Patient ID: Jeffery BalesJoseph M Gotts, male    DOB: 11-14-1975, 42 y.o.   MRN: [id number]  Jeffery King is a 42 y.o. male presenting on 06/01/2018 for Belepharitis (Left side onset yesterday soreness and little painful and swelling increased as compare to yesterday.)  Patient presents for a same day appointment.  HPI   Left Eye Upper eyelid irritation - Reports new acute problem onset yesterday felt some swelling and irritation of Left eyelid and then gradual worse with some discomfort or pain, now seems persistent to not improved today - No prior similar issues like this before, has had some sinus issues similar before - Admits some mild blurry vision in Left eye - No discharge out of eye. Denies any symptoms in Right eye. - Only sick contact was son with a recurrent boil on his finger and he cared for this, but he doesn't think this exactly triggered it but he is concerned. No known exposure to pink eye - Denies any fevers, chills, spreading redness, loss of vision, headache  Additionally: - Follow-up Anxiety - controlled on current doses of Buspar and Alprazolam, request refill today. Denies new worsening anxiety or mood. - GERD - controlled on current regimen, still has some residual symptoms but overall improved, needs refill on PPI, he missed apt with Dr Servando SnareWohl and he needs to re-schedule this. - Chronic Neck pain - improved on baclofen, needs refill. No acute flare today.   Depression screen PHQ 2/9 03/28/2016  Decreased Interest 2  Down, Depressed, Hopeless 1  PHQ - 2 Score 3  Altered sleeping 1  Tired, decreased energy 2  Change in appetite 2  Feeling bad or failure about yourself  2  Trouble concentrating 2  Moving slowly or fidgety/restless 0  Suicidal thoughts 0  PHQ-9 Score 12  Difficult doing work/chores Not difficult at all    Social History   Tobacco Use  . Smoking status: Former Smoker    Last attempt to quit: 05/30/2014    Years since quitting: 4.0  .  Smokeless tobacco: Former Engineer, waterUser  Substance Use Topics  . Alcohol use: No  . Drug use: No    Review of Systems Per HPI unless specifically indicated above     Objective:    BP 129/88 (BP Location: Left Arm, Cuff Size: Normal)   Pulse 84   Temp 98.7 F (37.1 C) (Oral)   Resp 16   Ht 5\' 11"  (1.803 m)   Wt 184 lb (83.5 kg)   BMI 25.66 kg/m   Wt Readings from Last 3 Encounters:  06/01/18 184 lb (83.5 kg)  02/20/18 184 lb (83.5 kg)  12/22/17 177 lb (80.3 kg)    Physical Exam  Constitutional: He is oriented to person, place, and time. He appears well-developed and well-nourished. No distress.  Well-appearing, comfortable, cooperative  HENT:  Head: Normocephalic and atraumatic.  Mouth/Throat: Oropharynx is clear and moist.  Eyes: Pupils are equal, round, and reactive to light. Conjunctivae and EOM are normal. Right eye exhibits no discharge. Left eye exhibits no discharge. No scleral icterus.  Left Upper Eyelid - with generalized erythema and edema, with some localized area possible palpable nodule or swelling may be early stye mild tender  No extending erythema or edema away from eyelid. Lower eyelid not involved. No periorbital region involved  No conjunctival injection  Cardiovascular: Normal rate.  Pulmonary/Chest: Effort normal.  Musculoskeletal: He exhibits no edema.  Neurological: He is alert and oriented to person, place,  and time.  Skin: Skin is warm and dry. No rash noted. He is not diaphoretic. No erythema.  Psychiatric: He has a normal mood and affect. His behavior is normal.  Well groomed, good eye contact, normal speech and thoughts  Nursing note and vitals reviewed.  Results for orders placed or performed in visit on 02/20/18  COMPLETE METABOLIC PANEL WITH GFR  Result Value Ref Range   Glucose, Bld 95 65 - 99 mg/dL   BUN 11 7 - 25 mg/dL   Creat 4.540.94 0.980.60 - 1.191.35 mg/dL   GFR, Est Non African American 100 > OR = 60 mL/min/1.5473m2   GFR, Est African American 115  > OR = 60 mL/min/1.7173m2   BUN/Creatinine Ratio NOT APPLICABLE 6 - 22 (calc)   Sodium 142 135 - 146 mmol/L   Potassium 5.0 3.5 - 5.3 mmol/L   Chloride 106 98 - 110 mmol/L   CO2 32 20 - 32 mmol/L   Calcium 9.6 8.6 - 10.3 mg/dL   Total Protein 7.1 6.1 - 8.1 g/dL   Albumin 4.0 3.6 - 5.1 g/dL   Globulin 3.1 1.9 - 3.7 g/dL (calc)   AG Ratio 1.3 1.0 - 2.5 (calc)   Total Bilirubin 0.4 0.2 - 1.2 mg/dL   Alkaline phosphatase (APISO) 98 40 - 115 U/L   AST 18 10 - 40 U/L   ALT 21 9 - 46 U/L  CBC  Result Value Ref Range   WBC 6.0 3.8 - 10.8 Thousand/uL   RBC 4.86 4.20 - 5.80 Million/uL   Hemoglobin 14.9 13.2 - 17.1 g/dL   HCT 14.742.8 82.938.5 - 56.250.0 %   MCV 88.1 80.0 - 100.0 fL   MCH 30.7 27.0 - 33.0 pg   MCHC 34.8 32.0 - 36.0 g/dL   RDW 13.012.5 86.511.0 - 78.415.0 %   Platelets 189 140 - 400 Thousand/uL   MPV 11.1 7.5 - 12.5 fL  Lipase  Result Value Ref Range   Lipase 48 7 - 60 U/L      Assessment & Plan:   Problem List Items Addressed This Visit    Anxiety Controlled, stable on Alprazolam and Buspar Checked [place] CSRS PMP AWARE, appropriate Refilled 3 month supply - next visit will discuss interval follow-up for refills likely q 6 months     Relevant Medications   ALPRAZolam (XANAX) 1 MG tablet   busPIRone (BUSPAR) 7.5 MG tablet   Degenerative disc disease, cervical Stable without current flare - will refill baclofen since effective during flares PRN    Relevant Medications   baclofen (LIORESAL) 10 MG tablet   Gastroesophageal reflux disease without esophagitis Controlled on PPI Clinically improved Needs to follow-up back with Dr Servando SnareWohl   Relevant Medications   omeprazole (PRILOSEC) 40 MG capsule    Other Visit Diagnoses    Blepharitis of left upper eyelid, unspecified type    -  Primary  Clinically consistent with acute L upper eyelid blepharitis vs pre-septal cellulitis without evidence of concerning systemic symptoms or red flags (has intact EOMI, vision, non-tender, not extending)  unlikely orbital cellulitis.  Plan: 1. Start antibiotics with potential community acq MRSA coverage with Clindamycin 300mg  TID x 10 days, since not quite clinically consistent with actual cellulitis, will defer adding 2nd antibiotic therapy augmentin at this time. Advised strict return criteria if not improving by 48 hours call us back will add augmentin 2. Supportive care - cool vs warm compresses, motrin/tylenol PRN, hand hygiene 3. Strict return criteria if worsening  Relevant Medications   clindamycin (CLEOCIN) 300 MG capsule      Meds ordered this encounter  Medications  . clindamycin (CLEOCIN) 300 MG capsule    Sig: Take 1 capsule (300 mg total) by mouth 3 (three) times daily.    Dispense:  30 capsule    Refill:  0  . ALPRAZolam (XANAX) 1 MG tablet    Sig: Take 1 tablet (1 mg total) by mouth 3 (three) times daily.    Dispense:  90 tablet    Refill:  2  . busPIRone (BUSPAR) 7.5 MG tablet    Sig: Take 0.5 tablets (3.75 mg total) by mouth daily.    Dispense:  45 tablet    Refill:  1  . omeprazole (PRILOSEC) 40 MG capsule    Sig: Take 1 capsule (40 mg total) by mouth daily before breakfast.    Dispense:  90 capsule    Refill:  1  . DISCONTD: baclofen (LIORESAL) 10 MG tablet    Sig: Take 0.5-1 tablets (5-10 mg total) by mouth 3 (three) times daily as needed for muscle spasms.    Dispense:  30 each    Refill:  2  . baclofen (LIORESAL) 10 MG tablet    Sig: Take 0.5-1 tablets (5-10 mg total) by mouth 3 (three) times daily as needed for muscle spasms.    Dispense:  90 each    Refill:  2    Increase fill count    Follow up plan: Return in about 4 weeks (around 06/29/2018) for Annual Physical.  Future labs ordered for 07/10/18  [name], DO [facility] 06/01/2018, 12:34 PM

## 2018-06-10 ENCOUNTER — Telehealth: Payer: Self-pay | Admitting: Nurse Practitioner

## 2018-06-10 NOTE — Telephone Encounter (Signed)
Reviewed the chart. He was referred back to [facility] on 03/05/18 already.  I do not see that he has returned to them yet. Could you check with referral coordinator to see if this prior referral was completed or closed or if it can be re-opened if needed?  Or we may need to resubmit it since he is on Medicaid.  Below is copied information from the referral back on 03/05/18  "Referral to return to [facility] (spine) Dr Maisie Fushomas Dimmig for cervical spine degenerative disc disease, previous MRI 04/2016 with disc protrusion and known DDD of C-spine with chronic pain. Patient is not requesting to return to orthopedic and reconsider surgical procedure."  Saralyn PilarAlexander Vasilia Dise, DO [facility] 06/10/2018, 3:38 PM

## 2018-06-10 NOTE — Telephone Encounter (Signed)
Pt needs referral to ortho.  He is ready to have surgery on neck [phone]

## 2018-06-11 ENCOUNTER — Emergency Department: Payer: Medicaid Other

## 2018-06-11 ENCOUNTER — Encounter: Payer: Self-pay | Admitting: *Deleted

## 2018-06-11 ENCOUNTER — Emergency Department
Admission: EM | Admit: 2018-06-11 | Discharge: 2018-06-11 | Disposition: A | Discharge status: Home / Self Care | Payer: Medicaid Other | Attending: Emergency Medicine | Admitting: Emergency Medicine

## 2018-06-11 ENCOUNTER — Other Ambulatory Visit: Payer: Self-pay

## 2018-06-11 DIAGNOSIS — R064 Hyperventilation: Secondary | ICD-10-CM | POA: Diagnosis not present

## 2018-06-11 DIAGNOSIS — R000 Tachycardia, unspecified: Secondary | ICD-10-CM | POA: Diagnosis not present

## 2018-06-11 DIAGNOSIS — Z87891 Personal history of nicotine dependence: Secondary | ICD-10-CM | POA: Diagnosis not present

## 2018-06-11 DIAGNOSIS — F419 Anxiety disorder, unspecified: Secondary | ICD-10-CM | POA: Diagnosis not present

## 2018-06-11 DIAGNOSIS — R1013 Epigastric pain: Secondary | ICD-10-CM

## 2018-06-11 DIAGNOSIS — J45909 Unspecified asthma, uncomplicated: Secondary | ICD-10-CM | POA: Diagnosis not present

## 2018-06-11 DIAGNOSIS — M542 Cervicalgia: Secondary | ICD-10-CM

## 2018-06-11 DIAGNOSIS — R8271 Bacteriuria: Secondary | ICD-10-CM

## 2018-06-11 DIAGNOSIS — R079 Chest pain, unspecified: Secondary | ICD-10-CM | POA: Diagnosis not present

## 2018-06-11 LAB — URINALYSIS, COMPLETE (UACMP) WITH MICROSCOPIC
Bilirubin Urine: NEGATIVE
Glucose, UA: NEGATIVE mg/dL
Hgb urine dipstick: NEGATIVE
Ketones, ur: 80 mg/dL — AB
Leukocytes, UA: NEGATIVE
Nitrite: NEGATIVE
PROTEIN: 100 mg/dL — AB
SPECIFIC GRAVITY, URINE: 1.031 — AB (ref 1.005–1.030)
pH: 5 (ref 5.0–8.0)

## 2018-06-11 LAB — COMPREHENSIVE METABOLIC PANEL
ALBUMIN: 3.6 g/dL (ref 3.5–5.0)
ALK PHOS: 98 U/L (ref 38–126)
ALT: 25 U/L (ref 0–44)
AST: 25 U/L (ref 15–41)
Anion gap: 9 (ref 5–15)
BILIRUBIN TOTAL: 1.5 mg/dL — AB (ref 0.3–1.2)
BUN: 11 mg/dL (ref 6–20)
CALCIUM: 9 mg/dL (ref 8.9–10.3)
CO2: 24 mmol/L (ref 22–32)
Chloride: 102 mmol/L (ref 98–111)
Creatinine, Ser: 1.02 mg/dL (ref 0.61–1.24)
GFR calc Af Amer: >60 mL/min (ref >60)
GFR calc non Af Amer: >60 mL/min (ref >60)
Glucose, Bld: 107 mg/dL — ABNORMAL HIGH (ref 70–99)
Potassium: 4.2 mmol/L (ref 3.5–5.1)
SODIUM: 135 mmol/L (ref 135–145)
TOTAL PROTEIN: 8.2 g/dL — AB (ref 6.5–8.1)

## 2018-06-11 LAB — LIPASE, BLOOD: Lipase: 27 U/L (ref 11–51)

## 2018-06-11 LAB — CBC
HEMATOCRIT: 41.9 % (ref 40.0–52.0)
Hemoglobin: 14.8 g/dL (ref 13.0–18.0)
MCH: 31.2 pg (ref 26.0–34.0)
MCHC: 35.4 g/dL (ref 32.0–36.0)
MCV: 88 fL (ref 80.0–100.0)
Platelets: 211 10*3/uL (ref 150–440)
RBC: 4.76 MIL/uL (ref 4.40–5.90)
RDW: 13.5 % (ref 11.5–14.5)
WBC: 11.8 10*3/uL — ABNORMAL HIGH (ref 3.8–10.6)

## 2018-06-11 LAB — TROPONIN I

## 2018-06-11 MED ORDER — SODIUM CHLORIDE 0.9 % IV BOLUS
1000.0000 mL | Freq: Once | INTRAVENOUS | Status: AC
  Administered 2018-06-11: 1000 mL via INTRAVENOUS

## 2018-06-11 MED ORDER — LORAZEPAM 2 MG/ML IJ SOLN
2.0000 mg | Freq: Once | INTRAMUSCULAR | Status: AC
  Administered 2018-06-11: 2 mg via INTRAVENOUS
  Filled 2018-06-11: qty 1

## 2018-06-11 MED ORDER — FENTANYL CITRATE (PF) 100 MCG/2ML IJ SOLN
75.0000 ug | Freq: Once | INTRAMUSCULAR | Status: DC
  Filled 2018-06-11: qty 2

## 2018-06-11 MED ORDER — MORPHINE SULFATE (PF) 2 MG/ML IV SOLN
2.0000 mg | Freq: Once | INTRAVENOUS | Status: AC
  Administered 2018-06-11: 2 mg via INTRAVENOUS
  Filled 2018-06-11: qty 1

## 2018-06-11 MED ORDER — ONDANSETRON HCL 4 MG/2ML IJ SOLN
4.0000 mg | Freq: Once | INTRAMUSCULAR | Status: AC
  Administered 2018-06-11: 4 mg via INTRAVENOUS
  Filled 2018-06-11: qty 2

## 2018-06-11 MED ORDER — GI COCKTAIL ~~LOC~~
30.0000 mL | Freq: Once | ORAL | Status: DC
  Filled 2018-06-11: qty 30

## 2018-06-11 NOTE — ED Triage Notes (Addendum)
Pt has abd pain with vomiting.  Vomited x 1.  Diarrhea x 6.  Pt also has upper back pain.  Pt anxious.    Sx for 2-3 days.  Pt alert.

## 2018-06-11 NOTE — ED Notes (Signed)
Called lab and spoke with [facility] and they will add on tests

## 2018-06-11 NOTE — Discharge Instructions (Addendum)
Please make an appointment to follow-up with your primary care physician; please make a pain management plan that he can follow at home.  Return to the emergency department if you develop severe pain, lightheadedness or fainting, fever, numbness feeling or weakness, inability to walk, inability to keep down fluids, or any other symptoms concerning to you.

## 2018-06-11 NOTE — ED Provider Notes (Signed)
[facility] Emergency Department Provider Note  ____________________________________________  Time seen: Approximately 8:01 PM  I have reviewed the triage vital signs and the nursing notes.   HISTORY  Chief Complaint Abdominal Pain    HPI Jeffery King is a 42 y.o. male with a history of anxiety, chronic upper abdominal discomfort, chronic neck pain, presenting for neck and abdominal pain.  Patient reports that he is currently being evaluated for surgery for a "slipped disc" in the neck and that he chronically gets spasms.  Tonight, the patient felt like his spasms were worsening.  He denies any new trauma, numbness tingling or weakness, although he does have occasionally have radiation down the left side.  In the past he has had radiation on the right side.  He reports that if he crosses his legs, leans his arms forward and bends himself forward, this helps the pain.  In addition, the patient reports that for months he has had the sensation of a "balloon blowing up" in the epigastrium, and his primary care physician has treated him for reflux with Tums, Gas-X, Protonix and probiotics which he states has helped.  Vomiting also resolved his pain.  Tonight when the patient's neck pain flared up, his epigastric pain began to hurt as well.  Past Medical History:  Diagnosis Date  . Anxiety   . Asthma     Patient Active Problem List   Diagnosis Date Noted  . Bilateral upper abdominal discomfort 09/17/2017  . Environmental and seasonal allergies 02/20/2017  . Vitamin D insufficiency 02/20/2017  . Gastroesophageal reflux disease without esophagitis 08/01/2016  . Anxiety 03/28/2016  . Mild persistent asthma 03/28/2016  . Degenerative disc disease, cervical 05/18/2014    No past surgical history on file.  Current Outpatient Rx  . Order #: [id number] Class: Normal  . Order #: [id number] Class: Normal  . Order #: [id number] Class: Normal  . Order #: [id number] Class: Normal   . Order #: [id number] Class: Normal  . Order #: [id number] Class: Normal  . Order #: [id number] Class: Normal  . Order #: [id number] Class: Normal  . Order #: [id number] Class: Normal  . Order #: [id number] Class: Print    Allergies Aspirin; Gabapentin; Wellbutrin [bupropion]; Ibuprofen; Meloxicam; and Naproxen  Family History  Problem Relation Age of Onset  . Hypertension Mother   . Hypertension Father   . Hypertension Brother     Social History Social History   Tobacco Use  . Smoking status: Former Smoker    Last attempt to quit: 05/30/2014    Years since quitting: 4.0  . Smokeless tobacco: Former Engineer, water Use Topics  . Alcohol use: No  . Drug use: No    Review of Systems Constitutional: No fever/chills.  Lightheadedness or syncope. Eyes: No visual changes. ENT: No sore throat. No congestion or rhinorrhea. Cardiovascular: Denies chest pain. Denies palpitations. Respiratory: Denies shortness of breath.  No cough. Gastrointestinal: Positive epigastric chronic abdominal pain.  Positive nausea, no vomiting.  No diarrhea.  No constipation. Genitourinary: Negative for dysuria. Musculoskeletal: Negative for back pain.  Positive for neck pain. Skin: Negative for rash. Neurological: Negative for headaches. No focal numbness, tingling or weakness.  Psychiatric:Positive anxiety.  ____________________________________________   PHYSICAL EXAM:  VITAL SIGNS: ED Triage Vitals  Enc Vitals Group     BP 06/11/18 1746 (!) 120/98     Pulse Rate 06/11/18 1746 (!) 142     Resp 06/11/18 1746 (!) 22     Temp 06/11/18 1746 99 F (37.2 C)  Temp Source 06/11/18 1746 Oral     SpO2 06/11/18 1746 95 %     Weight 06/11/18 1744 186 lb (84.4 kg)     Height 06/11/18 1744 5\' 11"  (1.803 m)     Head Circumference --      Peak Flow --      Pain Score 06/11/18 1744 8     Pain Loc --      Pain Edu? --      Excl. in GC? --     Constitutional: Alert and oriented.Answers questions  appropriately.  Patient is sitting in the chair, hyperventilating, with his neck and face clenched. Eyes: Conjunctivae are normal.  EOMI. No scleral icterus. Head: Atraumatic. Nose: No congestion/rhinnorhea. Mouth/Throat: Mucous membranes are moist.  Neck: No stridor.  Supple.  No midline C-spine tenderness to palpation, step-offs or deformities.  I am unable to reproduce the patient's pain when I push on his neck, he states "it feels good when you do that." Cardiovascular: Fast rate, regular rhythm. No murmurs, rubs or gallops.  Respiratory: The patient is clenched and tachypneic without accessory muscle use or retractions. Lungs CTAB.  No wheezes, rales or ronchi. Gastrointestinal: Soft, nontender and nondistended.  No guarding or rebound.  No peritoneal signs. Musculoskeletal: No LE edema. No ttp in the calves or palpable cords.  Negative Homan's sign.  No midline thoracic or lumbar spine tenderness to palpation, step-offs or deformities. Neurologic:  A&Ox3.  Speech is clear.  Face and smile are symmetric.  EOMI.  Moves all extremities well.  Patient is able to ambulate without significant difficulty.   Skin:  Skin is warm, dry and intact. No rash noted. Psychiatric: Patient has a very anxious mood with a bizarre affect.  He is hyperventilating.  ____________________________________________   LABS (all labs ordered are listed, but only abnormal results are displayed)  Labs Reviewed  COMPREHENSIVE METABOLIC PANEL - Abnormal; Notable for the following components:      Result Value   Glucose, Bld 107 (*)    Total Protein 8.2 (*)    Total Bilirubin 1.5 (*)    All other components within normal limits  CBC - Abnormal; Notable for the following components:   WBC 11.8 (*)    All other components within normal limits  URINALYSIS, COMPLETE (UACMP) WITH MICROSCOPIC - Abnormal; Notable for the following components:   Color, Urine AMBER (*)    APPearance CLEAR (*)    Specific Gravity, Urine  1.031 (*)    Ketones, ur 80 (*)    Protein, ur 100 (*)    Bacteria, UA RARE (*)    All other components within normal limits  URINE CULTURE  LIPASE, BLOOD   ____________________________________________  EKG  ED ECG REPORT I, Anne-Caroline Sharma Covert, the attending physician, personally viewed and interpreted this ECG.   Date: 06/11/2018  EKG Time: 1751  Rate: 139  Rhythm: sinus tachycardia  Axis: normal  Intervals:none  ST&T Change: No STEMI  ____________________________________________  RADIOLOGY  No results found.  ____________________________________________   PROCEDURES  Procedure(s) performed: None  Procedures  Critical Care performed: No ____________________________________________   INITIAL IMPRESSION / ASSESSMENT AND PLAN / ED COURSE  Pertinent labs & imaging results that were available during my care of the patient were reviewed by me and considered in my medical decision making (see chart for details).  42 y.o. with a history of chronic neck and epigastric pain senting for neck and back pain.  Overall, the patient is significantly tachycardic but he is  bearing down, hyperventilating, and clenching all of his muscles.  We will try to relax and and confirm that his tachycardia resolves.  I do not see any ischemic changes on his EKG and he is not having any chest pain.  The patient's history, abdominal examination and laboratory studies are not suggestive of a emergent cause for his epigastric pain.  Get a chest x-ray to rule out Boerhaave's or any free air.  The patient's neck pain is chronic and he has no neurologic deficits on my examination.  The patient will undergo symptomatic treatment and I will reevaluate him for final disposition.  ____________________________________________  FINAL CLINICAL IMPRESSION(S) / ED DIAGNOSES  Final diagnoses:  None    Clinical Course as of Jun 11 2157  Thu Jun 11, 2018  2119 The patient's chest x-ray shows  opacification over the anterior lingula; he has not been having any signs or symptoms that would be consistent with pneumonia.  He denies any cough or fever.  Atelectasis is more likely.  However, I have given him this result and given him instructions about follow-up if he does develop any symptoms concerning for pneumonia.   [AN]  2124 At this time, the patient's neck pain has significantly improved.  He does state that he is continuing to have epigastric discomfort so we will try a GI cocktail.  He continues to be clenching his jaw, all of his upper extremity muscles and his abdominal muscles.  I have asked him to use breathing techniques to help himself feel better.  His heart rate has significantly improved but it is unlikely that it will normalize if he is not able to relax more.   [AN]  2134 The patient now states that he stood up and "stretched" and that the pain has completely resolved.  I am awaiting his troponin and will plan discharge home.   [AN]    Clinical Course User Index [AN] Rockne MenghiniNorman, Anne-Caroline, MD      NEW MEDICATIONS STARTED DURING THIS VISIT:  New Prescriptions   No medications on file      Rockne MenghiniNorman, Anne-Caroline, MD 06/11/18 2158

## 2018-06-11 NOTE — ED Notes (Signed)
[facility] and [facility] stated [facility] somehow got overlooked and wasn't running. Per [facility] will run STAT. MD informed

## 2018-06-12 ENCOUNTER — Encounter: Payer: Self-pay | Admitting: Radiology

## 2018-06-12 ENCOUNTER — Telehealth: Payer: Self-pay

## 2018-06-12 ENCOUNTER — Emergency Department: Payer: Medicaid Other

## 2018-06-12 ENCOUNTER — Emergency Department
Admission: EM | Admit: 2018-06-12 | Discharge: 2018-06-12 | Disposition: A | Discharge status: Home / Self Care | Payer: Medicaid Other | Attending: Emergency Medicine | Admitting: Emergency Medicine

## 2018-06-12 ENCOUNTER — Other Ambulatory Visit: Payer: Self-pay

## 2018-06-12 DIAGNOSIS — Z79899 Other long term (current) drug therapy: Secondary | ICD-10-CM | POA: Insufficient documentation

## 2018-06-12 DIAGNOSIS — Z87891 Personal history of nicotine dependence: Secondary | ICD-10-CM | POA: Diagnosis not present

## 2018-06-12 DIAGNOSIS — J181 Lobar pneumonia, unspecified organism: Secondary | ICD-10-CM | POA: Diagnosis not present

## 2018-06-12 DIAGNOSIS — J45909 Unspecified asthma, uncomplicated: Secondary | ICD-10-CM | POA: Diagnosis not present

## 2018-06-12 DIAGNOSIS — R042 Hemoptysis: Secondary | ICD-10-CM

## 2018-06-12 DIAGNOSIS — R079 Chest pain, unspecified: Secondary | ICD-10-CM | POA: Diagnosis not present

## 2018-06-12 DIAGNOSIS — J188 Other pneumonia, unspecified organism: Secondary | ICD-10-CM | POA: Diagnosis not present

## 2018-06-12 DIAGNOSIS — J189 Pneumonia, unspecified organism: Secondary | ICD-10-CM

## 2018-06-12 LAB — CBC
HEMATOCRIT: 40.9 % (ref 40.0–52.0)
Hemoglobin: 14.3 g/dL (ref 13.0–18.0)
MCH: 31.5 pg (ref 26.0–34.0)
MCHC: 35.1 g/dL (ref 32.0–36.0)
MCV: 89.9 fL (ref 80.0–100.0)
PLATELETS: 172 10*3/uL (ref 150–440)
RBC: 4.55 MIL/uL (ref 4.40–5.90)
RDW: 13.4 % (ref 11.5–14.5)
WBC: 7 10*3/uL (ref 3.8–10.6)

## 2018-06-12 LAB — BASIC METABOLIC PANEL
Anion gap: 7 (ref 5–15)
BUN: 14 mg/dL (ref 6–20)
CHLORIDE: 106 mmol/L (ref 98–111)
CO2: 26 mmol/L (ref 22–32)
CREATININE: 0.96 mg/dL (ref 0.61–1.24)
Calcium: 8.5 mg/dL — ABNORMAL LOW (ref 8.9–10.3)
GFR calc non Af Amer: >60 mL/min (ref >60)
Glucose, Bld: 91 mg/dL (ref 70–99)
POTASSIUM: 3.9 mmol/L (ref 3.5–5.1)
Sodium: 139 mmol/L (ref 135–145)

## 2018-06-12 LAB — HEMOGLOBIN AND HEMATOCRIT, BLOOD
HEMATOCRIT: 36.2 % — AB (ref 40.0–52.0)
Hemoglobin: 12.7 g/dL — ABNORMAL LOW (ref 13.0–18.0)

## 2018-06-12 LAB — TROPONIN I: Troponin I: <0.03 ng/mL (ref <0.03)

## 2018-06-12 MED ORDER — SODIUM CHLORIDE 0.9 % IV SOLN
500.0000 mg | Freq: Once | INTRAVENOUS | Status: AC
  Administered 2018-06-12: 500 mg via INTRAVENOUS
  Filled 2018-06-12: qty 500

## 2018-06-12 MED ORDER — SODIUM CHLORIDE 0.9 % IV BOLUS
1000.0000 mL | Freq: Once | INTRAVENOUS | Status: AC
  Administered 2018-06-12: 1000 mL via INTRAVENOUS

## 2018-06-12 MED ORDER — LEVOFLOXACIN 750 MG PO TABS: 750.0000 mg | ORAL_TABLET | Freq: Every day | ORAL | 0 refills | Status: DC

## 2018-06-12 MED ORDER — IOPAMIDOL (ISOVUE-370) INJECTION 76%
100.0000 mL | Freq: Once | INTRAVENOUS | Status: AC | PRN
  Administered 2018-06-12: 100 mL via INTRAVENOUS

## 2018-06-12 MED ORDER — SODIUM CHLORIDE 0.9 % IV SOLN
1.0000 g | Freq: Once | INTRAVENOUS | Status: AC
  Administered 2018-06-12: 1 g via INTRAVENOUS
  Filled 2018-06-12: qty 10

## 2018-06-12 NOTE — Telephone Encounter (Signed)
patient states he is getting worst after his ER visit from yesterday and splitting up more blood now as compare to past 4 days. Family and patient is concerned advised patient to go back with worsening of Sx's and called ER triage nurse and spoke to EntiatLisa "a secretory from ER" will pass message to ER nurse.

## 2018-06-12 NOTE — ED Triage Notes (Signed)
Pt states that he has been coughing up blood for awhile, pt states that when he attempts to take a deep breath in it catches him sharply

## 2018-06-12 NOTE — ED Notes (Signed)
Patient transported to CT 

## 2018-06-12 NOTE — ED Provider Notes (Addendum)
[facility] Emergency Department Provider Note  ____________________________________________  Time seen: Approximately 6:08 PM  I have reviewed the triage vital signs and the nursing notes.   HISTORY  Chief Complaint Hemoptysis    HPI Jeffery King is a 42 y.o. male history of anxiety, chronic neck and epigastric pain, presenting now for hemoptysis.  I saw this patient yesterday with a reassuring work-up for neck and epigastric pain and today he states that he is now having a "mild" cough with bloody sputum.  He reports subjective fever although he was afebrile yesterday and afebrile today.  He has not had any lightheadedness or syncope.  No congestion or rhinorrhea.  No ear pain.  Smoking 4 years ago.  Today, the patient reports that his neck and epigastric pain have completely resolved.  Past Medical History:  Diagnosis Date  . Anxiety   . Asthma     Patient Active Problem List   Diagnosis Date Noted  . Bilateral upper abdominal discomfort 09/17/2017  . Environmental and seasonal allergies 02/20/2017  . Vitamin D insufficiency 02/20/2017  . Gastroesophageal reflux disease without esophagitis 08/01/2016  . Anxiety 03/28/2016  . Mild persistent asthma 03/28/2016  . Degenerative disc disease, cervical 05/18/2014    No past surgical history on file.  Current Outpatient Rx  . Order #: [id number] Class: Normal  . Order #: [id number] Class: Normal  . Order #: [id number] Class: Normal  . Order #: [id number] Class: Normal  . Order #: [id number] Class: Normal  . Order #: [id number] Class: Normal  . Order #: [id number] Class: Normal  . Order #: [id number] Class: Normal  . Order #: [id number] Class: Normal  . Order #: [id number] Class: Normal  . Order #: [id number] Class: Print    Allergies Aspirin; Gabapentin; Wellbutrin [bupropion]; Ibuprofen; Meloxicam; and Naproxen  Family History  Problem Relation Age of Onset  . Hypertension Mother   . Hypertension Father   .  Hypertension Brother     Social History Social History   Tobacco Use  . Smoking status: Former Smoker    Last attempt to quit: 05/30/2014    Years since quitting: 4.0  . Smokeless tobacco: Former Engineer, water Use Topics  . Alcohol use: No  . Drug use: No    Review of Systems Constitutional: No fever/chills.  Headedness or syncope. Eyes: No visual changes.  Eye discharge. ENT: No sore throat. No congestion or rhinorrhea.  No ear pain. Cardiovascular: Positive chest pain with cough only. Denies palpitations. Respiratory: Denies shortness of breath.  Mild cough with blood in the sputum. Gastrointestinal: No abdominal pain.  No nausea, no vomiting.  No diarrhea.  No constipation. Genitourinary: Negative for dysuria. Musculoskeletal: Negative for back pain. Skin: Negative for rash. Neurological: Negative for headaches. No focal numbness, tingling or weakness.     ____________________________________________   PHYSICAL EXAM:  VITAL SIGNS: ED Triage Vitals  Enc Vitals Group     BP 06/12/18 1440 127/87     Pulse Rate 06/12/18 1440 (!) 108     Resp 06/12/18 1440 18     Temp 06/12/18 1440 98.5 F (36.9 C)     Temp Source 06/12/18 1440 Oral     SpO2 06/12/18 1440 96 %     Weight 06/12/18 1441 185 lb (83.9 kg)     Height 06/12/18 1441 5\' 11"  (1.803 m)     Head Circumference --      Peak Flow --      Pain Score 06/12/18 1440 2     Pain Loc --  Pain Edu? --      Excl. in GC? --     Constitutional: Alert and oriented. Answers questions appropriately. Eyes: Conjunctivae are normal.  EOMI. No scleral icterus. Head: Atraumatic. Nose: No congestion/rhinnorhea. Mouth/Throat: Mucous membranes are moist.  No posterior pharyngeal erythema, tonsillar swelling or exudate.  The patient's posterior palate is symmetric and the uvula is midline.  There is no evidence for skin break in the mucous membranes of the mouth. Neck: No stridor.  Supple.  No JVD.  No  meningismus. Cardiovascular: Normal rate, regular rhythm. No murmurs, rubs or gallops.  Respiratory: Normal respiratory effort.  No accessory muscle use or retractions. Lungs CTAB.  No wheezes, rales or ronchi.  O2 sats are 97% on room air on my examination. Gastrointestinal: Soft, nontender and nondistended.  No guarding or rebound.  No peritoneal signs. Musculoskeletal: No LE edema. No ttp in the calves or palpable cords.  Negative Homan's sign. Neurologic:  A&Ox3.  Speech is clear.  Face and smile are symmetric.  EOMI.  Moves all extremities well. Skin:  Skin is warm, dry and intact. No rash noted. Psychiatric: Mood is normal with bizarre affect.  The patient appears less anxious today than yesterday. ____________________________________________   LABS (all labs ordered are listed, but only abnormal results are displayed)  Labs Reviewed  BASIC METABOLIC PANEL - Abnormal; Notable for the following components:      Result Value   Calcium 8.5 (*)    All other components within normal limits  HEMOGLOBIN AND HEMATOCRIT, BLOOD - Abnormal; Notable for the following components:   Hemoglobin 12.7 (*)    HCT 36.2 (*)    All other components within normal limits  CBC  TROPONIN I   ____________________________________________  EKG  ED ECG REPORT I, Anne-Caroline Sharma CovertNorman, the attending physician, personally viewed and interpreted this ECG.   Date: 06/12/2018  EKG Time: 1444  Rate: 107  Rhythm: sinus tachycardia  Axis: normal  Intervals:none  ST&T Change: No STEMI  ____________________________________________  RADIOLOGY  Dg Chest 2 View  Result Date: 06/12/2018 CLINICAL DATA:  Chest pain. EXAM: CHEST - 2 VIEW COMPARISON:  06/11/2018.  01/21/2013. FINDINGS: Mediastinum and hilar structures normal. Persistent lingular atelectasis and infiltrate. Apical scratched it biapical pleural thickening consistent with scarring. No pleural effusion or pneumothorax. No acute bony abnormality  IMPRESSION: Persistent lingular atelectasis and infiltrate. Followup PA and lateral chest X-ray is recommended in 3-4 weeks following trial of antibiotic therapy to ensure resolution and exclude underlying malignancy. Electronically Signed   By: Maisie Fushomas  Register   On: 06/12/2018 15:16   Dg Chest 2 View  Result Date: 06/11/2018 CLINICAL DATA:  Central chest pain 2-3 days. EXAM: CHEST - 2 VIEW COMPARISON:  01/21/2013 FINDINGS: Lungs are adequately inflated demonstrate opacification over the anterior lingula which may be due to atelectasis or pneumonia. Cardiomediastinal silhouette and remainder of the exam is unchanged. IMPRESSION: Focal airspace opacification over the anterior lingula which may be due to atelectasis or infection. Electronically Signed   By: Elberta Fortisaniel  Boyle M.D.   On: 06/11/2018 21:07   Ct Angio Chest Pe W And/or Wo Contrast  Result Date: 06/12/2018 CLINICAL DATA:  Hemoptysis.  Chest pain with deep inspiration. EXAM: CT ANGIOGRAPHY CHEST WITH CONTRAST TECHNIQUE: Multidetector CT imaging of the chest was performed using the standard protocol during bolus administration of intravenous contrast. Multiplanar CT image reconstructions and MIPs were obtained to evaluate the vascular anatomy. CONTRAST:  100mL ISOVUE-370 IOPAMIDOL (ISOVUE-370) INJECTION 76% COMPARISON:  Two-view chest x-ray  from the same day. FINDINGS: Cardiovascular: The heart size is normal. Aortic arch is unremarkable. Pulmonary artery opacification is excellent. No focal filling defects are present to suggest pulmonary emboli. Mediastinum/Nodes: No significant mediastinal, hilar, or axillary adenopathy is present thoracic inlet is within normal limits. Esophagus is normal. Lungs/Pleura: Focal airspace disease is present posteriorly and inferiorly within the lingula. No central obstructing lesion is present. Bronchi are patent. A small left pleural effusion is present no other focal nodule, mass, or airspace disease is present. Upper  Abdomen: No acute abnormality. Musculoskeletal: Schmorl's nodes are present in the lower thoracic spine. Vertebral body heights and alignment are otherwise maintained. Review of the MIP images confirms the above findings. IMPRESSION: 1. Lingular airspace disease most consistent with segmental pneumonia. 2. No central obstructing lesion. 3. No pulmonary embolus. 4. Small left pleural effusion. Electronically Signed   By: Marin Roberts M.D.   On: 06/12/2018 19:04    ____________________________________________   PROCEDURES  Procedure(s) performed: None  Procedures  Critical Care performed: No ____________________________________________   INITIAL IMPRESSION / ASSESSMENT AND PLAN / ED COURSE  Pertinent labs & imaging results that were available during my care of the patient were reviewed by me and considered in my medical decision making (see chart for details).  42 y.o. male with chronic neck and epigastric pain, presenting with cough, subjective fevers, and mild hemoptysis.  Overall, the patient is hemodynamically stable; he is tachycardic to 107 today but also anxious although less anxious than yesterday.  We will recheck his heart rate and continue to monitor him in the emergency department.  The patient had an area that was consistent with atelectasis or infection on his chest x-ray yesterday, which is unchanged today.  Yesterday, the patient was not having any infectious symptoms at all and he had postural changes that could explain atelectasis.  Today, since this is persistent, we will treat him for community-acquired pneumonia with ceftriaxone and azithromycin.  The patient's hemoglobin and hematocrit are stable.  I do not think he is have any  life-threatening bleeding today.  A CT of the chest has been ordered to evaluate for PE, and to have a better appreciation of the abnormality in his chest x-ray.  Plan reevaluation for final  disposition.  ----------------------------------------- 7:23 PM on 06/12/2018 -----------------------------------------  The patient's CT does not show any evidence of PE.  He does have some airspace disease in the lingula and has received his first dose of antibiotics.  He will be discharged home with a week supply of Levaquin, and instructions to follow-up tomorrow for reevaluation of his blood counts.  He understands follow-up instructions as well as return precautions.  ----------------------------------------- 8:30 PM on 06/12/2018 -----------------------------------------  Patient's repeat hemoglobin and hematocrit show a hemoglobin of 12.7 and a hematocrit of 36.2.  The patient did receive intravenous fluids while he was here so there may be some component of hemo-dilution.  He has not had any significant hemoptysis while he has been here.  I have offered the patient admission to stay at the hospital overnight for continued surveillance of his blood counts, but he has declined and prefers to follow-up with his primary care physician.  He understands that he could have a severe life-threatening bleeding incident if he is discharged, and is willing to assume that risk.  I have given him very strict return precautions.  The patient continues to remain hemodynamically stable at this time peer  ____________________________________________  FINAL CLINICAL IMPRESSION(S) / ED DIAGNOSES  Final diagnoses:  Hemoptysis  Lingular pneumonia         NEW MEDICATIONS STARTED DURING THIS VISIT:  New Prescriptions   LEVOFLOXACIN (LEVAQUIN) 750 MG TABLET    Take 1 tablet (750 mg total) by mouth daily for 7 days.      Rockne Menghini, MD 06/12/18 1933    Rockne Menghini, MD 06/12/18 2031

## 2018-06-12 NOTE — Telephone Encounter (Signed)
[facility] MsNikki a Armed forces training and education officerreferral coordinator, his appointment was scheduled in 06/2018 and patient is notified.

## 2018-06-12 NOTE — Telephone Encounter (Signed)
Patient called back today 8/23 after he was seen in ED and he is requesting to be notified about upcoming Orthopedics and potential surgery.  Can you check status of his referral? I can place new one if needed.  Saralyn PilarAlexander Vashon Riordan, DO [facility] 06/12/2018, 8:11 AM

## 2018-06-12 NOTE — Discharge Instructions (Addendum)
Take the entire course of antibiotics, even if you are feeling better.  Make a follow-up appointment with your primary care physician for reevaluation and recheck of your blood work tomorrow.  If you are unable to be seen in your primary care physician's office, you may go to urgent care or come back to the emergency department to have your blood counts rechecked.  Return to the emergency department if you develop severe pain, lightheadedness or fainting, fever, or any other symptoms concerning to you.

## 2018-06-13 LAB — URINE CULTURE: CULTURE: NO GROWTH

## 2018-06-15 ENCOUNTER — Telehealth: Payer: Self-pay | Admitting: Family Medicine

## 2018-06-15 DIAGNOSIS — J189 Pneumonia, unspecified organism: Secondary | ICD-10-CM

## 2018-06-15 MED ORDER — AZITHROMYCIN 250 MG PO TABS: ORAL_TABLET | ORAL | 0 refills | Status: DC

## 2018-06-15 NOTE — Telephone Encounter (Signed)
Please refer to recent course ED visit 8/22 and 8/23 also telephone call on 8/23 in interval. Ultimately, patient was dx with hemoptysis and lingular pneumonia CAP presumed on CT Chest, he had other work-up unremarkable. He was given Ceftriaxone and Azithro IV in hospital and they offered admission, he declined overnight and was discharged on Levaquin 750mg  daily x 7 days. He was concerned about this med after reading potential side effects, he thinks it was only for very severe cases and preferred to take a more mild antibiotic, he was concerned about tendon and ligament side effect that is rare. He called our after hours nurse line over weekend and spoke with on call provider, they recommended to proceed with ED treatment and call us today Monday 8/26.  Called him back returning his call, and he states overall he is feeling better now, and thinks the antibiotics they gave him in hospital are working. No new concerns today. He would like to take diff antibiotic, I offered Azithromycin Z-pak dosing to finish for CAP and he can discontinue the Levaquin if that is his preference. He agrees. rx sent today.  Regarding mild low Hemoglobin in ED, they asked him to stay and observe this, but he was not worried. He does not have symptoms of bleeding (now with resolving hemoptysis) and no other concern for anemia, no prior low Hgb result, will prefer to wait 4 weeks and check labs as planned in September for physical.  He will follow-up sooner or notify us if any new symptoms or concerns related to this.  Jeffery PilarAlexander Lorre Opdahl, DO [facility] 06/15/2018, 12:06 PM

## 2018-07-10 ENCOUNTER — Other Ambulatory Visit: Payer: Medicaid Other

## 2018-07-14 ENCOUNTER — Encounter: Payer: Medicaid Other | Admitting: Family Medicine

## 2018-07-29 ENCOUNTER — Other Ambulatory Visit: Payer: Self-pay | Admitting: Family Medicine

## 2018-07-29 DIAGNOSIS — F419 Anxiety disorder, unspecified: Secondary | ICD-10-CM

## 2018-08-11 ENCOUNTER — Other Ambulatory Visit: Payer: Medicaid Other

## 2018-08-11 DIAGNOSIS — E559 Vitamin D deficiency, unspecified: Secondary | ICD-10-CM | POA: Diagnosis not present

## 2018-08-11 DIAGNOSIS — F419 Anxiety disorder, unspecified: Secondary | ICD-10-CM | POA: Diagnosis not present

## 2018-08-11 DIAGNOSIS — R799 Abnormal finding of blood chemistry, unspecified: Secondary | ICD-10-CM | POA: Diagnosis not present

## 2018-08-11 DIAGNOSIS — K219 Gastro-esophageal reflux disease without esophagitis: Secondary | ICD-10-CM | POA: Diagnosis not present

## 2018-08-11 DIAGNOSIS — Z0000 Encounter for general adult medical examination without abnormal findings: Secondary | ICD-10-CM | POA: Diagnosis not present

## 2018-08-12 LAB — COMPLETE METABOLIC PANEL WITH GFR
AG RATIO: 1.3 (calc) (ref 1.0–2.5)
ALT: 33 U/L (ref 9–46)
AST: 28 U/L (ref 10–40)
Albumin: 4.1 g/dL (ref 3.6–5.1)
Alkaline phosphatase (APISO): 86 U/L (ref 40–115)
BUN: 10 mg/dL (ref 7–25)
CALCIUM: 9.4 mg/dL (ref 8.6–10.3)
CO2: 27 mmol/L (ref 20–32)
Chloride: 105 mmol/L (ref 98–110)
Creat: 1.03 mg/dL (ref 0.60–1.35)
GFR, EST NON AFRICAN AMERICAN: 89 mL/min/{1.73_m2} (ref >=60)
GFR, Est African American: 103 mL/min/{1.73_m2} (ref >=60)
GLUCOSE: 86 mg/dL (ref 65–99)
Globulin: 3.2 g/dL (calc) (ref 1.9–3.7)
POTASSIUM: 4.5 mmol/L (ref 3.5–5.3)
Sodium: 140 mmol/L (ref 135–146)
Total Bilirubin: 0.6 mg/dL (ref 0.2–1.2)
Total Protein: 7.3 g/dL (ref 6.1–8.1)

## 2018-08-12 LAB — CBC WITH DIFFERENTIAL/PLATELET
BASOS PCT: 0.6 %
Basophils Absolute: 31 cells/uL (ref 0–200)
EOS ABS: 109 {cells}/uL (ref 15–500)
EOS PCT: 2.1 %
HCT: 45.7 % (ref 38.5–50.0)
HEMOGLOBIN: 15.8 g/dL (ref 13.2–17.1)
Lymphs Abs: 1430 cells/uL (ref 850–3900)
MCH: 30.6 pg (ref 27.0–33.0)
MCHC: 34.6 g/dL (ref 32.0–36.0)
MCV: 88.4 fL (ref 80.0–100.0)
MONOS PCT: 14.9 %
MPV: 11.8 fL (ref 7.5–12.5)
NEUTROS ABS: 2855 {cells}/uL (ref 1500–7800)
Neutrophils Relative %: 54.9 %
PLATELETS: 195 10*3/uL (ref 140–400)
RBC: 5.17 10*6/uL (ref 4.20–5.80)
RDW: 13.8 % (ref 11.0–15.0)
Total Lymphocyte: 27.5 %
WBC mixed population: 775 cells/uL (ref 200–950)
WBC: 5.2 10*3/uL (ref 3.8–10.8)

## 2018-08-12 LAB — LIPID PANEL
CHOL/HDL RATIO: 3.1 (calc) (ref <5.0)
Cholesterol: 140 mg/dL (ref <200)
HDL: 45 mg/dL (ref >40)
LDL Cholesterol (Calc): 77 mg/dL (calc)
NON-HDL CHOLESTEROL (CALC): 95 mg/dL (ref <130)
Triglycerides: 96 mg/dL (ref <150)

## 2018-08-12 LAB — HEMOGLOBIN A1C
EAG (MMOL/L): 5.7 (calc)
Hgb A1c MFr Bld: 5.2 % of total Hgb (ref <5.7)
MEAN PLASMA GLUCOSE: 103 (calc)

## 2018-08-12 LAB — VITAMIN D 25 HYDROXY (VIT D DEFICIENCY, FRACTURES): Vit D, 25-Hydroxy: 18 ng/mL — ABNORMAL LOW (ref 30–100)

## 2018-08-13 DIAGNOSIS — M5412 Radiculopathy, cervical region: Secondary | ICD-10-CM | POA: Diagnosis not present

## 2018-08-14 ENCOUNTER — Ambulatory Visit (INDEPENDENT_AMBULATORY_CARE_PROVIDER_SITE_OTHER): Payer: Medicaid Other | Admitting: Family Medicine

## 2018-08-14 ENCOUNTER — Encounter: Payer: Self-pay | Admitting: Family Medicine

## 2018-08-14 VITALS — BP 136/88 | HR 81 | Temp 98.3°F | Resp 16 | Ht 71.0 in | Wt 186.0 lb

## 2018-08-14 DIAGNOSIS — M5030 Other cervical disc degeneration, unspecified cervical region: Secondary | ICD-10-CM | POA: Diagnosis not present

## 2018-08-14 DIAGNOSIS — J4530 Mild persistent asthma, uncomplicated: Secondary | ICD-10-CM

## 2018-08-14 DIAGNOSIS — F419 Anxiety disorder, unspecified: Secondary | ICD-10-CM | POA: Diagnosis not present

## 2018-08-14 DIAGNOSIS — Z0000 Encounter for general adult medical examination without abnormal findings: Secondary | ICD-10-CM | POA: Diagnosis not present

## 2018-08-14 DIAGNOSIS — K219 Gastro-esophageal reflux disease without esophagitis: Secondary | ICD-10-CM | POA: Diagnosis not present

## 2018-08-14 DIAGNOSIS — Z23 Encounter for immunization: Secondary | ICD-10-CM | POA: Diagnosis not present

## 2018-08-14 DIAGNOSIS — E559 Vitamin D deficiency, unspecified: Secondary | ICD-10-CM | POA: Diagnosis not present

## 2018-08-14 MED ORDER — PROAIR HFA 108 (90 BASE) MCG/ACT IN AERS: 2.0000 | INHALATION_SPRAY | RESPIRATORY_TRACT | 3 refills | Status: DC | PRN

## 2018-08-14 NOTE — Progress Notes (Signed)
Subjective:    Patient ID: Jeffery King, male    DOB: May 30, 1976, 42 y.o.   MRN: [id number]  Jeffery King is a 42 y.o. male presenting on 08/14/2018 for Annual Exam   HPI   Here for Annual Physical and Lab Review.  DJD Cervical Spine / Radiculopathy - Followed up with Dr Iline Oven ([facility]) on 10/24 [place] - he had steroid injection for neck recently some improvement, and he had one previously. Now states if not improved within 2 weeks will return and consider possible surgery in future.  Lifestyle / Wellness - Recent labs show normal range A1c and Lipids - No significant diet or lifestyle/exercise changes, currently trying to improve - Weight is stable without gain or loss  Anxiety - Recently refilled Xanax 1mg  TID in earlier 07/2018. Continues on current med with good results  Vitamin D Deficiency Lab still low 18. Not taking supplement  Health Maintenance: Due for Flu Shot, will receive today    Depression screen [facility] 2/9 08/14/2018 03/28/2016  Decreased Interest 0 2  Down, Depressed, Hopeless 0 1  PHQ - 2 Score 0 3  Altered sleeping - 1  Tired, decreased energy - 2  Change in appetite - 2  Feeling bad or failure about yourself  - 2  Trouble concentrating - 2  Moving slowly or fidgety/restless - 0  Suicidal thoughts - 0  PHQ-9 Score - 12  Difficult doing work/chores - Not difficult at all    Past Medical History:  Diagnosis Date  . Anxiety   . Asthma    History reviewed. No pertinent surgical history. Social History   Socioeconomic History  . Marital status: Single    Spouse name: Not on file  . Number of children: Not on file  . Years of education: Not on file  . Highest education level: Not on file  Occupational History  . Not on file  Social Needs  . Financial resource strain: Not on file  . Food insecurity:    Worry: Not on file    Inability: Not on file  . Transportation needs:    Medical: Not on file    Non-medical: Not on file  Tobacco Use    . Smoking status: Former Smoker    Last attempt to quit: 05/30/2014    Years since quitting: 4.2  . Smokeless tobacco: Former Engineer, water and Sexual Activity  . Alcohol use: No  . Drug use: No  . Sexual activity: Not on file  Lifestyle  . Physical activity:    Days per week: Not on file    Minutes per session: Not on file  . Stress: Not on file  Relationships  . Social connections:    Talks on phone: Not on file    Gets together: Not on file    Attends religious service: Not on file    Active member of club or organization: Not on file    Attends meetings of clubs or organizations: Not on file    Relationship status: Not on file  . Intimate partner violence:    Fear of current or ex partner: Not on file    Emotionally abused: Not on file    Physically abused: Not on file    Forced sexual activity: Not on file  Other Topics Concern  . Not on file  Social History Narrative  . Not on file   Family History  Problem Relation Age of Onset  . Hypertension Mother   .  Hypertension Father   . Hypertension Brother    Current Outpatient Medications on File Prior to Visit  Medication Sig  . ALPRAZolam (XANAX) 1 MG tablet TAKE ONE TABLET 3 TIMES DAILY  . baclofen (LIORESAL) 10 MG tablet Take 0.5-1 tablets (5-10 mg total) by mouth 3 (three) times daily as needed for muscle spasms.  . busPIRone (BUSPAR) 7.5 MG tablet Take 0.5 tablets (3.75 mg total) by mouth daily.  . fluticasone (FLOVENT HFA) 44 MCG/ACT inhaler Inhale 1 puff into the lungs 2 (two) times daily.  Marland Kitchen loratadine (CLARITIN) 10 MG tablet Take 1 tablet (10 mg total) by mouth daily. Use for 4-6 weeks then stop, and use as needed or seasonally  . montelukast (SINGULAIR) 10 MG tablet Take 1 tablet (10 mg total) by mouth at bedtime.  Marland Kitchen omeprazole (PRILOSEC) 40 MG capsule Take 1 capsule (40 mg total) by mouth daily before breakfast.  . sucralfate (CARAFATE) 1 g tablet Take 1 tablet (1 g total) by mouth 4 (four) times daily -   with meals and at bedtime.   No current facility-administered medications on file prior to visit.     Review of Systems  Constitutional: Negative for activity change, appetite change, chills, diaphoresis, fatigue and fever.  HENT: Negative for congestion and hearing loss.   Eyes: Negative for visual disturbance.  Respiratory: Negative for apnea, cough, choking, chest tightness, shortness of breath and wheezing.   Cardiovascular: Negative for chest pain, palpitations and leg swelling.  Gastrointestinal: Negative for abdominal pain, anal bleeding, blood in stool, constipation, diarrhea, nausea and vomiting.  Endocrine: Negative for cold intolerance.  Genitourinary: Negative for difficulty urinating, dysuria, frequency and hematuria.  Musculoskeletal: Positive for neck pain. Negative for arthralgias.  Skin: Negative for rash.  Allergic/Immunologic: Negative for environmental allergies.  Neurological: Negative for dizziness, weakness, light-headedness, numbness and headaches.  Hematological: Negative for adenopathy.  Psychiatric/Behavioral: Negative for behavioral problems, dysphoric mood and sleep disturbance. The patient is not nervous/anxious (improved).    Per HPI unless specifically indicated above     Objective:    BP 136/88   Pulse 81   Temp 98.3 F (36.8 C) (Oral)   Resp 16   Ht 5\' 11"  (1.803 m)   Wt 186 lb (84.4 kg)   BMI 25.94 kg/m   Wt Readings from Last 3 Encounters:  08/14/18 186 lb (84.4 kg)  06/12/18 185 lb (83.9 kg)  06/11/18 186 lb (84.4 kg)    Physical Exam  Constitutional: He is oriented to person, place, and time. He appears well-developed and well-nourished. No distress.  Well-appearing, comfortable, cooperative  HENT:  Head: Normocephalic and atraumatic.  Mouth/Throat: Oropharynx is clear and moist.  Frontal / maxillary sinuses non-tender. Nares patent without purulence or edema. Bilateral TMs clear without erythema, effusion or bulging. Oropharynx  clear without erythema, exudates, edema or asymmetry.  Eyes: Pupils are equal, round, and reactive to light. Conjunctivae and EOM are normal. Right eye exhibits no discharge. Left eye exhibits no discharge.  Neck: Normal range of motion. Neck supple. No thyromegaly present.  Cardiovascular: Normal rate, regular rhythm, normal heart sounds and intact distal pulses.  No murmur heard. Pulmonary/Chest: Effort normal and breath sounds normal. No respiratory distress. He has no wheezes. He has no rales.  Abdominal: Soft. Bowel sounds are normal. He exhibits no distension and no mass. There is no tenderness.  Musculoskeletal: Normal range of motion. He exhibits no edema or tenderness.  Upper / Lower Extremities: - Normal muscle tone, strength bilateral upper extremities  5/5, lower extremities 5/5  Lymphadenopathy:    He has no cervical adenopathy.  Neurological: He is alert and oriented to person, place, and time.  Distal sensation intact to light touch all extremities  Skin: Skin is warm and dry. No rash noted. He is not diaphoretic. No erythema.  Psychiatric: He has a normal mood and affect. His behavior is normal.  Well groomed, good eye contact, normal speech and thoughts  Nursing note and vitals reviewed.  Results for orders placed or performed in visit on 07/10/18  VITAMIN D 25 Hydroxy (Vit-D Deficiency, Fractures)  Result Value Ref Range   Vit D, 25-Hydroxy 18 (L) 30 - 100 ng/mL  Hemoglobin A1c  Result Value Ref Range   Hgb A1c MFr Bld 5.2 <5.7 % of total Hgb   Mean Plasma Glucose 103 (calc)   eAG (mmol/L) 5.7 (calc)  CBC with Differential/Platelet  Result Value Ref Range   WBC 5.2 3.8 - 10.8 Thousand/uL   RBC 5.17 4.20 - 5.80 Million/uL   Hemoglobin 15.8 13.2 - 17.1 g/dL   HCT 16.1 09.6 - 04.5 %   MCV 88.4 80.0 - 100.0 fL   MCH 30.6 27.0 - 33.0 pg   MCHC 34.6 32.0 - 36.0 g/dL   RDW 40.9 81.1 - 91.4 %   Platelets 195 140 - 400 Thousand/uL   MPV 11.8 7.5 - 12.5 fL   Neutro Abs  2,855 1,500 - 7,800 cells/uL   Lymphs Abs 1,430 850 - 3,900 cells/uL   WBC mixed population 775 200 - 950 cells/uL   Eosinophils Absolute 109 15 - 500 cells/uL   Basophils Absolute 31 0 - 200 cells/uL   Neutrophils Relative % 54.9 %   Total Lymphocyte 27.5 %   Monocytes Relative 14.9 %   Eosinophils Relative 2.1 %   Basophils Relative 0.6 %  COMPLETE METABOLIC PANEL WITH GFR  Result Value Ref Range   Glucose, Bld 86 65 - 99 mg/dL   BUN 10 7 - 25 mg/dL   Creat 7.82 9.56 - 2.13 mg/dL   GFR, Est Non African American 89 > OR = 60 mL/min/1.38m2   GFR, Est African American 103 > OR = 60 mL/min/1.33m2   BUN/Creatinine Ratio NOT APPLICABLE 6 - 22 (calc)   Sodium 140 135 - 146 mmol/L   Potassium 4.5 3.5 - 5.3 mmol/L   Chloride 105 98 - 110 mmol/L   CO2 27 20 - 32 mmol/L   Calcium 9.4 8.6 - 10.3 mg/dL   Total Protein 7.3 6.1 - 8.1 g/dL   Albumin 4.1 3.6 - 5.1 g/dL   Globulin 3.2 1.9 - 3.7 g/dL (calc)   AG Ratio 1.3 1.0 - 2.5 (calc)   Total Bilirubin 0.6 0.2 - 1.2 mg/dL   Alkaline phosphatase (APISO) 86 40 - 115 U/L   AST 28 10 - 40 U/L   ALT 33 9 - 46 U/L  Lipid panel  Result Value Ref Range   Cholesterol 140 <200 mg/dL   HDL 45 >08 mg/dL   Triglycerides 96 <657 mg/dL   LDL Cholesterol (Calc) 77 mg/dL (calc)   Total CHOL/HDL Ratio 3.1 <5.0 (calc)   Non-HDL Cholesterol (Calc) 95 <846 mg/dL (calc)      Assessment & Plan:   Problem List Items Addressed This Visit    Anxiety Stable chronic problem Controlled on Xanax 1mg  TID, already UTD refilled 07/2018 Follow-up as planned    Degenerative disc disease, cervical Complicated w/ radiculopathy Followed by Orthopedics / Spine - has seen  Dr Dimmig recently at [facility] for epidural injection, then will follow-up in near future if not improved may need repeat future surgery    Gastroesophageal reflux disease without esophagitis Stable controlled    Mild persistent asthma Stable without flare Refill Albuterol    Relevant  Medications   PROAIR HFA 108 (90 Base) MCG/ACT inhaler   Vitamin D insufficiency Low Vitamin D  Start OTC Vitamin D3 5,000 iu daily for 12 weeks then reduce to OTC Vitamin D3 2,000 iu daily for maintenance     Other Visit Diagnoses    Annual physical exam    -  Primary Updated Health Maintenance information Reviewed recent lab results with patient Encouraged improvement to lifestyle with diet and exercise    Needs flu shot       Relevant Orders   Flu Vaccine QUAD 36+ mos IM (Completed)      Meds ordered this encounter  Medications  . PROAIR HFA 108 (90 Base) MCG/ACT inhaler    Sig: Inhale 2 puffs into the lungs every 4 (four) hours as needed for wheezing or shortness of breath.    Dispense:  8.5 g    Refill:  3      Follow up plan: Return in about 6 months (around 02/13/2019) for 6 month follow-up Anxiety, GERD, Neck Pain - med refills (Update contract).  [name], DO [facility] 08/14/2018, 7:27 PM

## 2018-08-14 NOTE — Patient Instructions (Addendum)
Thank you for coming to the office today.  Vitamin D is still low, - Start OTC Vitamin D3 5,000 iu daily for 12 weeks then reduce to OTC Vitamin D3 2,000 iu daily for maintenance  Refilled ProAir  You should have Xanax refill available - at pharmacy - sent on 07/29/18 for 3 month supply.  Results should be available in MyChart within 24 hours.  Flu shot today  Follow-up with Dr Iline Oven as planned.  Please schedule a Follow-up Appointment to: Return in about 6 months (around 02/13/2019) for 6 month follow-up Anxiety, GERD, Neck Pain - med refills (Update contract).  If you have any other questions or concerns, please feel free to call the office or send a message through MyChart. You may also schedule an earlier appointment if necessary.  Additionally, you may be receiving a survey about your experience at our office within a few days to 1 week by e-mail or mail. We value your feedback.  [name], DO [facility], [place]

## 2018-10-28 ENCOUNTER — Other Ambulatory Visit: Payer: Self-pay | Admitting: Family Medicine

## 2018-10-28 DIAGNOSIS — F419 Anxiety disorder, unspecified: Secondary | ICD-10-CM

## 2018-11-20 ENCOUNTER — Telehealth: Payer: Self-pay | Admitting: Family Medicine

## 2018-11-20 NOTE — Telephone Encounter (Signed)
Patient has refills on all these mediation.  Asked patient to contact pharmacy first.

## 2018-11-20 NOTE — Telephone Encounter (Signed)
Pt needs refill on omeprazole, bispirone, alprazolam, claritin and proair .  His call back [phone]

## 2019-01-19 ENCOUNTER — Other Ambulatory Visit: Payer: Self-pay | Admitting: Family Medicine

## 2019-01-19 DIAGNOSIS — J3089 Other allergic rhinitis: Secondary | ICD-10-CM

## 2019-01-19 DIAGNOSIS — J4530 Mild persistent asthma, uncomplicated: Secondary | ICD-10-CM

## 2019-02-15 ENCOUNTER — Other Ambulatory Visit: Payer: Self-pay

## 2019-02-15 ENCOUNTER — Ambulatory Visit (INDEPENDENT_AMBULATORY_CARE_PROVIDER_SITE_OTHER): Payer: Medicaid Other | Admitting: Family Medicine

## 2019-02-15 ENCOUNTER — Encounter: Payer: Self-pay | Admitting: Family Medicine

## 2019-02-15 ENCOUNTER — Other Ambulatory Visit: Payer: Self-pay | Admitting: Family Medicine

## 2019-02-15 DIAGNOSIS — J4530 Mild persistent asthma, uncomplicated: Secondary | ICD-10-CM

## 2019-02-15 DIAGNOSIS — F419 Anxiety disorder, unspecified: Secondary | ICD-10-CM | POA: Diagnosis not present

## 2019-02-15 DIAGNOSIS — J3089 Other allergic rhinitis: Secondary | ICD-10-CM | POA: Diagnosis not present

## 2019-02-15 DIAGNOSIS — K219 Gastro-esophageal reflux disease without esophagitis: Secondary | ICD-10-CM

## 2019-02-15 DIAGNOSIS — E559 Vitamin D deficiency, unspecified: Secondary | ICD-10-CM

## 2019-02-15 DIAGNOSIS — Z0000 Encounter for general adult medical examination without abnormal findings: Secondary | ICD-10-CM

## 2019-02-15 MED ORDER — ALPRAZOLAM 1 MG PO TABS: 1.0000 mg | ORAL_TABLET | Freq: Three times a day (TID) | ORAL | 5 refills | Status: DC

## 2019-02-15 MED ORDER — ALPRAZOLAM 1 MG PO TABS: 1.0000 mg | ORAL_TABLET | Freq: Three times a day (TID) | ORAL | 2 refills | Status: DC

## 2019-02-15 MED ORDER — OMEPRAZOLE 40 MG PO CPDR: 40.0000 mg | DELAYED_RELEASE_CAPSULE | Freq: Every day | ORAL | 3 refills | Status: DC

## 2019-02-15 NOTE — Assessment & Plan Note (Addendum)
Improved GERD overall with diet changes Seems dramatically improved chest discomfort as well - No GI red flag symptoms - Chronic history of GERD  Plan: 1. Continue Omeprazole 40mg , has reduced some use. Rarely using sucralfate 2. Diet modifications reduce GERD 3. Future follow-up consider GI if not improving

## 2019-02-15 NOTE — Patient Instructions (Addendum)
Refilled all medicines   DUE for FASTING BLOOD WORK (no food or drink after midnight before the lab appointment, only water or coffee without cream/sugar on the morning of)  SCHEDULE "Lab Only" visit in the morning at the clinic for lab draw in 6 MONTHS   - Make sure Lab Only appointment is at about 1 week before your next appointment, so that results will be available  For Lab Results, once available within 2-3 days of blood draw, you can can log in to MyChart online to view your results and a brief explanation. Also, we can discuss results at next follow-up visit.   Please schedule a Follow-up Appointment to: Return in about 6 months (around 08/17/2019) for Annual Physical.  If you have any other questions or concerns, please feel free to call the office or send a message through MyChart. You may also schedule an earlier appointment if necessary.  Additionally, you may be receiving a survey about your experience at our office within a few days to 1 week by e-mail or mail. We value your feedback.  [name], DO [facility], [place]

## 2019-02-15 NOTE — Assessment & Plan Note (Signed)
Improving Stable, well controlled anxiety with panic, now without panic attacks - H/o >25 years, on xanax stable >5 years - Prior failed SSRI unsure - Checked [place] CSRS today for 2 year, appropriate filling of xanax only  Plan: 1. Re order Alprazolam 1mg  TID - #90, +5 refills (changed from 3 month overall to 6 month) - Continue Buspar 7.5mg  low dose half tab daily - skips dose sometimes now every other day, taper down

## 2019-02-15 NOTE — Progress Notes (Signed)
Virtual Visit via Telephone The purpose of this virtual visit is to provide medical care while limiting exposure to the novel coronavirus (COVID19) for both patient and office staff.  Consent was obtained for phone visit:  Yes.   Answered questions that patient had about telehealth interaction:  Yes.   I discussed the limitations, risks, security and privacy concerns of performing an evaluation and management service by telephone. I also discussed with the patient that there may be a patient responsible charge related to this service. The patient expressed understanding and agreed to proceed.  Patient Location: Home [facility]  ---------------------------------------------------------------------- Chief Complaint  Patient presents with  . Anxiety    obtw SOB, fever was 101 but not now was month ago    S: Reviewed CMA documentation. I have called patient and gathered additional HPI as follows:  Follow-up Episode of Shortness of Breath / Fever He reports prior, 3-4 weeks ago, had episode of fever and shortness of breath but this has resolved - he feels nearly 100% better now. He has history of Asthma.  Anxiety He has been managing his anxiety and overall has improved his in general, feels more emotional in a good way and more like himself. He continues on Xanax 1mg  TID - still trying to wean gradually occasional reduce dose Reduced Buspar down to half tablet of 7.5mg  - daily, sometimes he has reduced it down to every other day  GERD He has changed his eating habits with significant improvement. Now taking Omeprazole PRN with very good results. His chest tightness has improved as well. He has discontinued junk food and sodas  Asthma Currently controlled on Albuterol PRN. He is not using Flovent. Did not do well with Advair. Not on Singulair. He has done breathing exercises at home, doing well.  Denies any high risk travel to areas of current  concern for COVID19. Denies any known or suspected exposure to person with or possibly with COVID19.  Denies any fevers, chills, sweats, body ache, cough, shortness of breath, sinus pain or pressure, headache, abdominal pain, diarrhea  Past Medical History:  Diagnosis Date  . Anxiety   . Asthma    Social History   Tobacco Use  . Smoking status: Former Smoker    Last attempt to quit: 05/30/2014    Years since quitting: 4.7  . Smokeless tobacco: Former Engineer, waterUser  Substance Use Topics  . Alcohol use: No  . Drug use: No    Current Outpatient Medications:  .  ALPRAZolam (XANAX) 1 MG tablet, Take 1 tablet (1 mg total) by mouth 3 (three) times daily., Disp: 90 tablet, Rfl: 5 .  baclofen (LIORESAL) 10 MG tablet, Take 0.5-1 tablets (5-10 mg total) by mouth 3 (three) times daily as needed for muscle spasms., Disp: 90 each, Rfl: 2 .  loratadine (CLARITIN) 10 MG tablet, Take 10 mg by mouth daily., Disp: , Rfl:  .  omeprazole (PRILOSEC) 40 MG capsule, Take 1 capsule (40 mg total) by mouth daily before breakfast., Disp: 90 capsule, Rfl: 3 .  PROAIR HFA 108 (90 Base) MCG/ACT inhaler, INHALE 2 PUFFS INTO THE LUNGS EVERY 4 HOURS AS NEEDED FOR SHORTNESS OF BREATH OR WHEEZING, Disp: 8.5 g, Rfl: 3 .  busPIRone (BUSPAR) 7.5 MG tablet, TAKE 1/2 TABLET EVERY DAY (Patient not taking: Reported on 02/15/2019), Disp: 45 tablet, Rfl: 2  [facility] 02/15/2019 08/14/2018 03/28/2016  Decreased Interest 0 0 2  Down, Depressed, Hopeless 0 0 1  PHQ -  2 Score 0 0 3  Altered sleeping - - 1  Tired, decreased energy - - 2  Change in appetite - - 2  Feeling bad or failure about yourself  - - 2  Trouble concentrating - - 2  Moving slowly or fidgety/restless - - 0  Suicidal thoughts - - 0  PHQ-9 Score - - 12  Difficult doing work/chores - - Not difficult at all    GAD 7 : Generalized Anxiety Score 02/15/2019 06/01/2018 05/30/2017 11/13/2016  Nervous, Anxious, on Edge (No Data) (No Data) 0 (No Data)   Control/stop worrying - - 1 -  Worry too much - different things - - 0 -  Trouble relaxing - - 0 -  Restless - - 0 -  Easily annoyed or irritable - - 0 -  Afraid - awful might happen - - 0 -  Total GAD 7 Score - - 1 -  Anxiety Difficulty - - Not difficult at all -    -------------------------------------------------------------------------- O: No physical exam performed due to remote telephone encounter.  Lab results reviewed.  No results found for this or any previous visit (from the past 2160 hour(s)).  -------------------------------------------------------------------------- A&P:  Problem List Items Addressed This Visit    Anxiety - Primary    Improving Stable, well controlled anxiety with panic, now without panic attacks - H/o >25 years, on xanax stable >5 years - Prior failed SSRI unsure - Checked [place] CSRS today for 2 year, appropriate filling of xanax only  Plan: 1. Re order Alprazolam 1mg  TID - #90, +5 refills (changed from 3 month overall to 6 month) - Continue Buspar 7.5mg  low dose half tab daily - skips dose sometimes now every other day, taper down      Relevant Medications   ALPRAZolam (XANAX) 1 MG tablet   Environmental and seasonal allergies   Gastroesophageal reflux disease without esophagitis    Improved GERD overall with diet changes Seems dramatically improved chest discomfort as well - No GI red flag symptoms - Chronic history of GERD  Plan: 1. Continue Omeprazole 40mg , has reduced some use. Rarely using sucralfate 2. Diet modifications reduce GERD 3. Future follow-up consider GI if not improving      Relevant Medications   omeprazole (PRILOSEC) 40 MG capsule   Mild persistent asthma    Improved off Advair, Flovent, Singulair Chronic asthma, mild intermittent vs mild persistent, without acute exac. - Trigger with environmental allergies  Plan: 1. Monitor asthma 2. Use Albuterol PRN 3. Continue Loratadine for allergies         Meds  ordered this encounter  Medications  . omeprazole (PRILOSEC) 40 MG capsule    Sig: Take 1 capsule (40 mg total) by mouth daily before breakfast.    Dispense:  90 capsule    Refill:  3  . DISCONTD: ALPRAZolam (XANAX) 1 MG tablet    Sig: Take 1 tablet (1 mg total) by mouth 3 (three) times daily.    Dispense:  90 tablet    Refill:  2  . ALPRAZolam (XANAX) 1 MG tablet    Sig: Take 1 tablet (1 mg total) by mouth 3 (three) times daily.    Dispense:  90 tablet    Refill:  5    Please update to 5 refills, instead of 3 refills for this order. Thank you    Follow-up: - Return in 6 months for Annual Physical - Future labs ordered for fasting lab only then 1 week later Annual Physical  Patient  verbalizes understanding with the above medical recommendations including the limitation of remote medical advice.  Specific follow-up and call-back criteria were given for patient to follow-up or seek medical care more urgently if needed.  - Time spent in direct consultation with patient on phone: 19 minutes   [name], DO [facility] 02/15/2019, 8:19 AM

## 2019-02-15 NOTE — Assessment & Plan Note (Signed)
Improved off Advair, Flovent, Singulair Chronic asthma, mild intermittent vs mild persistent, without acute exac. - Trigger with environmental allergies  Plan: 1. Monitor asthma 2. Use Albuterol PRN 3. Continue Loratadine for allergies

## 2019-07-05 ENCOUNTER — Other Ambulatory Visit: Payer: Self-pay

## 2019-07-05 ENCOUNTER — Other Ambulatory Visit: Payer: Self-pay | Admitting: Family Medicine

## 2019-07-05 ENCOUNTER — Encounter: Payer: Self-pay | Admitting: Family Medicine

## 2019-07-05 ENCOUNTER — Ambulatory Visit (INDEPENDENT_AMBULATORY_CARE_PROVIDER_SITE_OTHER): Payer: Medicaid Other | Admitting: Family Medicine

## 2019-07-05 DIAGNOSIS — H01001 Unspecified blepharitis right upper eyelid: Secondary | ICD-10-CM

## 2019-07-05 DIAGNOSIS — F419 Anxiety disorder, unspecified: Secondary | ICD-10-CM

## 2019-07-05 MED ORDER — CLINDAMYCIN HCL 300 MG PO CAPS: 300.0000 mg | ORAL_CAPSULE | Freq: Three times a day (TID) | ORAL | 0 refills | Status: DC

## 2019-07-05 MED ORDER — ALPRAZOLAM 1 MG PO TABS: 1.0000 mg | ORAL_TABLET | Freq: Three times a day (TID) | ORAL | 5 refills | Status: DC

## 2019-07-05 NOTE — Progress Notes (Signed)
Virtual Visit via Telephone The purpose of this virtual visit is to provide medical care while limiting exposure to the novel coronavirus (COVID19) for both patient and office staff.  Consent was obtained for phone visit:  Yes.   Answered questions that patient had about telehealth interaction:  Yes.   I discussed the limitations, risks, security and privacy concerns of performing an evaluation and management service by telephone. I also discussed with the patient that there may be a patient responsible charge related to this service. The patient expressed understanding and agreed to proceed.  Patient Location: Home Provider Location: [facility])  ---------------------------------------------------------------------- Chief Complaint  Patient presents with  . Eye Pain    swelling Right side starting to progress in Left as well onset 3 days     S: Reviewed CMA documentation. I have called patient and gathered additional HPI as follows:  Right Eye Blepharitis, swelling eyelid upper Reports that symptoms started 3 days ago similar to past experience of blepharitis, was mild tender and itchy, worsening recently, he is interested in similar treatment as before, resolved with clinadmycin oral for 10 days. No new concern for possible MRSA exposure however. Admits notable redness of eyelid Admits some mild yellow drainage out of eye. He thinks early symptoms in Left eye developing Denies any spreading redness . Denies any high risk travel to areas of current concern for COVID19. Denies any known or suspected exposure to person with or possibly with COVID19.  Denies any fevers, chills, sweats, body ache, cough, shortness of breath, sinus pain or pressure, headache, abdominal pain, diarrhea  Past Medical History:  Diagnosis Date  . Anxiety   . Asthma    Social History   Tobacco Use  . Smoking status: Former Smoker    Quit date: 05/30/2014    Years since quitting:  5.1  . Smokeless tobacco: Former Network engineer Use Topics  . Alcohol use: No  . Drug use: No    Current Outpatient Medications:  .  baclofen (LIORESAL) 10 MG tablet, Take 0.5-1 tablets (5-10 mg total) by mouth 3 (three) times daily as needed for muscle spasms., Disp: 90 each, Rfl: 2 .  busPIRone (BUSPAR) 7.5 MG tablet, TAKE 1/2 TABLET EVERY DAY, Disp: 45 tablet, Rfl: 2 .  loratadine (CLARITIN) 10 MG tablet, Take 10 mg by mouth daily., Disp: , Rfl:  .  omeprazole (PRILOSEC) 40 MG capsule, Take 1 capsule (40 mg total) by mouth daily before breakfast., Disp: 90 capsule, Rfl: 3 .  PROAIR HFA 108 (90 Base) MCG/ACT inhaler, INHALE 2 PUFFS INTO THE LUNGS EVERY 4 HOURS AS NEEDED FOR SHORTNESS OF BREATH OR WHEEZING, Disp: 8.5 g, Rfl: 3 .  ALPRAZolam (XANAX) 1 MG tablet, Take 1 tablet (1 mg total) by mouth 3 (three) times daily., Disp: 90 tablet, Rfl: 5 .  clindamycin (CLEOCIN) 300 MG capsule, Take 1 capsule (300 mg total) by mouth 3 (three) times daily. For 10, Disp: 30 capsule, Rfl: 0  Depression screen [facility] 2/9 02/15/2019 08/14/2018 03/28/2016  Decreased Interest 0 0 2  Down, Depressed, Hopeless 0 0 1  PHQ - 2 Score 0 0 3  Altered sleeping - - 1  Tired, decreased energy - - 2  Change in appetite - - 2  Feeling bad or failure about yourself  - - 2  Trouble concentrating - - 2  Moving slowly or fidgety/restless - - 0  Suicidal thoughts - - 0  PHQ-9 Score - - 12  Difficult doing  work/chores - - Not difficult at all    GAD 7 : Generalized Anxiety Score 02/15/2019 06/01/2018 05/30/2017 11/13/2016  Nervous, Anxious, on Edge (No Data) (No Data) 0 (No Data)  Control/stop worrying - - 1 -  Worry too much - different things - - 0 -  Trouble relaxing - - 0 -  Restless - - 0 -  Easily annoyed or irritable - - 0 -  Afraid - awful might happen - - 0 -  Total GAD 7 Score - - 1 -  Anxiety Difficulty - - Not difficult at all -     -------------------------------------------------------------------------- O: No physical exam performed due to remote telephone encounter.  Lab results reviewed.  No results found for this or any previous visit (from the past 2160 hour(s)).  -------------------------------------------------------------------------- A&P:  Problem List Items Addressed This Visit    None    Visit Diagnoses    Blepharitis of right upper eyelid, unspecified type    -  Primary   Relevant Medications   clindamycin (CLEOCIN) 300 MG capsule     Clinically consistent with acute R upper eyelid blepharitis vs pre-septal cellulitis without evidence of concerning systemic symptoms, unlikely orbital cellulitis based on reported history and prior episode 05/2018 treated with clindamycin successfully  Plan: 1. Start antibiotics with potential community acq MRSA coverage with Clindamycin 300mg  TID x 10 days, since not quite clinically consistent with actual cellulitis, will defer adding 2nd antibiotic therapy augmentin at this time. Advised strict return criteria if not improving by 48 hours call us back will add augmentin 2. Supportive care - cool vs warm compresses, motrin/tylenol PRN, hand hygiene 3. Strict return criteria if worsening  Also consider add ophthal antibiotic drop if indicated - eye irritation / persistent drainage  Meds ordered this encounter  Medications  . clindamycin (CLEOCIN) 300 MG capsule    Sig: Take 1 capsule (300 mg total) by mouth 3 (three) times daily. For 10    Dispense:  30 capsule    Refill:  0    Follow-up: - Return within 1 week as needed if not improved  Patient verbalizes understanding with the above medical recommendations including the limitation of remote medical advice.  Specific follow-up and call-back criteria were given for patient to follow-up or seek medical care more urgently if needed.   - Time spent in direct consultation with patient on phone: 9  minutes  Saralyn PilarAlexander Karamalegos, DO [facility] 07/05/2019, 11:47 AM

## 2019-07-05 NOTE — Patient Instructions (Addendum)
Likely Blepheritis or eyelid infection - can have blocked tear ducts  I don't think that this is the severe spreading infection type - as we discussed. But will cover with antibiotic now regardless.  If not improved then let us know if need additional antibiotics.  - Initial treatment is warm compresses up to 4 to 6 times a day using warm washcloth place over eyelid and apply gentle pressure, hold this on for 10-15 min at a time, can re-warm if cools down  It may drain pus and reduce in size, this is ideal, otherwise if significant worsening with spreading of redness or swelling onto eyelid extending around the whole eye area around face, unable to see, fevers/chills, then please return to clinic sooner or call, may go to Emergency Dept if significant worsening  Otherwise, if improving but not going away after 1-2 weeks, call us back and we will refer you to Ophthalmology if you develop a stye or boil or cyst  Refilled alprazolam - sent to pharmacy.  Please schedule a Follow-up Appointment to: Return in about 1 week (around 07/12/2019), or if symptoms worsen or fail to improve, for eye infection.  If you have any other questions or concerns, please feel free to call the office or send a message through [place]. You may also schedule an earlier appointment if necessary.  Additionally, you may be receiving a survey about your experience at our office within a few days to 1 week by e-mail or mail. We value your feedback.  [name], DO [place]

## 2019-08-04 ENCOUNTER — Other Ambulatory Visit: Payer: Self-pay | Admitting: Family Medicine

## 2019-08-04 DIAGNOSIS — J4530 Mild persistent asthma, uncomplicated: Secondary | ICD-10-CM

## 2019-08-10 ENCOUNTER — Other Ambulatory Visit: Payer: Medicaid Other

## 2019-08-17 ENCOUNTER — Encounter: Payer: Medicaid Other | Admitting: Family Medicine

## 2019-09-06 ENCOUNTER — Telehealth: Payer: Self-pay | Admitting: Family Medicine

## 2019-09-06 NOTE — Telephone Encounter (Signed)
Copied message from [place])  Patient called and was very anxious regarding a missed appointment for his physical. He stated he did not remember making the appointment and that due to COVID-19 he would have not felt comfortable coming in anyway. He stated he did not want to reschedule at this time because he felt healthy and was still worried about coming out in public too much. He then stated he was having neck pain as well as hand pain. I advised him if his symptoms were bothersome to schedule an appointment to be evaluated but he declined. He was focused on not being penalized for the no-show. I assured him we understood his concerns and he was "ok with [place]." He pleaded to receive a "note, text or some sort of message" from you personally to relieve his anxiety over this. Thanks in advance.    Can you call patient to clarify with him that it is okay and we will not penalize him for this no show and review our policy.  [name], [facility] 09/06/2019, 10:19 AM

## 2019-11-29 ENCOUNTER — Other Ambulatory Visit: Payer: Self-pay | Admitting: Family Medicine

## 2019-11-29 DIAGNOSIS — J4530 Mild persistent asthma, uncomplicated: Secondary | ICD-10-CM

## 2019-12-21 ENCOUNTER — Other Ambulatory Visit: Payer: Self-pay | Admitting: Family Medicine

## 2019-12-21 DIAGNOSIS — F419 Anxiety disorder, unspecified: Secondary | ICD-10-CM

## 2020-02-25 ENCOUNTER — Other Ambulatory Visit: Payer: Self-pay | Admitting: Family Medicine

## 2020-02-25 NOTE — Telephone Encounter (Signed)
Requested medication (s) are due for refill today- yes  Requested medication (s) are on the active medication list -yes  Future visit scheduled -no  Last refill: historical   Notes to clinic: Request for new Rx- historical- Claritin 10 mg  Requested Prescriptions  Pending Prescriptions Disp Refills   ALLERGY RELIEF 10 MG tablet [Pharmacy Med Name: ALLERGY RELIEF 10 MG TAB] 90 tablet     Sig: TAKE ONE TABLET BY MOUTH EVERY DAY FOR 4-6 WEEKS THEN AS NEEDED      Ear, Nose, and Throat:  Antihistamines Passed - 02/25/2020  9:06 AM      Passed - Valid encounter within last 12 months    Recent Outpatient Visits           7 months ago Blepharitis of right upper eyelid, unspecified type   [facility] [place], Netta Neat, DO   1 year ago Anxiety   [facility] Al[facility]a Charon, Netta Neat, DO   1 year ago Annual physical exam   [facility] Smitty Cords, DO   1 year ago Blepharitis of left upper eyelid, unspecified type   [facility] [place], Netta Neat, DO   2 years ago Gastroesophageal reflux disease without esophagitis   [facility] Charon, Netta Neat, DO                  Requested Prescriptions  Pending Prescriptions Disp Refills   ALLERGY RELIEF 10 MG tablet [Pharmacy Med Name: ALLERGY RELIEF 10 MG TAB] 90 tablet     Sig: TAKE ONE TABLET BY MOUTH EVERY DAY FOR 4-6 WEEKS THEN AS NEEDED      Ear, Nose, and Throat:  Antihistamines Passed - 02/25/2020  9:06 AM      Passed - Valid encounter within last 12 months    Recent Outpatient Visits           7 months ago Blepharitis of right upper eyelid, unspecified type   [facility] [place], Netta Neat, DO   1 year ago Anxiety   [facility] Althea Charon, Netta Neat, DO   1 year ago Annual physical exam   [facility] Smitty Cords, DO   1 year ago Blepharitis of left  upper eyelid, unspecified type   [facility] [place], Netta Neat, DO   2 years ago Gastroesophageal reflux disease without esophagitis   [facility] [place], Netta Neat, DO

## 2020-03-06 ENCOUNTER — Ambulatory Visit (INDEPENDENT_AMBULATORY_CARE_PROVIDER_SITE_OTHER): Payer: Medicaid Other | Admitting: Family Medicine

## 2020-03-06 ENCOUNTER — Encounter: Payer: Self-pay | Admitting: Family Medicine

## 2020-03-06 ENCOUNTER — Other Ambulatory Visit: Payer: Self-pay

## 2020-03-06 VITALS — BP 136/96 | HR 85 | Temp 98.0°F | Resp 16 | Ht 71.0 in | Wt 185.0 lb

## 2020-03-06 DIAGNOSIS — H6122 Impacted cerumen, left ear: Secondary | ICD-10-CM

## 2020-03-06 DIAGNOSIS — J4530 Mild persistent asthma, uncomplicated: Secondary | ICD-10-CM | POA: Diagnosis not present

## 2020-03-06 DIAGNOSIS — E559 Vitamin D deficiency, unspecified: Secondary | ICD-10-CM | POA: Diagnosis not present

## 2020-03-06 DIAGNOSIS — J3089 Other allergic rhinitis: Secondary | ICD-10-CM

## 2020-03-06 DIAGNOSIS — Z0000 Encounter for general adult medical examination without abnormal findings: Secondary | ICD-10-CM

## 2020-03-06 DIAGNOSIS — K219 Gastro-esophageal reflux disease without esophagitis: Secondary | ICD-10-CM | POA: Diagnosis not present

## 2020-03-06 DIAGNOSIS — Z125 Encounter for screening for malignant neoplasm of prostate: Secondary | ICD-10-CM | POA: Diagnosis not present

## 2020-03-06 DIAGNOSIS — H6123 Impacted cerumen, bilateral: Secondary | ICD-10-CM | POA: Diagnosis not present

## 2020-03-06 DIAGNOSIS — R7309 Other abnormal glucose: Secondary | ICD-10-CM

## 2020-03-06 DIAGNOSIS — F419 Anxiety disorder, unspecified: Secondary | ICD-10-CM | POA: Diagnosis not present

## 2020-03-06 DIAGNOSIS — M5030 Other cervical disc degeneration, unspecified cervical region: Secondary | ICD-10-CM

## 2020-03-06 MED ORDER — BACLOFEN 10 MG PO TABS: 5.0000 mg | ORAL_TABLET | Freq: Three times a day (TID) | ORAL | 1 refills | Status: DC | PRN

## 2020-03-06 MED ORDER — PROAIR HFA 108 (90 BASE) MCG/ACT IN AERS: INHALATION_SPRAY | RESPIRATORY_TRACT | 5 refills | Status: DC

## 2020-03-06 MED ORDER — LORATADINE 10 MG PO TABS: 10.0000 mg | ORAL_TABLET | Freq: Every day | ORAL | 3 refills | Status: DC

## 2020-03-06 MED ORDER — OMEPRAZOLE 40 MG PO CPDR: 40.0000 mg | DELAYED_RELEASE_CAPSULE | Freq: Every day | ORAL | 3 refills | Status: DC

## 2020-03-06 MED ORDER — ALPRAZOLAM 1 MG PO TABS: 1.0000 mg | ORAL_TABLET | Freq: Three times a day (TID) | ORAL | 5 refills | Status: DC

## 2020-03-06 MED ORDER — ALPRAZOLAM 1 MG PO TABS: 1.0000 mg | ORAL_TABLET | Freq: Three times a day (TID) | ORAL | 2 refills | Status: DC

## 2020-03-06 NOTE — Progress Notes (Signed)
Subjective:    Patient ID: Jeffery King, male    DOB: January 09, 1976, 44 y.o.   MRN: [id number]  Jeffery King is a 44 y.o. male presenting on 03/06/2020 for Annual Exam and Cerumen Impaction   HPI   Here for Annual Physical and Lab Review.  DJD Cervical Spine / Radiculopathy - Followed up with Dr Iline Ovenimmig ([facility]) on 10/24 [place] - he had steroid injection for neck recently some improvement, and he had one previously. Now states if not improved within 2 weeks will return and consider possible surgery in future.  Lifestyle / Wellness - Recent labs show normal range A1c and Lipids - No significant diet or lifestyle/exercise changes, currently trying to improve  Anxiety He has been managing his anxiety and overall has improved his in general. He continues on Xanax 1mg  TID - still trying to wean gradually occasional reduce dose, but currently maintains dose Reduced Buspar down to half tablet of 7.5mg  or intermittent dosing every 2-3 days weaning off gradually  GERD He has changed his eating habits with significant improvement. Now taking Omeprazole PRN with very good results. His chest tightness has improved as well. He has discontinued junk food and sodas  Asthma Currently controlled on Albuterol PRN. He is not using Flovent. Did not do well with Advair. Not on Singulair. He has done breathing exercises at home, doing well.  Vitamin D Deficiency Previous low lab. Due for repeat   Additional concern today  Cerumen Impaction Bilateral / Reduced Hearing Left ear Reports both sides with thick ear wax blocking. Reduced hearing L side request flushing. Denies ear pain or drainage   Health Maintenance:  Not interested in covid19 vaccine at this time. He had suspected covid infection >6 months ago and is not ready to pursue vaccine.  No prior colon cancer screening, not due til age 45+ medicaid  [facility] 03/06/2020 02/15/2019 08/14/2018  Decreased Interest 0 0 0    Down, Depressed, Hopeless 0 0 0  PHQ - 2 Score 0 0 0  Altered sleeping - - -  Tired, decreased energy - - -  Change in appetite - - -  Feeling bad or failure about yourself  - - -  Trouble concentrating - - -  Moving slowly or fidgety/restless - - -  Suicidal thoughts - - -  PHQ-9 Score - - -  Difficult doing work/chores - - -   GAD 7 : Generalized Anxiety Score 02/15/2019 06/01/2018 05/30/2017 11/13/2016  Nervous, Anxious, on Edge (No Data) (No Data) 0 (No Data)  Control/stop worrying - - 1 -  Worry too much - different things - - 0 -  Trouble relaxing - - 0 -  Restless - - 0 -  Easily annoyed or irritable - - 0 -  Afraid - awful might happen - - 0 -  Total GAD 7 Score - - 1 -  Anxiety Difficulty - - Not difficult at all -     Past Medical History:  Diagnosis Date  . Anxiety   . Asthma    No past surgical history on file. Social History   Socioeconomic History  . Marital status: Single    Spouse name: Not on file  . Number of children: Not on file  . Years of education: Not on file  . Highest education level: Not on file  Occupational History  . Not on file  Tobacco Use  . Smoking status: Former Smoker    Quit date:  05/30/2014    Years since quitting: 5.7  . Smokeless tobacco: Former Engineer, water and Sexual Activity  . Alcohol use: No  . Drug use: No  . Sexual activity: Not on file  Other Topics Concern  . Not on file  Social History Narrative  . Not on file   Social Determinants of Health   Financial Resource Strain:   . Difficulty of Paying Living Expenses:   Food Insecurity:   . Worried About Programme researcher, broadcasting/film/video in the Last Year:   . Barista in the Last Year:   Transportation Needs:   . Freight forwarder (Medical):   Marland Kitchen Lack of Transportation (Non-Medical):   Physical Activity:   . Days of Exercise per Week:   . Minutes of Exercise per Session:   Stress:   . Feeling of Stress :   Social Connections:   . Frequency of Communication  with Friends and Family:   . Frequency of Social Gatherings with Friends and Family:   . Attends Religious Services:   . Active Member of Clubs or Organizations:   . Attends Banker Meetings:   Marland Kitchen Marital Status:   Intimate Partner Violence:   . Fear of Current or Ex-Partner:   . Emotionally Abused:   Marland Kitchen Physically Abused:   . Sexually Abused:    Family History  Problem Relation Age of Onset  . Hypertension Mother   . Hypertension Father   . Hypertension Brother    Current Outpatient Medications on File Prior to Visit  Medication Sig  . busPIRone (BUSPAR) 7.5 MG tablet Taking half pill every 2 days, taper off   No current facility-administered medications on file prior to visit.    Review of Systems  Constitutional: Negative for activity change, appetite change, chills, diaphoresis, fatigue and fever.  HENT: Positive for hearing loss (left ear). Negative for congestion.   Eyes: Negative for visual disturbance.  Respiratory: Negative for apnea, cough, chest tightness, shortness of breath and wheezing.   Cardiovascular: Negative for chest pain, palpitations and leg swelling.  Gastrointestinal: Negative for abdominal pain, anal bleeding, blood in stool, constipation, diarrhea, nausea and vomiting.  Endocrine: Negative for cold intolerance.  Genitourinary: Negative for decreased urine volume, difficulty urinating, dysuria, frequency, hematuria, testicular pain and urgency.  Musculoskeletal: Negative for arthralgias, back pain and neck pain.  Skin: Negative for rash.  Allergic/Immunologic: Negative for environmental allergies.  Neurological: Negative for dizziness, weakness, light-headedness, numbness and headaches.  Hematological: Negative for adenopathy.  Psychiatric/Behavioral: Negative for behavioral problems, dysphoric mood and sleep disturbance. The patient is nervous/anxious.    Per HPI unless specifically indicated above      Objective:    BP (!) 136/96    Pulse 85   Temp 98 F (36.7 C) (Temporal)   Resp 16   Ht 5\' 11"  (1.803 m)   Wt 185 lb (83.9 kg)   SpO2 99%   BMI 25.80 kg/m   Wt Readings from Last 3 Encounters:  03/06/20 185 lb (83.9 kg)  08/14/18 186 lb (84.4 kg)  06/12/18 185 lb (83.9 kg)    Physical Exam Vitals and nursing note reviewed.  Constitutional:      General: He is not in acute distress.    Appearance: He is well-developed. He is not diaphoretic.     Comments: Well-appearing, comfortable, cooperative  HENT:     Head: Normocephalic and atraumatic.     Comments: Bilateral TM obscured by impacted cerumen Eyes:  General:        Right eye: No discharge.        Left eye: No discharge.     Conjunctiva/sclera: Conjunctivae normal.     Pupils: Pupils are equal, round, and reactive to light.  Neck:     Thyroid: No thyromegaly.  Cardiovascular:     Rate and Rhythm: Normal rate and regular rhythm.     Heart sounds: Normal heart sounds. No murmur.  Pulmonary:     Effort: Pulmonary effort is normal. No respiratory distress.     Breath sounds: Normal breath sounds. No wheezing or rales.  Abdominal:     General: Bowel sounds are normal. There is no distension.     Palpations: Abdomen is soft. There is no mass.     Tenderness: There is no abdominal tenderness.  Musculoskeletal:        General: No tenderness. Normal range of motion.     Cervical back: Normal range of motion and neck supple.     Comments: Upper / Lower Extremities: - Normal muscle tone, strength bilateral upper extremities 5/5, lower extremities 5/5  Lymphadenopathy:     Cervical: No cervical adenopathy.  Skin:    General: Skin is warm and dry.     Findings: No erythema or rash.  Neurological:     Mental Status: He is alert and oriented to person, place, and time.     Comments: Distal sensation intact to light touch all extremities  Psychiatric:        Behavior: Behavior normal.     Comments: Well groomed, good eye contact, normal speech and  thoughts     ________________________________________________________ PROCEDURE NOTE Date: 03/06/20 Bilateral Ear Lavage / Cerumen Removal Discussed benefits and risks (including pain / discomforts, dizziness, minor abrasion of ear canal). Verbal consent given by patient. Medication:  carbamide peroxide ear drops, Ear Lavage Solution (warm water + hydrogen peroxide) Performed by Sherron Monday CMA, Dr Althea Charon Several drops of carbamide peroxide placed in each ear, allowed to sit for few minutes. Ear lavage solution flushed into one ear at a time in attempt to dislodge and remove ear wax. Results were successful with removal of cerumen.  Repeat Ear Exam: - Completely removed cerumen now, with clear ear canals and visible TMs clear and normal appearance. Some area of slight erythema superficial external ear canal abrasion due to area of cerumen impacted on lower part of canal bilateral. No active bleeding.    Results for orders placed or performed in visit on 07/10/18  VITAMIN D 25 Hydroxy (Vit-D Deficiency, Fractures)  Result Value Ref Range   Vit D, 25-Hydroxy 18 (L) 30 - 100 ng/mL  Hemoglobin A1c  Result Value Ref Range   Hgb A1c MFr Bld 5.2 <5.7 % of total Hgb   Mean Plasma Glucose 103 (calc)   eAG (mmol/L) 5.7 (calc)  CBC with Differential/Platelet  Result Value Ref Range   WBC 5.2 3.8 - 10.8 Thousand/uL   RBC 5.17 4.20 - 5.80 Million/uL   Hemoglobin 15.8 13.2 - 17.1 g/dL   HCT 61.6 07.3 - 71.0 %   MCV 88.4 80.0 - 100.0 fL   MCH 30.6 27.0 - 33.0 pg   MCHC 34.6 32.0 - 36.0 g/dL   RDW 62.6 94.8 - 54.6 %   Platelets 195 140 - 400 Thousand/uL   MPV 11.8 7.5 - 12.5 fL   Neutro Abs 2,855 1,500 - 7,800 cells/uL   Lymphs Abs 1,430 850 - 3,900 cells/uL   WBC mixed  population 775 200 - 950 cells/uL   Eosinophils Absolute 109 15 - 500 cells/uL   Basophils Absolute 31 0 - 200 cells/uL   Neutrophils Relative % 54.9 %   Total Lymphocyte 27.5 %   Monocytes Relative 14.9 %    Eosinophils Relative 2.1 %   Basophils Relative 0.6 %  COMPLETE METABOLIC PANEL WITH GFR  Result Value Ref Range   Glucose, Bld 86 65 - 99 mg/dL   BUN 10 7 - 25 mg/dL   Creat 1.03 0.60 - 1.35 mg/dL   GFR, Est Non African American 89 > OR = 60 mL/min/1.67m2   GFR, Est African American 103 > OR = 60 mL/min/1.64m2   BUN/Creatinine Ratio NOT APPLICABLE 6 - 22 (calc)   Sodium 140 135 - 146 mmol/L   Potassium 4.5 3.5 - 5.3 mmol/L   Chloride 105 98 - 110 mmol/L   CO2 27 20 - 32 mmol/L   Calcium 9.4 8.6 - 10.3 mg/dL   Total Protein 7.3 6.1 - 8.1 g/dL   Albumin 4.1 3.6 - 5.1 g/dL   Globulin 3.2 1.9 - 3.7 g/dL (calc)   AG Ratio 1.3 1.0 - 2.5 (calc)   Total Bilirubin 0.6 0.2 - 1.2 mg/dL   Alkaline phosphatase (APISO) 86 40 - 115 U/L   AST 28 10 - 40 U/L   ALT 33 9 - 46 U/L  Lipid panel  Result Value Ref Range   Cholesterol 140 <200 mg/dL   HDL 45 >40 mg/dL   Triglycerides 96 <150 mg/dL   LDL Cholesterol (Calc) 77 mg/dL (calc)   Total CHOL/HDL Ratio 3.1 <5.0 (calc)   Non-HDL Cholesterol (Calc) 95 <130 mg/dL (calc)      Assessment & Plan:   Problem List Items Addressed This Visit    Vitamin D insufficiency   Relevant Orders   VITAMIN D 25 Hydroxy (Vit-D Deficiency, Fractures)   Mild persistent asthma    Stable without flare Chronic asthma, mild intermittent vs mild persistent, without acute exac. - Trigger with environmental allergies - Off Advair, singulair  Plan: 1. Monitor asthma 2. Use Albuterol PRN 3. Continue Loratadine for allergies      Relevant Medications   PROAIR HFA 108 (90 Base) MCG/ACT inhaler   Gastroesophageal reflux disease without esophagitis    Improved GERD overall with diet changes Seems dramatically improved chest discomfort as well - No GI red flag symptoms - Chronic history of GERD  Plan: 1. Continue Omeprazole 40mg , has reduced some use 2. Diet modifications reduce GERD 3. Future follow-up consider GI if not improving      Relevant  Medications   omeprazole (PRILOSEC) 40 MG capsule   Other Relevant Orders   CBC with Differential/Platelet   COMPLETE METABOLIC PANEL WITH GFR   Environmental and seasonal allergies   Relevant Medications   loratadine (ALLERGY RELIEF) 10 MG tablet   Degenerative disc disease, cervical   Relevant Medications   baclofen (LIORESAL) 10 MG tablet   Anxiety    Controlled Stable, well controlled anxiety with panic, now without panic attacks - H/o >25 years, on xanax stable >7 years - Prior failed SSRI unsure - Checked PDMP today for 2 year, appropriate filling of xanax only  Plan: 1. Re order Alprazolam 1mg  TID - #90, +5 refills - Continue Buspar 7.5mg  low dose half tab or whole tab taper down every 3+ days       Relevant Medications   busPIRone (BUSPAR) 7.5 MG tablet   ALPRAZolam (XANAX) 1 MG tablet  Other Relevant Orders   COMPLETE METABOLIC PANEL WITH GFR    Other Visit Diagnoses    Annual physical exam    -  Primary   Relevant Orders   Hemoglobin A1c   CBC with Differential/Platelet   COMPLETE METABOLIC PANEL WITH GFR   Lipid panel   PSA   TSH   Bilateral impacted cerumen       Hearing loss due to cerumen impaction, left       Screening for prostate cancer       Relevant Orders   PSA   Abnormal glucose       Relevant Orders   Hemoglobin A1c      Updated Health Maintenance information - Due for initial prostate CA screening at age 41 almost 61, and discussed colon cancer screening upcoming age 59+ however on medicaid coverage of screening tests will begin at age 2+ Reviewed recent lab results with patient Encouraged improvement to lifestyle with diet and exercise Goal maintain weight  #Bilateral cerumen impaction / Hearing loss L side  Significant amount of large thick impacted cerumen bilaterally, suspected primary cause of current reduced bilateral hearing left side  Plan: 1. Successful office ear lavage cerumen removal today, re-evaluated with clear ear  canals and normal TMs 2. Counseled on avoiding Q-tips and may use Kleenex as wick, use OTC Debrox as needed 3. Follow-up as needed   Meds ordered this encounter  Medications  . loratadine (ALLERGY RELIEF) 10 MG tablet    Sig: Take 1 tablet (10 mg total) by mouth daily.    Dispense:  90 tablet    Refill:  3  . omeprazole (PRILOSEC) 40 MG capsule    Sig: Take 1 capsule (40 mg total) by mouth daily before breakfast.    Dispense:  90 capsule    Refill:  3  . baclofen (LIORESAL) 10 MG tablet    Sig: Take 0.5-1 tablets (5-10 mg total) by mouth 3 (three) times daily as needed for muscle spasms.    Dispense:  90 each    Refill:  1    Keep on file if patient not ready to pick up  . DISCONTD: ALPRAZolam (XANAX) 1 MG tablet    Sig: Take 1 tablet (1 mg total) by mouth 3 (three) times daily.    Dispense:  90 tablet    Refill:  2  . PROAIR HFA 108 (90 Base) MCG/ACT inhaler    Sig: INHALE 2 PUFFS INTO THE LUNGS EVERY 4 HOURS AS NEEDED FOR SHORTNESS OF BREATH OR WHEEZING    Dispense:  8.5 g    Refill:  5  . ALPRAZolam (XANAX) 1 MG tablet    Sig: Take 1 tablet (1 mg total) by mouth 3 (three) times daily.    Dispense:  90 tablet    Refill:  5    Added refills to keep on file. thanks      Follow up plan: Return in about 6 months (around 09/06/2020) for 6 month med refill Anxiety / GERD.  [name], DO [facility] 03/06/2020, 9:49 AM

## 2020-03-06 NOTE — Assessment & Plan Note (Signed)
Stable without flare Chronic asthma, mild intermittent vs mild persistent, without acute exac. - Trigger with environmental allergies - Off Advair, singulair  Plan: 1. Monitor asthma 2. Use Albuterol PRN 3. Continue Loratadine for allergies

## 2020-03-06 NOTE — Assessment & Plan Note (Signed)
Improved GERD overall with diet changes Seems dramatically improved chest discomfort as well - No GI red flag symptoms - Chronic history of GERD  Plan: 1. Continue Omeprazole 40mg , has reduced some use 2. Diet modifications reduce GERD 3. Future follow-up consider GI if not improving

## 2020-03-06 NOTE — Assessment & Plan Note (Signed)
Controlled Stable, well controlled anxiety with panic, now without panic attacks - H/o >25 years, on xanax stable >7 years - Prior failed SSRI unsure - Checked PDMP today for 2 year, appropriate filling of xanax only  Plan: 1. Re order Alprazolam 1mg TID - #90, +5 refills - Continue Buspar 7.5mg low dose half tab or whole tab taper down every 3+ days  

## 2020-03-06 NOTE — Patient Instructions (Addendum)
Thank you for coming to the office today.  Labs ordered today  Meds refilled today.  Ear wax clean today  Please schedule a Follow-up Appointment to: Return in about 6 months (around 09/06/2020) for 6 month med refill Anxiety / GERD.  If you have any other questions or concerns, please feel free to call the office or send a message through MyChart. You may also schedule an earlier appointment if necessary.  Additionally, you may be receiving a survey about your experience at our office within a few days to 1 week by e-mail or mail. We value your feedback.  [name], DO [facility], [place]

## 2020-03-07 LAB — CBC WITH DIFFERENTIAL/PLATELET
Absolute Monocytes: 629 cells/uL (ref 200–950)
Basophils Absolute: 29 cells/uL (ref 0–200)
Basophils Relative: 0.6 %
Eosinophils Absolute: 0 cells/uL — ABNORMAL LOW (ref 15–500)
Eosinophils Relative: 0 %
HCT: 46.6 % (ref 38.5–50.0)
Hemoglobin: 15.7 g/dL (ref 13.2–17.1)
Lymphs Abs: 1858 cells/uL (ref 850–3900)
MCH: 30.1 pg (ref 27.0–33.0)
MCHC: 33.7 g/dL (ref 32.0–36.0)
MCV: 89.3 fL (ref 80.0–100.0)
MPV: 11.6 fL (ref 7.5–12.5)
Monocytes Relative: 13.1 %
Neutro Abs: 2285 cells/uL (ref 1500–7800)
Neutrophils Relative %: 47.6 %
Platelets: 180 10*3/uL (ref 140–400)
RBC: 5.22 10*6/uL (ref 4.20–5.80)
RDW: 12.3 % (ref 11.0–15.0)
Total Lymphocyte: 38.7 %
WBC: 4.8 10*3/uL (ref 3.8–10.8)

## 2020-03-07 LAB — COMPLETE METABOLIC PANEL WITH GFR
AG Ratio: 1.3 (calc) (ref 1.0–2.5)
ALT: 32 U/L (ref 9–46)
AST: 25 U/L (ref 10–40)
Albumin: 4.1 g/dL (ref 3.6–5.1)
Alkaline phosphatase (APISO): 98 U/L (ref 36–130)
BUN: 11 mg/dL (ref 7–25)
CO2: 28 mmol/L (ref 20–32)
Calcium: 9.5 mg/dL (ref 8.6–10.3)
Chloride: 104 mmol/L (ref 98–110)
Creat: 1.1 mg/dL (ref 0.60–1.35)
GFR, Est African American: 94 mL/min/{1.73_m2} (ref >=60)
GFR, Est Non African American: 81 mL/min/{1.73_m2} (ref >=60)
Globulin: 3.1 g/dL (calc) (ref 1.9–3.7)
Glucose, Bld: 83 mg/dL (ref 65–99)
Potassium: 4.6 mmol/L (ref 3.5–5.3)
Sodium: 141 mmol/L (ref 135–146)
Total Bilirubin: 0.6 mg/dL (ref 0.2–1.2)
Total Protein: 7.2 g/dL (ref 6.1–8.1)

## 2020-03-07 LAB — LIPID PANEL
Cholesterol: 141 mg/dL (ref <200)
HDL: 45 mg/dL (ref >=40)
LDL Cholesterol (Calc): 76 mg/dL (calc)
Non-HDL Cholesterol (Calc): 96 mg/dL (calc) (ref <130)
Total CHOL/HDL Ratio: 3.1 (calc) (ref <5.0)
Triglycerides: 117 mg/dL (ref <150)

## 2020-03-07 LAB — HEMOGLOBIN A1C
Hgb A1c MFr Bld: 5.1 % of total Hgb (ref <5.7)
Mean Plasma Glucose: 100 (calc)
eAG (mmol/L): 5.5 (calc)

## 2020-03-07 LAB — TSH: TSH: 4.48 mIU/L (ref 0.40–4.50)

## 2020-03-07 LAB — VITAMIN D 25 HYDROXY (VIT D DEFICIENCY, FRACTURES): Vit D, 25-Hydroxy: 21 ng/mL — ABNORMAL LOW (ref 30–100)

## 2020-03-07 LAB — PSA: PSA: 0.8 ng/mL (ref <=4.0)

## 2020-09-08 ENCOUNTER — Other Ambulatory Visit: Payer: Self-pay

## 2020-09-08 ENCOUNTER — Ambulatory Visit (INDEPENDENT_AMBULATORY_CARE_PROVIDER_SITE_OTHER): Payer: Medicaid Other | Admitting: Family Medicine

## 2020-09-08 ENCOUNTER — Encounter: Payer: Self-pay | Admitting: Family Medicine

## 2020-09-08 VITALS — BP 136/97 | HR 128 | Temp 97.5°F | Resp 16 | Ht 71.0 in | Wt 192.6 lb

## 2020-09-08 DIAGNOSIS — F419 Anxiety disorder, unspecified: Secondary | ICD-10-CM | POA: Diagnosis not present

## 2020-09-08 DIAGNOSIS — B360 Pityriasis versicolor: Secondary | ICD-10-CM

## 2020-09-08 DIAGNOSIS — B351 Tinea unguium: Secondary | ICD-10-CM | POA: Diagnosis not present

## 2020-09-08 DIAGNOSIS — L84 Corns and callosities: Secondary | ICD-10-CM | POA: Diagnosis not present

## 2020-09-08 MED ORDER — ALPRAZOLAM 1 MG PO TABS: 1.0000 mg | ORAL_TABLET | Freq: Three times a day (TID) | ORAL | 5 refills | Status: DC

## 2020-09-08 MED ORDER — TERBINAFINE HCL 250 MG PO TABS: 250.0000 mg | ORAL_TABLET | Freq: Every day | ORAL | 2 refills | Status: DC

## 2020-09-08 MED ORDER — FLUCONAZOLE 150 MG PO TABS: 300.0000 mg | ORAL_TABLET | ORAL | 1 refills | Status: DC

## 2020-09-08 NOTE — Patient Instructions (Addendum)
Thank you for coming to the office today.  Take Fluconazole for 2 weeks as prescribed if want to start now, then can do refill next year.  Then take Terbinafine for toenails when ready for up to 90 day or 3 month  Don't take both at same time.  Refilled ALprazolam.  DUE for FASTING BLOOD WORK (no food or drink after midnight before the lab appointment, only water or coffee without cream/sugar on the morning of)  SCHEDULE "Lab Only" visit in the morning at the clinic for lab draw in 6 MONTHS   - Make sure Lab Only appointment is at about 1 week before your next appointment, so that results will be available  For Lab Results, once available within 2-3 days of blood draw, you can can log in to MyChart online to view your results and a brief explanation. Also, we can discuss results at next follow-up visit.    Please schedule a Follow-up Appointment to: Return in about 6 months (around 03/08/2021) for 6 month fasting lab only then 1 week later Annual Physical.  If you have any other questions or concerns, please feel free to call the office or send a message through MyChart. You may also schedule an earlier appointment if necessary.  Additionally, you may be receiving a survey about your experience at our office within a few days to 1 week by e-mail or mail. We value your feedback.  [name], DO [facility], [place]

## 2020-09-08 NOTE — Progress Notes (Addendum)
Subjective:    Patient ID: Jeffery King, male    DOB: [birth year], 44 y.o.   MRN: [id number]  Jeffery King is a 44 y.o. male presenting on 09/08/2020 for Numbness (Toe onset week----6 month follow up )   HPI   Toe Numbness, callus formation Onychocmycosis Reported 6 weeks ago, identified numbness across tip of toes all 5 toes both sides, unsure cause, has improved now it is only middle toes.  Tinea Versicolor rash  DJD Cervical Spine / Radiculopathy - Followed up with Dr Iline Oven ([facility]) on 10/24 [place] - he had steroid injection for neckrecently some improvement, and he had one previously. Now states if not improved within 2 weeks will return and consider possible surgery in future.  Anxiety He has been managing his anxiety and overall has improved his in general. He continues onXanax 1mg  TID- still trying to wean gradually occasional reduce dose, but currently maintains dose Reduced Buspar down to half tablet of 7.5mg  or intermittent dosing every 2-3 days weaning off gradually  GERD He has changed his eating habits with significant improvement. Now taking Omeprazole PRN with very good results. His chest tightness has improved as well. He has discontinued junk food and sodas    Health Maintenance:  Not interested in covid19 vaccine at this time. He had suspected covid infection >6 months ago and is not ready to pursue vaccine.  No prior colon cancer screening, not due til age 25+ medicaid   Depression screen [facility] 2/9 03/06/2020 02/15/2019 08/14/2018  Decreased Interest 0 0 0  Down, Depressed, Hopeless 0 0 0  PHQ - 2 Score 0 0 0  Altered sleeping - - -  Tired, decreased energy - - -  Change in appetite - - -  Feeling bad or failure about yourself  - - -  Trouble concentrating - - -  Moving slowly or fidgety/restless - - -  Suicidal thoughts - - -  PHQ-9 Score - - -  Difficult doing work/chores - - -    Social History   Tobacco Use  . Smoking status:  Former Smoker    Quit date: 05/30/2014    Years since quitting: 6.2  . Smokeless tobacco: Former 07/30/2014 Use Topics  . Alcohol use: No  . Drug use: No    Review of Systems Per HPI unless specifically indicated above     Objective:    BP (!) 136/97   Pulse (!) 128   Temp (!) 97.5 F (36.4 C) (Temporal)   Resp 16   Ht 5\' 11"  (1.803 m)   Wt 192 lb 9.6 oz (87.4 kg)   SpO2 99%   BMI 26.86 kg/m   Wt Readings from Last 3 Encounters:  09/08/20 192 lb 9.6 oz (87.4 kg)  03/06/20 185 lb (83.9 kg)  08/14/18 186 lb (84.4 kg)    Physical Exam Vitals and nursing note reviewed.  Constitutional:      General: He is not in acute distress.    Appearance: He is well-developed. He is not diaphoretic.     Comments: Well-appearing, comfortable, cooperative  HENT:     Head: Normocephalic and atraumatic.  Eyes:     General:        Right eye: No discharge.        Left eye: No discharge.     Conjunctiva/sclera: Conjunctivae normal.  Cardiovascular:     Rate and Rhythm: Normal rate.  Pulmonary:     Effort: Pulmonary effort is normal.  Skin:    General: Skin is warm and dry.     Findings: No erythema or rash.  Neurological:     Mental Status: He is alert and oriented to person, place, and time.  Psychiatric:        Behavior: Behavior normal.     Comments: Well groomed, good eye contact, normal speech and thoughts       Results for orders placed or performed in visit on 03/06/20  Hemoglobin A1c  Result Value Ref Range   Hgb A1c MFr Bld 5.1 <5.7 % of total Hgb   Mean Plasma Glucose 100 (calc)   eAG (mmol/L) 5.5 (calc)  CBC with Differential/Platelet  Result Value Ref Range   WBC 4.8 3.8 - 10.8 Thousand/uL   RBC 5.22 4.20 - 5.80 Million/uL   Hemoglobin 15.7 13.2 - 17.1 g/dL   HCT 16.1 38 - 50 %   MCV 89.3 80.0 - 100.0 fL   MCH 30.1 27.0 - 33.0 pg   MCHC 33.7 32.0 - 36.0 g/dL   RDW 09.6 04.5 - 40.9 %   Platelets 180 140 - 400 Thousand/uL   MPV 11.6 7.5 - 12.5 fL    Neutro Abs 2,285 1,500 - 7,800 cells/uL   Lymphs Abs 1,858 850 - 3,900 cells/uL   Absolute Monocytes 629 200 - 950 cells/uL   Eosinophils Absolute 0 (L) 15.0 - 500.0 cells/uL   Basophils Absolute 29 0.0 - 200.0 cells/uL   Neutrophils Relative % 47.6 %   Total Lymphocyte 38.7 %   Monocytes Relative 13.1 %   Eosinophils Relative 0.0 %   Basophils Relative 0.6 %  COMPLETE METABOLIC PANEL WITH GFR  Result Value Ref Range   Glucose, Bld 83 65 - 99 mg/dL   BUN 11 7 - 25 mg/dL   Creat 8.11 9.14 - 7.82 mg/dL   GFR, Est Non African American 81 > OR = 60 mL/min/1.91m2   GFR, Est African American 94 > OR = 60 mL/min/1.49m2   BUN/Creatinine Ratio NOT APPLICABLE 6 - 22 (calc)   Sodium 141 135 - 146 mmol/L   Potassium 4.6 3.5 - 5.3 mmol/L   Chloride 104 98 - 110 mmol/L   CO2 28 20 - 32 mmol/L   Calcium 9.5 8.6 - 10.3 mg/dL   Total Protein 7.2 6.1 - 8.1 g/dL   Albumin 4.1 3.6 - 5.1 g/dL   Globulin 3.1 1.9 - 3.7 g/dL (calc)   AG Ratio 1.3 1.0 - 2.5 (calc)   Total Bilirubin 0.6 0.2 - 1.2 mg/dL   Alkaline phosphatase (APISO) 98 36 - 130 U/L   AST 25 10 - 40 U/L   ALT 32 9 - 46 U/L  Lipid panel  Result Value Ref Range   Cholesterol 141 <200 mg/dL   HDL 45 > OR = 40 mg/dL   Triglycerides 956 <213 mg/dL   LDL Cholesterol (Calc) 76 mg/dL (calc)   Total CHOL/HDL Ratio 3.1 <5.0 (calc)   Non-HDL Cholesterol (Calc) 96 <086 mg/dL (calc)  PSA  Result Value Ref Range   PSA 0.8 < OR = 4.0 ng/mL  VITAMIN D 25 Hydroxy (Vit-D Deficiency, Fractures)  Result Value Ref Range   Vit D, 25-Hydroxy 21 (L) 30 - 100 ng/mL  TSH  Result Value Ref Range   TSH 4.48 0.40 - 4.50 mIU/L      Assessment & Plan:   Problem List Items Addressed This Visit    Anxiety    Controlled Stable, well controlled anxiety with panic, now  without panic attacks - H/o >25 years, on xanax stable >7 years - Prior failed SSRI unsure - Checked PDMP today for 2 year, appropriate filling of xanax only  Plan: 1. Re order  Alprazolam 1mg  TID - #90, +5 refills - Continue Buspar 7.5mg  low dose half tab or whole tab taper down every 3+ days       Relevant Medications   ALPRAZolam (XANAX) 1 MG tablet    Other Visit Diagnoses    Onychomycosis of toenail    -  Primary   Relevant Medications   fluconazole (DIFLUCAN) 150 MG tablet   terbinafine (LAMISIL) 250 MG tablet   Callus of foot       Tinea versicolor       Relevant Medications   fluconazole (DIFLUCAN) 150 MG tablet      #Tinea versicolor Clinically with generalized hypopigmented rash, in past consistent with tinea versicolor treated with diflucan, will re order course today, may use now or when flared  #Onychomycosis Trial of Terbinafine 250mg  daily for 1-3 months pending course, localized great toe now  #Toe numbness - seems localized area tip of toes with callus formation, no sign of neuropathy on exam  Meds ordered this encounter  Medications  . fluconazole (DIFLUCAN) 150 MG tablet    Sig: Take 2 tablets (300 mg total) by mouth once a week. For 2 weeks. May repeat in future. Don't take at same time as Terbinafine.    Dispense:  4 tablet    Refill:  1  . terbinafine (LAMISIL) 250 MG tablet    Sig: Take 1 tablet (250 mg total) by mouth daily. For up to 3 months total if complete all refills. Don't take at same time as Fluconazole.    Dispense:  30 tablet    Refill:  2  . ALPRAZolam (XANAX) 1 MG tablet    Sig: Take 1 tablet (1 mg total) by mouth 3 (three) times daily.    Dispense:  90 tablet    Refill:  5      Follow up plan: Return in about 6 months (around 03/08/2021) for 6 month fasting lab only then 1 week later Annual Physical.  Future labs ordered for 03/15/21   03/10/2021, DO [facility] 09/08/2020, 9:22 AM

## 2020-09-10 ENCOUNTER — Other Ambulatory Visit: Payer: Self-pay | Admitting: Family Medicine

## 2020-09-10 DIAGNOSIS — Z0000 Encounter for general adult medical examination without abnormal findings: Secondary | ICD-10-CM

## 2020-09-10 DIAGNOSIS — F419 Anxiety disorder, unspecified: Secondary | ICD-10-CM

## 2020-09-10 DIAGNOSIS — M5030 Other cervical disc degeneration, unspecified cervical region: Secondary | ICD-10-CM

## 2020-09-10 DIAGNOSIS — Z125 Encounter for screening for malignant neoplasm of prostate: Secondary | ICD-10-CM

## 2020-09-10 DIAGNOSIS — R7309 Other abnormal glucose: Secondary | ICD-10-CM

## 2020-09-10 DIAGNOSIS — E559 Vitamin D deficiency, unspecified: Secondary | ICD-10-CM

## 2020-09-10 NOTE — Assessment & Plan Note (Signed)
Controlled Stable, well controlled anxiety with panic, now without panic attacks - H/o >25 years, on xanax stable >7 years - Prior failed SSRI unsure - Checked PDMP today for 2 year, appropriate filling of xanax only  Plan: 1. Re order Alprazolam 1mg  TID - #90, +5 refills - Continue Buspar 7.5mg  low dose half tab or whole tab taper down every 3+ days

## 2020-10-20 ENCOUNTER — Other Ambulatory Visit: Payer: Self-pay | Admitting: Family Medicine

## 2020-10-20 DIAGNOSIS — J4530 Mild persistent asthma, uncomplicated: Secondary | ICD-10-CM

## 2021-03-14 ENCOUNTER — Other Ambulatory Visit: Payer: Self-pay | Admitting: Family Medicine

## 2021-03-14 DIAGNOSIS — F419 Anxiety disorder, unspecified: Secondary | ICD-10-CM

## 2021-03-15 ENCOUNTER — Other Ambulatory Visit: Payer: Self-pay

## 2021-03-15 ENCOUNTER — Telehealth (INDEPENDENT_AMBULATORY_CARE_PROVIDER_SITE_OTHER): Payer: Medicaid Other | Admitting: Internal Medicine

## 2021-03-15 ENCOUNTER — Encounter: Payer: Self-pay | Admitting: Internal Medicine

## 2021-03-15 ENCOUNTER — Other Ambulatory Visit: Payer: Self-pay | Admitting: Internal Medicine

## 2021-03-15 DIAGNOSIS — J4521 Mild intermittent asthma with (acute) exacerbation: Secondary | ICD-10-CM | POA: Diagnosis not present

## 2021-03-15 DIAGNOSIS — F419 Anxiety disorder, unspecified: Secondary | ICD-10-CM

## 2021-03-15 MED ORDER — PREDNISONE 10 MG PO TABS: ORAL_TABLET | ORAL | 0 refills | Status: DC

## 2021-03-15 NOTE — Patient Instructions (Signed)
http://www.aaaai.org/conditions-and-treatments/asthma">  Asthma, Adult  Asthma is a long-term (chronic) condition that causes recurrent episodes in which the airways become tight and narrow. The airways are the passages that lead from the nose and mouth down into the lungs. Asthma episodes, also called asthma attacks, can cause coughing, wheezing, shortness of breath, and chest pain. The airways can also fill with mucus. During an attack, it can be difficult to breathe. Asthma attacks can range from minor to life threatening. Asthma cannot be cured, but medicines and lifestyle changes can help control it and treat acute attacks. What are the causes? This condition is believed to be caused by inherited (genetic) and environmental factors, but its exact cause is not known. There are many things that can bring on an asthma attack or make asthma symptoms worse (triggers). Asthma triggers are different for each person. Common triggers include:  Mold.  Dust.  Cigarette smoke.  Cockroaches.  Things that can cause allergy symptoms (allergens), such as animal dander or pollen from trees or grass.  Air pollutants such as household cleaners, wood smoke, smog, or chemical odors.  Cold air, weather changes, and winds (which increase molds and pollen in the air).  Strong emotional expressions such as crying or laughing hard.  Stress.  Certain medicines (such as aspirin) or types of medicines (such as beta-blockers).  Sulfites in foods and drinks. Foods and drinks that may contain sulfites include dried fruit, potato chips, and sparkling grape juice.  Infections or inflammatory conditions such as the flu, a cold, or inflammation of the nasal membranes (rhinitis).  Gastroesophageal reflux disease (GERD).  Exercise or strenuous activity. What are the signs or symptoms? Symptoms of this condition may occur right after asthma is triggered or many hours later. Symptoms include:  Wheezing. This can  sound like whistling when you breathe.  Excessive nighttime or early morning coughing.  Frequent or severe coughing with a common cold.  Chest tightness.  Shortness of breath.  Tiredness (fatigue) with minimal activity. How is this diagnosed? This condition is diagnosed based on:  Your medical history.  A physical exam.  Tests, which may include: ? Lung function studies and pulmonary studies (spirometry). These tests can evaluate the flow of air in your lungs. ? Allergy tests. ? Imaging tests, such as X-rays. How is this treated? There is no cure for this condition, but treatment can help control your symptoms. Treatment for asthma usually involves:  Identifying and avoiding your asthma triggers.  Using medicines to control your symptoms. Generally, two types of medicines are used to treat asthma: ? Controller medicines. These help prevent asthma symptoms from occurring. They are usually taken every day. ? Fast-acting reliever or rescue medicines. These quickly relieve asthma symptoms by widening the narrow and tight airways. They are used as needed and provide short-term relief.  Using supplemental oxygen. This may be needed during a severe episode.  Using other medicines, such as: ? Allergy medicines, such as antihistamines, if your asthma attacks are triggered by allergens. ? Immune medicines (immunomodulators). These are medicines that help control the immune system.  Creating an asthma action plan. An asthma action plan is a written plan for managing and treating your asthma attacks. This plan includes: ? A list of your asthma triggers and how to avoid them. ? Information about when medicines should be taken and when their dosage should be changed. ? Instructions about using a device called a peak flow meter. A peak flow meter measures how well the lungs are working   and the severity of your asthma. It helps you monitor your condition. Follow these instructions at  home: Controlling your home environment Control your home environment in the following ways to help avoid triggers and prevent asthma attacks:  Change your heating and air conditioning filter regularly.  Limit your use of fireplaces and wood stoves.  Get rid of pests (such as roaches and mice) and their droppings.  Throw away plants if you see mold on them.  Clean floors and dust surfaces regularly. Use unscented cleaning products.  Try to have someone else vacuum for you regularly. Stay out of rooms while they are being vacuumed and for a short while afterward. If you vacuum, use a dust mask from a hardware store, a double-layered or microfilter vacuum cleaner bag, or a vacuum cleaner with a HEPA filter.  Replace carpet with wood, tile, or vinyl flooring. Carpet can trap dander and dust.  Use allergy-proof pillows, mattress covers, and box spring covers.  Keep your bedroom a trigger-free room.  Avoid pets and keep windows closed when allergens are in the air.  Wash beddings every week in hot water and dry them in a dryer.  Use blankets that are made of polyester or cotton.  Clean bathrooms and kitchens with bleach. If possible, have someone repaint the walls in these rooms with mold-resistant paint. Stay out of the rooms that are being cleaned and painted.  Wash your hands often with soap and water. If soap and water are not available, use hand sanitizer.  Do not allow anyone to smoke in your home. General instructions  Take over-the-counter and prescription medicines only as told by your health care provider. ? Speak with your health care provider if you have questions about how or when to take the medicines. ? Make note if you are requiring more frequent dosages.  Do not use any products that contain nicotine or tobacco, such as cigarettes and e-cigarettes. If you need help quitting, ask your health care provider. Also, avoid being exposed to secondhand smoke.  Use a peak  flow meter as told by your health care provider. Record and keep track of the readings.  Understand and use the asthma action plan to help minimize, or stop an asthma attack, without needing to seek medical care.  Make sure you stay up to date on your yearly vaccinations as told by your health care provider. This may include vaccines for the flu and pneumonia.  Avoid outdoor activities when allergen counts are high and when air quality is low.  Wear a ski mask that covers your nose and mouth during outdoor winter activities. Exercise indoors on cold days if you can.  Warm up before exercising, and take time for a cool-down period after exercise.  Keep all follow-up visits as told by your health care provider. This is important. Where to find more information  For information about asthma, turn to the Centers for Disease Control and Prevention at www.cdc.gov/asthma/faqs  For air quality information, turn to AirNow at airnow.gov Contact a health care provider if:  You have wheezing, shortness of breath, or a cough even while you are taking medicine to prevent attacks.  The mucus you cough up (sputum) is thicker than usual.  Your sputum changes from clear or white to yellow, green, gray, or bloody.  Your medicines are causing side effects, such as a rash, itching, swelling, or trouble breathing.  You need to use a reliever medicine more than 2-3 times a week.  Your peak   flow reading is still at 50-79% of your personal best after following your action plan for 1 hour.  You have a fever. Get help right away if:  You are getting worse and do not respond to treatment during an asthma attack.  You are short of breath when at rest or when doing very little physical activity.  You have difficulty eating, drinking, or talking.  You have chest pain or tightness.  You develop a fast heartbeat or palpitations.  You have a bluish color to your lips or fingernails.  You are  light-headed or dizzy, or you faint.  Your peak flow reading is less than 50% of your personal best.  You feel too tired to breathe normally. Summary  Asthma is a long-term (chronic) condition that causes recurrent episodes in which the airways become tight and narrow. These episodes can cause coughing, wheezing, shortness of breath, and chest pain.  Asthma cannot be cured, but medicines and lifestyle changes can help control it and treat acute attacks.  Make sure you understand how to avoid triggers and how and when to use your medicines.  Asthma attacks can range from minor to life threatening. Get help right away if you have an asthma attack and do not respond to treatment with your usual rescue medicines. This information is not intended to replace advice given to you by your health care provider. Make sure you discuss any questions you have with your health care provider. Document Revised: 07/07/2020 Document Reviewed: 02/09/2020 Elsevier Patient Education  2021 [employer] Inc.  

## 2021-03-15 NOTE — Telephone Encounter (Signed)
Requested medication (s) are due for refill today: yes  Requested medication (s) are on the active medication list: yes  Last refill:  03/14/21  Future visit scheduled: yes  Notes to clinic:  not delegated    Requested Prescriptions  Pending Prescriptions Disp Refills   ALPRAZolam (XANAX) 1 MG tablet [Pharmacy Med Name: ALPRAZOLAM 1 MG TAB] 90 tablet     Sig: TAKE ONE TABLET (1 MG) BY MOUTH 3 TIMES DAILY      Not Delegated - Psychiatry:  Anxiolytics/Hypnotics Failed - 03/15/2021  8:16 AM      Failed - This refill cannot be delegated      Failed - Urine Drug Screen completed in last 360 days      Failed - Valid encounter within last 6 months    Recent Outpatient Visits           6 months ago Onychomycosis of toenail   [facility], Netta Neat, DO   1 year ago Annual physical exam   [facility] Smitty Cords, DO   1 year ago Blepharitis of right upper eyelid, unspecified type   [facility], Netta Neat, DO   2 years ago Anxiety   [facility] Althea Charon, Netta Neat, DO   2 years ago Annual physical exam   [facility], Netta Neat, DO       Future Appointments             Today Sampson Si, Salvadore Oxford, NP [facility]   In 5 days Althea Charon, Netta Neat, DO [facility]   In 1 week Althea Charon, Netta Neat, DO [facility]

## 2021-03-15 NOTE — Progress Notes (Signed)
Virtual Visit via Video Note  I connected with Jeffery King on 03/15/21 at  4:00 PM EDT by a video enabled telemedicine application and verified that I am speaking with the correct person using two identifiers.  Location: Patient: Home Provider: Office  Persons participating in this video call: Nicki Reaper, NP and Leandra Kern   I discussed the limitations of evaluation and management by telemedicine and the availability of in person appointments. The patient expressed understanding and agreed to proceed.  History of Present Illness:  Pt reports fever, sore throat, cough and chest congestion. He reports this started 5 days ago. He denies difficulty swallowing. The cough is mostly nonproductive. He reports his fever and sore throat has improved. He has a lingering cough. He denies headache, runny nose, nasal congestion, ear pain, or SOB.  He does have a history of asthma. He did have flu exposure 5 days ago. He is taking Loratadine and Albuterol as prescribed.    Past Medical History:  Diagnosis Date  . Anxiety   . Asthma     Current Outpatient Medications  Medication Sig Dispense Refill  . ALPRAZolam (XANAX) 1 MG tablet TAKE ONE TABLET BY MOUTH 3 TIMES DAILY 90 tablet 0  . baclofen (LIORESAL) 10 MG tablet Take 0.5-1 tablets (5-10 mg total) by mouth 3 (three) times daily as needed for muscle spasms. 90 each 1  . busPIRone (BUSPAR) 7.5 MG tablet Taking half pill every 2 days, taper off 45 tablet 2  . fluconazole (DIFLUCAN) 150 MG tablet Take 2 tablets (300 mg total) by mouth once a week. For 2 weeks. May repeat in future. Don't take at same time as Terbinafine. 4 tablet 1  . loratadine (ALLERGY RELIEF) 10 MG tablet Take 1 tablet (10 mg total) by mouth daily. 90 tablet 3  . omeprazole (PRILOSEC) 40 MG capsule Take 1 capsule (40 mg total) by mouth daily before breakfast. 90 capsule 3  . PROAIR HFA 108 (90 Base) MCG/ACT inhaler INHALE 2 PUFFS INTO THE LUNGS EVERY 4 HOURS AS NEEDED FOR  SHORTNESS OF BREATH OR WHEEZING 8.5 g 5  . terbinafine (LAMISIL) 250 MG tablet Take 1 tablet (250 mg total) by mouth daily. For up to 3 months total if complete all refills. Don't take at same time as Fluconazole. 30 tablet 2   No current facility-administered medications for this visit.    Allergies  Allergen Reactions  . Aspirin Shortness Of Breath and Swelling  . Gabapentin Shortness Of Breath  . Wellbutrin [Bupropion] Shortness Of Breath  . Ibuprofen Swelling  . Meloxicam Other (See Comments)  . Naproxen Swelling    Lips    Family History  Problem Relation Age of Onset  . Hypertension Mother   . Hypertension Father   . Hypertension Brother     Social History   Socioeconomic History  . Marital status: Single    Spouse name: Not on file  . Number of children: Not on file  . Years of education: Not on file  . Highest education level: Not on file  Occupational History  . Not on file  Tobacco Use  . Smoking status: Former Smoker    Quit date: 05/30/2014    Years since quitting: 6.7  . Smokeless tobacco: Former Engineer, water and Sexual Activity  . Alcohol use: No  . Drug use: No  . Sexual activity: Not on file  Other Topics Concern  . Not on file  Social History Narrative  . Not on file  Social Determinants of Health   Financial Resource Strain: Not on file  Food Insecurity: Not on file  Transportation Needs: Not on file  Physical Activity: Not on file  Stress: Not on file  Social Connections: Not on file  Intimate Partner Violence: Not on file     Constitutional: Denies fever, malaise, fatigue, headache or abrupt weight changes.  HEENT: Denies eye pain, eye redness, ear pain, ringing in the ears, wax buildup, runny nose, nasal congestion, bloody nose, or sore throat. Respiratory: Patient reports cough and chest congestion.  Denies difficulty breathing, shortness of breath, or sputum production.   Gastrointestinal: Denies abdominal pain, bloating,  constipation, diarrhea or blood in the stool.   No other specific complaints in a complete review of systems (except as listed in HPI above).  Observations/Objective:  Wt Readings from Last 3 Encounters:  09/08/20 192 lb 9.6 oz (87.4 kg)  03/06/20 185 lb (83.9 kg)  08/14/18 186 lb (84.4 kg)    General: Appears his stated age, well developed, well nourished in NAD. HEENT: Head: normal shape and size; Nose: no congestion noted ; Throat/Mouth: no hoarseness noted Pulmonary/Chest: Normal effort. No respiratory distress.  Neurological: Alert and oriented.   BMET    Component Value Date/Time   NA 141 03/06/2020 1042   K 4.6 03/06/2020 1042   CL 104 03/06/2020 1042   CO2 28 03/06/2020 1042   GLUCOSE 83 03/06/2020 1042   BUN 11 03/06/2020 1042   CREATININE 1.10 03/06/2020 1042   CALCIUM 9.5 03/06/2020 1042   GFRNONAA 81 03/06/2020 1042   GFRAA 94 03/06/2020 1042    Lipid Panel     Component Value Date/Time   CHOL 141 03/06/2020 1042   TRIG 117 03/06/2020 1042   HDL 45 03/06/2020 1042   CHOLHDL 3.1 03/06/2020 1042   VLDL 14 05/26/2017 0830   LDLCALC 76 03/06/2020 1042    CBC    Component Value Date/Time   WBC 4.8 03/06/2020 1042   RBC 5.22 03/06/2020 1042   HGB 15.7 03/06/2020 1042   HCT 46.6 03/06/2020 1042   PLT 180 03/06/2020 1042   MCV 89.3 03/06/2020 1042   MCH 30.1 03/06/2020 1042   MCHC 33.7 03/06/2020 1042   RDW 12.3 03/06/2020 1042   LYMPHSABS 1,858 03/06/2020 1042   MONOABS 678 05/26/2017 0830   EOSABS 0 (L) 03/06/2020 1042   BASOSABS 29 03/06/2020 1042    Hgb A1C Lab Results  Component Value Date   HGBA1C 5.1 03/06/2020       Assessment and Plan:  Asthma Exacerbation:  RX for Pred Taper x 6 days No indication for abx at this time Continue Loratadine and Albuterol He will get Robitussin OTC as needed for cough  Return precautions discussed Follow Up Instructions:    I discussed the assessment and treatment plan with the patient. The  patient was provided an opportunity to ask questions and all were answered. The patient agreed with the plan and demonstrated an understanding of the instructions.   The patient was advised to call back or seek an in-person evaluation if the symptoms worsen or if the condition fails to improve as anticipated.    Nicki Reaper, NP

## 2021-03-20 ENCOUNTER — Other Ambulatory Visit: Payer: Self-pay | Admitting: Family Medicine

## 2021-03-20 ENCOUNTER — Other Ambulatory Visit: Payer: Self-pay

## 2021-03-20 ENCOUNTER — Encounter: Payer: Self-pay | Admitting: Family Medicine

## 2021-03-20 ENCOUNTER — Ambulatory Visit (INDEPENDENT_AMBULATORY_CARE_PROVIDER_SITE_OTHER): Payer: Medicaid Other | Admitting: Family Medicine

## 2021-03-20 VITALS — BP 122/82 | HR 86 | Ht 71.0 in | Wt 194.6 lb

## 2021-03-20 DIAGNOSIS — J4530 Mild persistent asthma, uncomplicated: Secondary | ICD-10-CM | POA: Diagnosis not present

## 2021-03-20 DIAGNOSIS — R5383 Other fatigue: Secondary | ICD-10-CM | POA: Diagnosis not present

## 2021-03-20 DIAGNOSIS — Z79899 Other long term (current) drug therapy: Secondary | ICD-10-CM | POA: Diagnosis not present

## 2021-03-20 DIAGNOSIS — F419 Anxiety disorder, unspecified: Secondary | ICD-10-CM | POA: Diagnosis not present

## 2021-03-20 DIAGNOSIS — M5030 Other cervical disc degeneration, unspecified cervical region: Secondary | ICD-10-CM | POA: Diagnosis not present

## 2021-03-20 DIAGNOSIS — Z87891 Personal history of nicotine dependence: Secondary | ICD-10-CM

## 2021-03-20 DIAGNOSIS — J3089 Other allergic rhinitis: Secondary | ICD-10-CM

## 2021-03-20 DIAGNOSIS — Z125 Encounter for screening for malignant neoplasm of prostate: Secondary | ICD-10-CM | POA: Diagnosis not present

## 2021-03-20 DIAGNOSIS — Z0000 Encounter for general adult medical examination without abnormal findings: Secondary | ICD-10-CM | POA: Diagnosis not present

## 2021-03-20 DIAGNOSIS — E559 Vitamin D deficiency, unspecified: Secondary | ICD-10-CM | POA: Diagnosis not present

## 2021-03-20 DIAGNOSIS — R7309 Other abnormal glucose: Secondary | ICD-10-CM | POA: Diagnosis not present

## 2021-03-20 MED ORDER — LORATADINE 10 MG PO TABS: 10.0000 mg | ORAL_TABLET | Freq: Every day | ORAL | 3 refills | Status: DC

## 2021-03-20 MED ORDER — ALPRAZOLAM 1 MG PO TABS: 1.0000 mg | ORAL_TABLET | Freq: Three times a day (TID) | ORAL | 5 refills | Status: DC

## 2021-03-20 NOTE — Progress Notes (Signed)
Subjective:    Patient ID: Jeffery King, male    DOB: [birth year], 45 y.o.   MRN: [id number]  Jeffery King is a 45 y.o. male presenting on 03/20/2021 for Annual Exam and Back Pain   HPI   Here for Annual Physical and Lab Review.  DJD Cervical Spine / Radiculopathy - Followed up with Dr Iline Oven ([facility]) previously w/ neck injection He continues rehab routine On Baclofen PRN  Lifestyle / Wellness - Recent labs show normal range A1c and Lipids - No significant diet or lifestyle/exercise changes, currently trying to improve  Anxiety He has been managing his anxiety and overall has improved his in general. He continues onXanax 1mg  TID- still trying to wean gradually occasional reduce dose, but currently maintains dose He recently had refill Xanax 90 pill count for 30 day fill on 03/15/21 by 03/17/21, FNP   GERD He has changed his eating habits with significant improvement. Now taking Omeprazole PRN with very good results. His chest tightness has improved as well. He has discontinued junk food and sodas  Asthma Currently controlled on Albuterol PRN Previously on Flovent, in past was on Advair. No longer on Advair. He does breathing exercises. Now gradual worsening with breathing, flare up with productive cough and history of COVID 2021.  Vitamin D Deficiency Previous low lab. Due for repeat  History of recent Influenza last week He had fever 3-4 days, had about 5 days of illness. Has dramatically improved since that time. He had virtual visit saw 2022, FNP and was treated for asthma flare with Prednisone.  Health Maintenance:   No prior colon cancer screening, not due til age 47+ medicaid   Depression screen [facility] 2/9 03/20/2021 03/06/2020 02/15/2019  Decreased Interest 0 0 0  Down, Depressed, Hopeless 0 0 0  PHQ - 2 Score 0 0 0  Altered sleeping 0 - -  Tired, decreased energy 0 - -  Change in appetite 0 - -  Feeling bad or failure about yourself  0 -  -  Trouble concentrating 0 - -  Moving slowly or fidgety/restless 0 - -  Suicidal thoughts 0 - -  PHQ-9 Score 0 - -  Difficult doing work/chores Not difficult at all - -    Past Medical History:  Diagnosis Date  . Anxiety   . Asthma    History reviewed. No pertinent surgical history. Social History   Socioeconomic History  . Marital status: Single    Spouse name: Not on file  . Number of children: Not on file  . Years of education: Not on file  . Highest education level: Not on file  Occupational History  . Not on file  Tobacco Use  . Smoking status: Former Smoker    Quit date: 05/30/2014    Years since quitting: 6.8  . Smokeless tobacco: Former 07/30/2014  . Vaping Use: Never used  Substance and Sexual Activity  . Alcohol use: No  . Drug use: No  . Sexual activity: Not on file  Other Topics Concern  . Not on file  Social History Narrative  . Not on file   Social Determinants of Health   Financial Resource Strain: Not on file  Food Insecurity: Not on file  Transportation Needs: Not on file  Physical Activity: Not on file  Stress: Not on file  Social Connections: Not on file  Intimate Partner Violence: Not on file   Family History  Problem Relation Age of Onset  .  Hypertension Mother   . Hypertension Father   . Hypertension Brother    Current Outpatient Medications on File Prior to Visit  Medication Sig  . omeprazole (PRILOSEC) 40 MG capsule Take 1 capsule (40 mg total) by mouth daily before breakfast.  . PROAIR HFA 108 (90 Base) MCG/ACT inhaler INHALE 2 PUFFS INTO THE LUNGS EVERY 4 HOURS AS NEEDED FOR SHORTNESS OF BREATH OR WHEEZING  . baclofen (LIORESAL) 10 MG tablet Take 0.5-1 tablets (5-10 mg total) by mouth 3 (three) times daily as needed for muscle spasms. (Patient not taking: No sig reported)   No current facility-administered medications on file prior to visit.    Review of Systems  Constitutional: Negative for activity change, appetite  change, chills, diaphoresis, fatigue and fever.  HENT: Negative for congestion and hearing loss.   Eyes: Negative for visual disturbance.  Respiratory: Negative for cough, chest tightness, shortness of breath and wheezing.   Cardiovascular: Negative for chest pain, palpitations and leg swelling.  Gastrointestinal: Negative for abdominal pain, constipation, diarrhea, nausea and vomiting.  Genitourinary: Negative for dysuria, frequency and hematuria.  Musculoskeletal: Negative for arthralgias and neck pain.  Skin: Negative for rash.  Neurological: Negative for dizziness, weakness, light-headedness, numbness and headaches.  Hematological: Negative for adenopathy.  Psychiatric/Behavioral: Negative for behavioral problems, dysphoric mood and sleep disturbance.   Per HPI unless specifically indicated above      Objective:    BP 122/82 (BP Location: Left Arm, Cuff Size: Normal)   Pulse 86   Ht 5\' 11"  (1.803 m)   Wt 194 lb 9.6 oz (88.3 kg)   SpO2 100%   BMI 27.14 kg/m   Wt Readings from Last 3 Encounters:  03/20/21 194 lb 9.6 oz (88.3 kg)  09/08/20 192 lb 9.6 oz (87.4 kg)  03/06/20 185 lb (83.9 kg)    Physical Exam Vitals and nursing note reviewed.  Constitutional:      General: He is not in acute distress.    Appearance: He is well-developed. He is not diaphoretic.     Comments: Well-appearing, comfortable, cooperative  HENT:     Head: Normocephalic and atraumatic.  Eyes:     General:        Right eye: No discharge.        Left eye: No discharge.     Conjunctiva/sclera: Conjunctivae normal.     Pupils: Pupils are equal, round, and reactive to light.  Neck:     Thyroid: No thyromegaly.  Cardiovascular:     Rate and Rhythm: Normal rate and regular rhythm.     Heart sounds: Normal heart sounds. No murmur heard.   Pulmonary:     Effort: Pulmonary effort is normal. No respiratory distress.     Breath sounds: Normal breath sounds. No wheezing or rales.  Abdominal:      General: Bowel sounds are normal. There is no distension.     Palpations: Abdomen is soft. There is no mass.     Tenderness: There is no abdominal tenderness.  Musculoskeletal:        General: No tenderness. Normal range of motion.     Cervical back: Normal range of motion and neck supple.     Comments: Upper / Lower Extremities: - Normal muscle tone, strength bilateral upper extremities 5/5, lower extremities 5/5  Lymphadenopathy:     Cervical: No cervical adenopathy.  Skin:    General: Skin is warm and dry.     Findings: No erythema or rash.  Neurological:  Mental Status: He is alert and oriented to person, place, and time.     Comments: Distal sensation intact to light touch all extremities  Psychiatric:        Behavior: Behavior normal.     Comments: Well groomed, good eye contact, normal speech and thoughts       I have personally reviewed the radiology report from 06/12/18.  CLINICAL DATA:  Hemoptysis.  Chest pain with deep inspiration.  EXAM: CT ANGIOGRAPHY CHEST WITH CONTRAST  TECHNIQUE: Multidetector CT imaging of the chest was performed using the standard protocol during bolus administration of intravenous contrast. Multiplanar CT image reconstructions and MIPs were obtained to evaluate the vascular anatomy.  CONTRAST:  100mL ISOVUE-370 IOPAMIDOL (ISOVUE-370) INJECTION 76%  COMPARISON:  Two-view chest x-ray from the same day.  FINDINGS: Cardiovascular: The heart size is normal. Aortic arch is unremarkable.  Pulmonary artery opacification is excellent. No focal filling defects are present to suggest pulmonary emboli.  Mediastinum/Nodes: No significant mediastinal, hilar, or axillary adenopathy is present thoracic inlet is within normal limits. Esophagus is normal.  Lungs/Pleura: Focal airspace disease is present posteriorly and inferiorly within the lingula. No central obstructing lesion is present. Bronchi are patent. A small left pleural  effusion is present no other focal nodule, mass, or airspace disease is present.  Upper Abdomen: No acute abnormality.  Musculoskeletal: Schmorl's nodes are present in the lower thoracic spine. Vertebral body heights and alignment are otherwise maintained.  Review of the MIP images confirms the above findings.  IMPRESSION: 1. Lingular airspace disease most consistent with segmental pneumonia. 2. No central obstructing lesion. 3. No pulmonary embolus. 4. Small left pleural effusion.   Electronically Signed   By: Marin Robertshristopher  Mattern M.D.   On: 06/12/2018 19:04  Results for orders placed or performed in visit on 03/06/20  Hemoglobin A1c  Result Value Ref Range   Hgb A1c MFr Bld 5.1 <5.7 % of total Hgb   Mean Plasma Glucose 100 (calc)   eAG (mmol/L) 5.5 (calc)  CBC with Differential/Platelet  Result Value Ref Range   WBC 4.8 3.8 - 10.8 Thousand/uL   RBC 5.22 4.20 - 5.80 Million/uL   Hemoglobin 15.7 13.2 - 17.1 g/dL   HCT 16.146.6 09.638.5 - 04.550.0 %   MCV 89.3 80.0 - 100.0 fL   MCH 30.1 27.0 - 33.0 pg   MCHC 33.7 32.0 - 36.0 g/dL   RDW 40.912.3 81.111.0 - 91.415.0 %   Platelets 180 140 - 400 Thousand/uL   MPV 11.6 7.5 - 12.5 fL   Neutro Abs 2,285 1,500 - 7,800 cells/uL   Lymphs Abs 1,858 850 - 3,900 cells/uL   Absolute Monocytes 629 200 - 950 cells/uL   Eosinophils Absolute 0 (L) 15 - 500 cells/uL   Basophils Absolute 29 0 - 200 cells/uL   Neutrophils Relative % 47.6 %   Total Lymphocyte 38.7 %   Monocytes Relative 13.1 %   Eosinophils Relative 0.0 %   Basophils Relative 0.6 %  COMPLETE METABOLIC PANEL WITH GFR  Result Value Ref Range   Glucose, Bld 83 65 - 99 mg/dL   BUN 11 7 - 25 mg/dL   Creat 7.821.10 9.560.60 - 2.131.35 mg/dL   GFR, Est Non African American 81 > OR = 60 mL/min/1.1873m2   GFR, Est African American 94 > OR = 60 mL/min/1.4673m2   BUN/Creatinine Ratio NOT APPLICABLE 6 - 22 (calc)   Sodium 141 135 - 146 mmol/L   Potassium 4.6 3.5 - 5.3 mmol/L   Chloride  104 98 - 110 mmol/L    CO2 28 20 - 32 mmol/L   Calcium 9.5 8.6 - 10.3 mg/dL   Total Protein 7.2 6.1 - 8.1 g/dL   Albumin 4.1 3.6 - 5.1 g/dL   Globulin 3.1 1.9 - 3.7 g/dL (calc)   AG Ratio 1.3 1.0 - 2.5 (calc)   Total Bilirubin 0.6 0.2 - 1.2 mg/dL   Alkaline phosphatase (APISO) 98 36 - 130 U/L   AST 25 10 - 40 U/L   ALT 32 9 - 46 U/L  Lipid panel  Result Value Ref Range   Cholesterol 141 <200 mg/dL   HDL 45 > OR = 40 mg/dL   Triglycerides 412 <878 mg/dL   LDL Cholesterol (Calc) 76 mg/dL (calc)   Total CHOL/HDL Ratio 3.1 <5.0 (calc)   Non-HDL Cholesterol (Calc) 96 <676 mg/dL (calc)  PSA  Result Value Ref Range   PSA 0.8 < OR = 4.0 ng/mL  VITAMIN D 25 Hydroxy (Vit-D Deficiency, Fractures)  Result Value Ref Range   Vit D, 25-Hydroxy 21 (L) 30 - 100 ng/mL  TSH  Result Value Ref Range   TSH 4.48 0.40 - 4.50 mIU/L      Assessment & Plan:   Problem List Items Addressed This Visit    Mild persistent asthma    Stable without flare Chronic asthma, mild persistent - with recent acute flare 1 week ago, since acutely resolved but has persistent gradual worsening with asthma and breathing issues History of COVID  - Trigger with environmental allergies - Off Advair, singulair  Plan: 1. Monitor asthma 2. Use Albuterol PRN 3. Continue Loratadine for allergies  Will consider Pulm Referral for further characterization of asthma / also former smoker, may need PFTs further management.      Environmental and seasonal allergies    Controlled on Loratadine,refill      Relevant Medications   loratadine (ALLERGY RELIEF) 10 MG tablet   Degenerative disc disease, cervical   Anxiety    Controlled Stable, well controlled anxiety with panic, now without panic attacks - H/o >25 years, on xanax stable >7 years - Prior failed SSRI unsure - Checked PDMP today for 2 year, appropriate filling of xanax only Off Buspar  Plan: 1. Re order Alprazolam 1mg  TID - #90, +5 refills       Relevant Medications    ALPRAZolam (XANAX) 1 MG tablet    Other Visit Diagnoses    Annual physical exam    -  Primary      Updated Health Maintenance information Reviewed recent lab results with patient Encouraged improvement to lifestyle with diet and exercise Goal of weight loss   Meds ordered this encounter  Medications  . loratadine (ALLERGY RELIEF) 10 MG tablet    Sig: Take 1 tablet (10 mg total) by mouth daily.    Dispense:  90 tablet    Refill:  3  . ALPRAZolam (XANAX) 1 MG tablet    Sig: Take 1 tablet (1 mg total) by mouth 3 (three) times daily.    Dispense:  90 tablet    Refill:  5    Please keep additional refills on file.      Follow up plan: Return in about 6 months (around 09/19/2021) for 6 month follow-up Anxiety, Asthma, BP.  09/21/2021, DO [facility] 03/20/2021, 9:44 AM

## 2021-03-20 NOTE — Patient Instructions (Addendum)
Thank you for coming to the office today.  [facility] [address], [address] [place], [place] [address] Phone: [phone]  Stay tuned for apt.  Will refill Alprazolam soon and good through next 5 more months.  Please schedule a Follow-up Appointment to: Return in about 6 months (around 09/19/2021) for 6 month follow-up Anxiety, Asthma, BP.  If you have any other questions or concerns, please feel free to call the office or send a message through MyChart. You may also schedule an earlier appointment if necessary.  Additionally, you may be receiving a survey about your experience at our office within a few days to 1 week by e-mail or mail. We value your feedback.  [name], DO [facility], [place]

## 2021-03-20 NOTE — Assessment & Plan Note (Signed)
Controlled on Loratadine,refill

## 2021-03-20 NOTE — Assessment & Plan Note (Signed)
Controlled Stable, well controlled anxiety with panic, now without panic attacks - H/o >25 years, on xanax stable >7 years - Prior failed SSRI unsure - Checked PDMP today for 2 year, appropriate filling of xanax only Off Buspar  Plan: 1. Re order Alprazolam 1mg  TID - #90, +5 refills

## 2021-03-20 NOTE — Assessment & Plan Note (Signed)
Stable without flare Chronic asthma, mild persistent - with recent acute flare 1 week ago, since acutely resolved but has persistent gradual worsening with asthma and breathing issues History of COVID  - Trigger with environmental allergies - Off Advair, singulair  Plan: 1. Monitor asthma 2. Use Albuterol PRN 3. Continue Loratadine for allergies  Will consider Pulm Referral for further characterization of asthma / also former smoker, may need PFTs further management.

## 2021-03-21 LAB — CBC WITH DIFFERENTIAL/PLATELET
Absolute Monocytes: 780 cells/uL (ref 200–950)
Basophils Absolute: 19 cells/uL (ref 0–200)
Basophils Relative: 0.4 %
Eosinophils Absolute: 9 cells/uL — ABNORMAL LOW (ref 15–500)
Eosinophils Relative: 0.2 %
HCT: 51.3 % — ABNORMAL HIGH (ref 38.5–50.0)
Hemoglobin: 17 g/dL (ref 13.2–17.1)
Lymphs Abs: 1476 cells/uL (ref 850–3900)
MCH: 30.7 pg (ref 27.0–33.0)
MCHC: 33.1 g/dL (ref 32.0–36.0)
MCV: 92.6 fL (ref 80.0–100.0)
MPV: 10.6 fL (ref 7.5–12.5)
Monocytes Relative: 16.6 %
Neutro Abs: 2416 cells/uL (ref 1500–7800)
Neutrophils Relative %: 51.4 %
Platelets: 216 10*3/uL (ref 140–400)
RBC: 5.54 10*6/uL (ref 4.20–5.80)
RDW: 12.9 % (ref 11.0–15.0)
Total Lymphocyte: 31.4 %
WBC: 4.7 10*3/uL (ref 3.8–10.8)

## 2021-03-21 LAB — LIPID PANEL
Cholesterol: 173 mg/dL (ref <200)
HDL: 50 mg/dL (ref >=40)
LDL Cholesterol (Calc): 96 mg/dL (calc)
Non-HDL Cholesterol (Calc): 123 mg/dL (calc) (ref <130)
Total CHOL/HDL Ratio: 3.5 (calc) (ref <5.0)
Triglycerides: 173 mg/dL — ABNORMAL HIGH (ref <150)

## 2021-03-21 LAB — COMPLETE METABOLIC PANEL WITH GFR
AG Ratio: 1.2 (calc) (ref 1.0–2.5)
ALT: 30 U/L (ref 9–46)
AST: 27 U/L (ref 10–40)
Albumin: 4.3 g/dL (ref 3.6–5.1)
Alkaline phosphatase (APISO): 93 U/L (ref 36–130)
BUN: 11 mg/dL (ref 7–25)
CO2: 29 mmol/L (ref 20–32)
Calcium: 9.5 mg/dL (ref 8.6–10.3)
Chloride: 104 mmol/L (ref 98–110)
Creat: 1.15 mg/dL (ref 0.60–1.35)
GFR, Est African American: 89 mL/min/{1.73_m2} (ref >=60)
GFR, Est Non African American: 76 mL/min/{1.73_m2} (ref >=60)
Globulin: 3.6 g/dL (calc) (ref 1.9–3.7)
Glucose, Bld: 84 mg/dL (ref 65–99)
Potassium: 4.4 mmol/L (ref 3.5–5.3)
Sodium: 141 mmol/L (ref 135–146)
Total Bilirubin: 0.5 mg/dL (ref 0.2–1.2)
Total Protein: 7.9 g/dL (ref 6.1–8.1)

## 2021-03-21 LAB — HEMOGLOBIN A1C
Hgb A1c MFr Bld: 5.2 % of total Hgb (ref <5.7)
Mean Plasma Glucose: 103 mg/dL
eAG (mmol/L): 5.7 mmol/L

## 2021-03-21 LAB — PSA: PSA: 0.88 ng/mL (ref <=4.00)

## 2021-03-21 LAB — VITAMIN D 25 HYDROXY (VIT D DEFICIENCY, FRACTURES): Vit D, 25-Hydroxy: 31 ng/mL (ref 30–100)

## 2021-03-27 ENCOUNTER — Ambulatory Visit: Payer: Medicaid Other | Admitting: Family Medicine

## 2021-06-06 ENCOUNTER — Other Ambulatory Visit: Payer: Self-pay | Admitting: Family Medicine

## 2021-06-06 DIAGNOSIS — J4530 Mild persistent asthma, uncomplicated: Secondary | ICD-10-CM

## 2021-08-02 ENCOUNTER — Other Ambulatory Visit: Payer: Self-pay | Admitting: Family Medicine

## 2021-08-02 DIAGNOSIS — J4530 Mild persistent asthma, uncomplicated: Secondary | ICD-10-CM

## 2021-08-02 MED ORDER — VENTOLIN HFA 108 (90 BASE) MCG/ACT IN AERS: 2.0000 | INHALATION_SPRAY | RESPIRATORY_TRACT | 11 refills | Status: DC | PRN

## 2021-09-18 ENCOUNTER — Encounter: Payer: Self-pay | Admitting: Family Medicine

## 2021-09-18 ENCOUNTER — Ambulatory Visit (INDEPENDENT_AMBULATORY_CARE_PROVIDER_SITE_OTHER): Payer: Medicaid Other | Admitting: Family Medicine

## 2021-09-18 ENCOUNTER — Other Ambulatory Visit: Payer: Self-pay

## 2021-09-18 VITALS — BP 115/84 | HR 106 | Ht 71.0 in | Wt 192.2 lb

## 2021-09-18 DIAGNOSIS — K219 Gastro-esophageal reflux disease without esophagitis: Secondary | ICD-10-CM

## 2021-09-18 DIAGNOSIS — J4530 Mild persistent asthma, uncomplicated: Secondary | ICD-10-CM

## 2021-09-18 DIAGNOSIS — F419 Anxiety disorder, unspecified: Secondary | ICD-10-CM

## 2021-09-18 MED ORDER — ALPRAZOLAM 1 MG PO TABS
1.0000 mg | ORAL_TABLET | Freq: Three times a day (TID) | ORAL | 5 refills | Status: DC
Start: 2021-09-18 — End: 2022-03-27

## 2021-09-18 NOTE — Assessment & Plan Note (Signed)
Stable without flare Chronic asthma, mild persistent - with recent acute flare 1 week ago, since acutely resolved but has persistent gradual worsening with asthma and breathing issues History of COVID  - Trigger with environmental allergies - Off Advair, singulair  Plan: 1. Monitor asthma 2. Use Albuterol PRN 3. Continue Loratadine for allergies  Likely GERD related component  Anticipate repeat Pulm referral - he was unable to see them last time - will send in spring when patient requests

## 2021-09-18 NOTE — Progress Notes (Signed)
Subjective:    Patient ID: Jeffery King, male    DOB: [birth year], 45 y.o.   MRN: [id number]  Jeffery King is a 45 y.o. male presenting on 09/18/2021 for Asthma, Anxiety, and Hypertension   HPI  DJD Cervical Spine / Radiculopathy - Followed up with Dr Iline Oven ([facility]) previously w/ neck injection He continues rehab routine On Baclofen PRN not taking often.    Anxiety He has been managing his anxiety and overall has improved his in general. He continues on Xanax 1mg  TID - still trying to wean gradually occasional reduce dose, but currently maintains dose He recently had refill Xanax 90 pill count for 30 day fill   GERD He has changed his eating habits with significant improvement. Still taking Omeprazole PRN with very good results. His chest tightness has improved as well. He has discontinued junk food and sodas   Asthma, mild persistent Currently controlled on Albuterol PRN Previously on Flovent, in past was on Advair. No longer on Advair. He does breathing exercises. He has episodic flares of GERD related stomach acid causing flare of asthma and him to regurgitate some liquid and food/sputum. Reconsider referral to Pulmonology, when he is ready, deferred last time was not able to schedule to see them back in Spring 2022.   Health Maintenance:  No prior colon cancer screening, not due til age 89+ medicaid   Depression screen [facility] 2/9 09/18/2021 03/20/2021 03/06/2020  Decreased Interest 2 0 0  Down, Depressed, Hopeless 0 0 0  PHQ - 2 Score 2 0 0  Altered sleeping 0 0 -  Tired, decreased energy 1 0 -  Change in appetite 1 0 -  Feeling bad or failure about yourself  0 0 -  Trouble concentrating 0 0 -  Moving slowly or fidgety/restless 0 0 -  Suicidal thoughts 0 0 -  PHQ-9 Score 4 0 -  Difficult doing work/chores Not difficult at all Not difficult at all -   GAD 7 : Generalized Anxiety Score 09/18/2021 03/20/2021 02/15/2019 06/01/2018  Nervous, Anxious, on Edge 0 0 (No  Data) (No Data)  Control/stop worrying 0 0 - -  Worry too much - different things 0 0 - -  Trouble relaxing 0 0 - -  Restless 1 0 - -  Easily annoyed or irritable 1 0 - -  Afraid - awful might happen 0 0 - -  Total GAD 7 Score 2 0 - -  Anxiety Difficulty Not difficult at all Not difficult at all - -     Social History   Tobacco Use   Smoking status: Former    Types: Cigarettes    Quit date: 05/30/2014    Years since quitting: 7.3   Smokeless tobacco: Former  07/30/2014 Use: Never used  Substance Use Topics   Alcohol use: No   Drug use: No    Review of Systems Per HPI unless specifically indicated above     Objective:    BP 115/84 (BP Location: Left Arm, Cuff Size: Normal)   Pulse (!) 106   Ht 5\' 11"  (1.803 m)   Wt 192 lb 3.2 oz (87.2 kg)   SpO2 96%   BMI 26.81 kg/m   Wt Readings from Last 3 Encounters:  09/18/21 192 lb 3.2 oz (87.2 kg)  03/20/21 194 lb 9.6 oz (88.3 kg)  09/08/20 192 lb 9.6 oz (87.4 kg)    Physical Exam Vitals and nursing note reviewed.  Constitutional:  General: He is not in acute distress.    Appearance: He is well-developed. He is not diaphoretic.     Comments: Well-appearing, comfortable, cooperative  HENT:     Head: Normocephalic and atraumatic.  Eyes:     General:        Right eye: No discharge.        Left eye: No discharge.     Conjunctiva/sclera: Conjunctivae normal.  Neck:     Thyroid: No thyromegaly.  Cardiovascular:     Rate and Rhythm: Normal rate and regular rhythm.     Pulses: Normal pulses.     Heart sounds: Normal heart sounds. No murmur heard. Pulmonary:     Effort: Pulmonary effort is normal. No respiratory distress.     Breath sounds: Normal breath sounds. No wheezing or rales.  Musculoskeletal:        General: Normal range of motion.     Cervical back: Normal range of motion and neck supple.  Lymphadenopathy:     Cervical: No cervical adenopathy.  Skin:    General: Skin is warm and dry.      Findings: No erythema or rash.  Neurological:     Mental Status: He is alert and oriented to person, place, and time. Mental status is at baseline.  Psychiatric:        Behavior: Behavior normal.     Comments: Well groomed, good eye contact, normal speech and thoughts   Results for orders placed or performed in visit on 03/15/21  VITAMIN D 25 Hydroxy (Vit-D Deficiency, Fractures)  Result Value Ref Range   Vit D, 25-Hydroxy 31 30 - 100 ng/mL  PSA  Result Value Ref Range   PSA 0.88 < OR = 4.00 ng/mL  Lipid panel  Result Value Ref Range   Cholesterol 173 <200 mg/dL   HDL 50 > OR = 40 mg/dL   Triglycerides 371 (H) <150 mg/dL   LDL Cholesterol (Calc) 96 mg/dL (calc)   Total CHOL/HDL Ratio 3.5 <5.0 (calc)   Non-HDL Cholesterol (Calc) 123 <130 mg/dL (calc)  COMPLETE METABOLIC PANEL WITH GFR  Result Value Ref Range   Glucose, Bld 84 65 - 99 mg/dL   BUN 11 7 - 25 mg/dL   Creat 6.96 7.89 - 3.81 mg/dL   GFR, Est Non African American 76 > OR = 60 mL/min/1.46m2   GFR, Est African American 89 > OR = 60 mL/min/1.51m2   BUN/Creatinine Ratio NOT APPLICABLE 6 - 22 (calc)   Sodium 141 135 - 146 mmol/L   Potassium 4.4 3.5 - 5.3 mmol/L   Chloride 104 98 - 110 mmol/L   CO2 29 20 - 32 mmol/L   Calcium 9.5 8.6 - 10.3 mg/dL   Total Protein 7.9 6.1 - 8.1 g/dL   Albumin 4.3 3.6 - 5.1 g/dL   Globulin 3.6 1.9 - 3.7 g/dL (calc)   AG Ratio 1.2 1.0 - 2.5 (calc)   Total Bilirubin 0.5 0.2 - 1.2 mg/dL   Alkaline phosphatase (APISO) 93 36 - 130 U/L   AST 27 10 - 40 U/L   ALT 30 9 - 46 U/L  CBC with Differential/Platelet  Result Value Ref Range   WBC 4.7 3.8 - 10.8 Thousand/uL   RBC 5.54 4.20 - 5.80 Million/uL   Hemoglobin 17.0 13.2 - 17.1 g/dL   HCT 01.7 (H) 51.0 - 25.8 %   MCV 92.6 80.0 - 100.0 fL   MCH 30.7 27.0 - 33.0 pg   MCHC 33.1 32.0 - 36.0 g/dL  RDW 12.9 11.0 - 15.0 %   Platelets 216 140 - 400 Thousand/uL   MPV 10.6 7.5 - 12.5 fL   Neutro Abs 2,416 1,500 - 7,800 cells/uL   Lymphs Abs  1,476 850 - 3,900 cells/uL   Absolute Monocytes 780 200 - 950 cells/uL   Eosinophils Absolute 9 (L) 15 - 500 cells/uL   Basophils Absolute 19 0 - 200 cells/uL   Neutrophils Relative % 51.4 %   Total Lymphocyte 31.4 %   Monocytes Relative 16.6 %   Eosinophils Relative 0.2 %   Basophils Relative 0.4 %  Hemoglobin A1c  Result Value Ref Range   Hgb A1c MFr Bld 5.2 <5.7 % of total Hgb   Mean Plasma Glucose 103 mg/dL   eAG (mmol/L) 5.7 mmol/L      Assessment & Plan:   Problem List Items Addressed This Visit     Mild persistent asthma - Primary    Stable without flare Chronic asthma, mild persistent - with recent acute flare 1 week ago, since acutely resolved but has persistent gradual worsening with asthma and breathing issues History of COVID  - Trigger with environmental allergies - Off Advair, singulair  Plan: 1. Monitor asthma 2. Use Albuterol PRN 3. Continue Loratadine for allergies  Likely GERD related component  Anticipate repeat Pulm referral - he was unable to see them last time - will send in spring when patient requests       Gastroesophageal reflux disease without esophagitis    GERD controlled on PRN PPI Episodic rare episode assoc with asthma / regurgitation coughing Infrequent PPI use F/u with Pulm in future if referred vs GI      Anxiety    Controlled Stable, well controlled anxiety with panic, now without panic attacks - H/o >25 years, on xanax stable >7 years - Prior failed SSRI unsure - Checked PDMP today for 2 year, appropriate filling of xanax only Off Buspar  Plan: 1. Re order Alprazolam 1mg  TID - #90, +5 refills       Relevant Medications   ALPRAZolam (XANAX) 1 MG tablet    Meds ordered this encounter  Medications   ALPRAZolam (XANAX) 1 MG tablet    Sig: Take 1 tablet (1 mg total) by mouth 3 (three) times daily.    Dispense:  90 tablet    Refill:  5    Please keep additional refills on file.      Follow up plan: Return in  about 6 months (around 03/18/2022) for 6 month Annual Physical AM fasting lab AFTER.   03/20/2022, DO [facility] 09/18/2021, 11:19 AM

## 2021-09-18 NOTE — Assessment & Plan Note (Signed)
Controlled Stable, well controlled anxiety with panic, now without panic attacks - H/o >25 years, on xanax stable >7 years - Prior failed SSRI unsure - Checked PDMP today for 2 year, appropriate filling of xanax only Off Buspar  Plan: 1. Re order Alprazolam 1mg  TID - #90, +5 refills

## 2021-09-18 NOTE — Assessment & Plan Note (Signed)
GERD controlled on PRN PPI Episodic rare episode assoc with asthma / regurgitation coughing Infrequent PPI use F/u with Pulm in future if referred vs GI

## 2021-09-18 NOTE — Patient Instructions (Addendum)
Thank you for coming to the office today.  Message or call when ready for the Pulmonology referral.  Refilled Alprazolam for 6 months.  Let me know if need the others.  DUE for FASTING BLOOD WORK (no food or drink after midnight before the lab appointment, only water or coffee without cream/sugar on the morning of)  SCHEDULE "Lab Only" visit in the morning at the clinic for lab draw in 6 MONTHS   - Make sure Lab Only appointment is at about 1 week before your next appointment, so that results will be available  For Lab Results, once available within 2-3 days of blood draw, you can can log in to MyChart online to view your results and a brief explanation. Also, we can discuss results at next follow-up visit.   Please schedule a Follow-up Appointment to: Return in about 6 months (around 03/18/2022) for 6 month Annual Physical AM fasting lab AFTER.  If you have any other questions or concerns, please feel free to call the office or send a message through MyChart. You may also schedule an earlier appointment if necessary.  Additionally, you may be receiving a survey about your experience at our office within a few days to 1 week by e-mail or mail. We value your feedback.  [name], DO [facility], [place]

## 2022-03-27 ENCOUNTER — Ambulatory Visit (INDEPENDENT_AMBULATORY_CARE_PROVIDER_SITE_OTHER): Payer: Medicaid Other | Admitting: Family Medicine

## 2022-03-27 ENCOUNTER — Encounter: Payer: Self-pay | Admitting: Family Medicine

## 2022-03-27 VITALS — BP 126/98 | HR 103 | Ht 71.0 in | Wt 180.6 lb

## 2022-03-27 DIAGNOSIS — Z0000 Encounter for general adult medical examination without abnormal findings: Secondary | ICD-10-CM | POA: Diagnosis not present

## 2022-03-27 DIAGNOSIS — F419 Anxiety disorder, unspecified: Secondary | ICD-10-CM | POA: Diagnosis not present

## 2022-03-27 DIAGNOSIS — Z125 Encounter for screening for malignant neoplasm of prostate: Secondary | ICD-10-CM | POA: Diagnosis not present

## 2022-03-27 DIAGNOSIS — R7309 Other abnormal glucose: Secondary | ICD-10-CM

## 2022-03-27 DIAGNOSIS — E559 Vitamin D deficiency, unspecified: Secondary | ICD-10-CM

## 2022-03-27 DIAGNOSIS — J4530 Mild persistent asthma, uncomplicated: Secondary | ICD-10-CM | POA: Diagnosis not present

## 2022-03-27 DIAGNOSIS — E7800 Pure hypercholesterolemia, unspecified: Secondary | ICD-10-CM | POA: Diagnosis not present

## 2022-03-27 MED ORDER — ALPRAZOLAM 1 MG PO TABS: 1.0000 mg | ORAL_TABLET | Freq: Three times a day (TID) | ORAL | 5 refills | Status: DC

## 2022-03-27 NOTE — Patient Instructions (Addendum)
Thank you for coming to the office today.  Reassurance that lymph nodes appear normal today.  Labs ordered.  Could check on status of Colonoscopy vs Cologuard if covered under Medicaid plan. Age 47+ for commercial insurance, but medicaid / medicare usually is 50+ - so may not be able to do this yet.   DUE for FASTING BLOOD WORK (no food or drink after midnight before the lab appointment, only water or coffee without cream/sugar on the morning of)  SCHEDULE "Lab Only" visit in the morning at the clinic for lab draw in 1 WEEK  - Make sure Lab Only appointment is at about 1 week before your next appointment, so that results will be available  For Lab Results, once available within 2-3 days of blood draw, you can can log in to MyChart online to view your results and a brief explanation. Also, we can discuss results at next follow-up visit.   Please schedule a Follow-up Appointment to: Return in about 1 week (around 04/03/2022) for within 1 week return fasting lab only then 6 month follow up.  If you have any other questions or concerns, please feel free to call the office or send a message through MyChart. You may also schedule an earlier appointment if necessary.  Additionally, you may be receiving a survey about your experience at our office within a few days to 1 week by e-mail or mail. We value your feedback.  [name], DO [facility], [place]

## 2022-03-27 NOTE — Progress Notes (Signed)
Subjective:    Patient ID: Jeffery King, male    DOB: 1976/08/31, 46 y.o.   MRN: [id number]  Jeffery King is a 46 y.o. male presenting on 03/27/2022 for Annual Exam   HPI  Here for Annual Physical and Lab Review.   DJD Cervical Spine / Radiculopathy - Followed up with Dr Iline Oven ([facility]) previously w/ neck injection He continues rehab routine Off Baclofen PRN   Lifestyle / Wellness - Recent labs show normal range A1c and Lipids - No significant diet or lifestyle/exercise changes, currently trying to improve   Anxiety He has been managing his anxiety and overall has improved his in general. He continues on Xanax 1mg  TID - still trying to wean gradually occasional reduce dose, but currently maintains dose   GERD He has changed his eating habits with significant improvement. Now taking Omeprazole PRN with very good results. His chest tightness has improved as well. He has discontinued junk food and sodas   Asthma Currently controlled on Albuterol PRN Previously on Flovent, in past was on Advair. No longer on Advair. He does breathing exercises.   Vitamin D Deficiency Previous low lab. Due for repeat   Health Maintenance:   No prior colon cancer screening, not due til age 59+ medicaid      03/27/2022    9:11 AM 09/18/2021   10:50 AM 03/20/2021    9:31 AM  Depression screen PHQ 2/9  Decreased Interest 2 2 0  Down, Depressed, Hopeless 0 0 0  PHQ - 2 Score 2 2 0  Altered sleeping 0 0 0  Tired, decreased energy 1 1 0  Change in appetite 1 1 0  Feeling bad or failure about yourself  0 0 0  Trouble concentrating 0 0 0  Moving slowly or fidgety/restless 0 0 0  Suicidal thoughts 0 0 0  PHQ-9 Score 4 4 0  Difficult doing work/chores Not difficult at all Not difficult at all Not difficult at all    Past Medical History:  Diagnosis Date   Anxiety    Asthma    History reviewed. No pertinent surgical history. Social History   Socioeconomic History   Marital  status: Single    Spouse name: Not on file   Number of children: Not on file   Years of education: Not on file   Highest education level: Not on file  Occupational History   Not on file  Tobacco Use   Smoking status: Former    Types: Cigarettes    Quit date: 05/30/2014    Years since quitting: 7.8   Smokeless tobacco: Former  07/30/2014 Use: Never used  Substance and Sexual Activity   Alcohol use: No   Drug use: No   Sexual activity: Not on file  Other Topics Concern   Not on file  Social History Narrative   Not on file   Social Determinants of Health   Financial Resource Strain: Not on file  Food Insecurity: Not on file  Transportation Needs: Not on file  Physical Activity: Not on file  Stress: Not on file  Social Connections: Not on file  Intimate Partner Violence: Not on file   Family History  Problem Relation Age of Onset   Hypertension Mother    Hypertension Father    Hypertension Brother    Current Outpatient Medications on File Prior to Visit  Medication Sig   loratadine (ALLERGY RELIEF) 10 MG tablet Take 1 tablet (10 mg total)  by mouth daily.   VENTOLIN HFA 108 (90 Base) MCG/ACT inhaler Inhale 2 puffs into the lungs every 4 (four) hours as needed for wheezing or shortness of breath.   No current facility-administered medications on file prior to visit.    Review of Systems  Constitutional:  Negative for activity change, appetite change, chills, diaphoresis, fatigue and fever.  HENT:  Negative for congestion and hearing loss.   Eyes:  Negative for visual disturbance.  Respiratory:  Negative for cough, chest tightness, shortness of breath and wheezing.   Cardiovascular:  Negative for chest pain, palpitations and leg swelling.  Gastrointestinal:  Negative for abdominal pain, constipation, diarrhea, nausea and vomiting.  Genitourinary:  Negative for dysuria, frequency and hematuria.  Musculoskeletal:  Negative for arthralgias and neck pain.  Skin:   Negative for rash.  Neurological:  Negative for dizziness, weakness, light-headedness, numbness and headaches.  Hematological:  Negative for adenopathy.  Psychiatric/Behavioral:  Negative for behavioral problems, dysphoric mood and sleep disturbance.   Per HPI unless specifically indicated above      Objective:    BP (!) 126/98   Pulse (!) 103   Ht  (1.803 m)   Wt 180 lb 9.6 oz (81.9 kg)   SpO2 99%   BMI 25.19 kg/m   Wt Readings from Last 3 Encounters:  03/27/22 180 lb 9.6 oz (81.9 kg)  09/18/21 192 lb 3.2 oz (87.2 kg)  03/20/21 194 lb 9.6 oz (88.3 kg)    Physical Exam Vitals and nursing note reviewed.  Constitutional:      General: He is not in acute distress.    Appearance: He is well-developed. He is not diaphoretic.     Comments: Well-appearing, comfortable, cooperative  HENT:     Head: Normocephalic and atraumatic.  Eyes:     General:        Right eye: No discharge.        Left eye: No discharge.     Conjunctiva/sclera: Conjunctivae normal.     Pupils: Pupils are equal, round, and reactive to light.  Neck:     Thyroid: No thyromegaly.  Cardiovascular:     Rate and Rhythm: Normal rate and regular rhythm.     Pulses: Normal pulses.     Heart sounds: Normal heart sounds. No murmur heard. Pulmonary:     Effort: Pulmonary effort is normal. No respiratory distress.     Breath sounds: Normal breath sounds. No wheezing or rales.  Abdominal:     General: Bowel sounds are normal. There is no distension.     Palpations: Abdomen is soft. There is no mass.     Tenderness: There is no abdominal tenderness.  Musculoskeletal:        General: No tenderness. Normal range of motion.     Cervical back: Normal range of motion and neck supple.     Comments: Upper / Lower Extremities: - Normal muscle tone, strength bilateral upper extremities 5/5, lower extremities 5/5  Lymphadenopathy:     Cervical: No cervical adenopathy.  Skin:    General: Skin is warm and dry.      Findings: No erythema or rash.  Neurological:     Mental Status: He is alert and oriented to person, place, and time.     Comments: Distal sensation intact to light touch all extremities  Psychiatric:        Mood and Affect: Mood normal.        Behavior: Behavior normal.  Thought Content: Thought content normal.     Comments: Well groomed, good eye contact, normal speech and thoughts   Results for orders placed or performed in visit on 03/15/21  VITAMIN D 25 Hydroxy (Vit-D Deficiency, Fractures)  Result Value Ref Range   Vit D, 25-Hydroxy 31 30 - 100 ng/mL  PSA  Result Value Ref Range   PSA 0.88 < OR = 4.00 ng/mL  Lipid panel  Result Value Ref Range   Cholesterol 173 <200 mg/dL   HDL 50 > OR = 40 mg/dL   Triglycerides 376 (H) <150 mg/dL   LDL Cholesterol (Calc) 96 mg/dL (calc)   Total CHOL/HDL Ratio 3.5 <5.0 (calc)   Non-HDL Cholesterol (Calc) 123 <130 mg/dL (calc)  COMPLETE METABOLIC PANEL WITH GFR  Result Value Ref Range   Glucose, Bld 84 65 - 99 mg/dL   BUN 11 7 - 25 mg/dL   Creat 2.83 1.51 - 7.61 mg/dL   GFR, Est Non African American 76 > OR = 60 mL/min/1.9m2   GFR, Est African American 89 > OR = 60 mL/min/1.20m2   BUN/Creatinine Ratio NOT APPLICABLE 6 - 22 (calc)   Sodium 141 135 - 146 mmol/L   Potassium 4.4 3.5 - 5.3 mmol/L   Chloride 104 98 - 110 mmol/L   CO2 29 20 - 32 mmol/L   Calcium 9.5 8.6 - 10.3 mg/dL   Total Protein 7.9 6.1 - 8.1 g/dL   Albumin 4.3 3.6 - 5.1 g/dL   Globulin 3.6 1.9 - 3.7 g/dL (calc)   AG Ratio 1.2 1.0 - 2.5 (calc)   Total Bilirubin 0.5 0.2 - 1.2 mg/dL   Alkaline phosphatase (APISO) 93 36 - 130 U/L   AST 27 10 - 40 U/L   ALT 30 9 - 46 U/L  CBC with Differential/Platelet  Result Value Ref Range   WBC 4.7 3.8 - 10.8 Thousand/uL   RBC 5.54 4.20 - 5.80 Million/uL   Hemoglobin 17.0 13.2 - 17.1 g/dL   HCT 60.7 (H) 37.1 - 06.2 %   MCV 92.6 80.0 - 100.0 fL   MCH 30.7 27.0 - 33.0 pg   MCHC 33.1 32.0 - 36.0 g/dL   RDW 69.4 85.4 - 62.7  %   Platelets 216 140 - 400 Thousand/uL   MPV 10.6 7.5 - 12.5 fL   Neutro Abs 2,416 1,500 - 7,800 cells/uL   Lymphs Abs 1,476 850 - 3,900 cells/uL   Absolute Monocytes 780 200 - 950 cells/uL   Eosinophils Absolute 9 (L) 15 - 500 cells/uL   Basophils Absolute 19 0 - 200 cells/uL   Neutrophils Relative % 51.4 %   Total Lymphocyte 31.4 %   Monocytes Relative 16.6 %   Eosinophils Relative 0.2 %   Basophils Relative 0.4 %  Hemoglobin A1c  Result Value Ref Range   Hgb A1c MFr Bld 5.2 <5.7 % of total Hgb   Mean Plasma Glucose 103 mg/dL   eAG (mmol/L) 5.7 mmol/L      Assessment & Plan:   Problem List Items Addressed This Visit     Mild persistent asthma   Anxiety   Relevant Medications   ALPRAZolam (XANAX) 1 MG tablet   Other Relevant Orders   COMPLETE METABOLIC PANEL WITH GFR   CBC with Differential/Platelet   Other Visit Diagnoses     Annual physical exam    -  Primary   Relevant Orders   COMPLETE METABOLIC PANEL WITH GFR   CBC with Differential/Platelet   Lipid panel  Hemoglobin A1c   Abnormal glucose       Relevant Orders   Hemoglobin A1c   Screening for prostate cancer       Relevant Orders   PSA   Vitamin D deficiency       Relevant Orders   VITAMIN D 25 Hydroxy (Vit-D Deficiency, Fractures)   Elevated LDL cholesterol level       Relevant Orders   Lipid panel   TSH       Updated Health Maintenance information Future fasting labs Encouraged improvement to lifestyle with diet and exercise Goal of weight loss  Reassurance that lymph nodes appear normal today.  Labs ordered.  Could check on status of Colonoscopy vs Cologuard if covered under Medicaid plan. Age 47+ for commercial insurance, but medicaid / medicare usually is 50+ - so may not be able to do this yet.  Refill Xanax today  Orders Placed This Encounter  Procedures   COMPLETE METABOLIC PANEL WITH GFR    Standing Status:   Future    Standing Expiration Date:   03/28/2023   CBC with  Differential/Platelet    Standing Status:   Future    Standing Expiration Date:   03/28/2023   Lipid panel    Standing Status:   Future    Standing Expiration Date:   03/28/2023    Order Specific Question:   Has the patient fasted?    Answer:   Yes   Hemoglobin A1c    Standing Status:   Future    Standing Expiration Date:   03/28/2023   PSA    Standing Status:   Future    Standing Expiration Date:   03/28/2023   VITAMIN D 25 Hydroxy (Vit-D Deficiency, Fractures)    Standing Status:   Future    Standing Expiration Date:   03/28/2023   TSH    Standing Status:   Future    Standing Expiration Date:   03/28/2023     Meds ordered this encounter  Medications   ALPRAZolam (XANAX) 1 MG tablet    Sig: Take 1 tablet (1 mg total) by mouth 3 (three) times daily.    Dispense:  90 tablet    Refill:  5    Please keep additional refills on file.      Follow up plan: Return in about 1 week (around 04/03/2022) for within 1 week return fasting lab only then 6 month follow up.  Saralyn PilarAlexander Naimah Yingst, DO [facility] 03/27/2022, 9:27 AM

## 2022-03-28 ENCOUNTER — Other Ambulatory Visit: Payer: Self-pay | Admitting: Family Medicine

## 2022-03-28 DIAGNOSIS — B351 Tinea unguium: Secondary | ICD-10-CM

## 2022-03-28 NOTE — Telephone Encounter (Signed)
Requested medication (s) are due for refill today: yes  Requested medication (s) are on the active medication list: no  Last refill:  09/08/20  Future visit scheduled: no just had annual visit yesterday  Notes to clinic:  rx was dc on 03/20/21 by provider for completed course. Please advise      Requested Prescriptions  Pending Prescriptions Disp Refills   terbinafine (LAMISIL) 250 MG tablet [Pharmacy Med Name: TERBINAFINE HCL 250 MG TAB] 30 tablet 2    Sig: ONE TABLET BY MOUTH DAILY FOR UP TO 3 MONTHS DONT TAKE AT SAME TIME AS FLUCONAZOLE     Off-Protocol Failed - 03/28/2022  8:49 AM      Failed - Medication not assigned to a protocol, review manually.      Passed - Valid encounter within last 12 months    Recent Outpatient Visits           Yesterday Annual physical exam   [place], DO   6 months ago Mild persistent asthma without complication   [place], DO   1 year ago Annual physical exam   [place], DO   1 year ago Mild intermittent asthma with exacerbation   [facility] [place], Coralie Keens, NP   1 year ago Onychomycosis of toenail   [place], Devonne Doughty, [place]

## 2022-08-15 ENCOUNTER — Other Ambulatory Visit: Payer: Self-pay | Admitting: Family Medicine

## 2022-08-15 ENCOUNTER — Telehealth: Payer: Medicaid Other | Admitting: Physician Assistant

## 2022-08-15 ENCOUNTER — Ambulatory Visit: Payer: Self-pay

## 2022-08-15 DIAGNOSIS — J40 Bronchitis, not specified as acute or chronic: Secondary | ICD-10-CM

## 2022-08-15 DIAGNOSIS — J4530 Mild persistent asthma, uncomplicated: Secondary | ICD-10-CM

## 2022-08-15 DIAGNOSIS — J329 Chronic sinusitis, unspecified: Secondary | ICD-10-CM

## 2022-08-15 MED ORDER — DOXYCYCLINE HYCLATE 100 MG PO TABS: 100.0000 mg | ORAL_TABLET | Freq: Two times a day (BID) | ORAL | 0 refills | Status: DC

## 2022-08-15 MED ORDER — GUAIFENESIN ER 600 MG PO TB12: 600.0000 mg | ORAL_TABLET | Freq: Two times a day (BID) | ORAL | 0 refills | Status: DC

## 2022-08-15 NOTE — Patient Instructions (Signed)
Karl Bales, thank you for joining Margaretann Loveless, PA-C for today's virtual visit.  While this provider is not your primary care provider (PCP), if your PCP is located in our provider database this encounter information will be shared with them immediately following your visit.   A [facility] MyChart account gives you access to today's visit and all your visits, tests, and labs performed at [facility] " click here if you don't have a [facility] MyChart account or go to [email or web address]  Consent: (Patient) Karl Bales provided verbal consent for this virtual visit at the beginning of the encounter.  Current Medications:  Current Outpatient Medications:    doxycycline (VIBRA-TABS) 100 MG tablet, Take 1 tablet (100 mg total) by mouth 2 (two) times daily., Disp: 20 tablet, Rfl: 0   guaiFENesin (MUCINEX) 600 MG 12 hr tablet, Take 1 tablet (600 mg total) by mouth 2 (two) times daily., Disp: 60 tablet, Rfl: 0   ALPRAZolam (XANAX) 1 MG tablet, Take 1 tablet (1 mg total) by mouth 3 (three) times daily., Disp: 90 tablet, Rfl: 5   loratadine (ALLERGY RELIEF) 10 MG tablet, Take 1 tablet (10 mg total) by mouth daily., Disp: 90 tablet, Rfl: 3   terbinafine (LAMISIL) 250 MG tablet, ONE TABLET BY MOUTH DAILY FOR UP TO 3 MONTHS DONT TAKE AT SAME TIME AS FLUCONAZOLE, Disp: 30 tablet, Rfl: 2   VENTOLIN HFA 108 (90 Base) MCG/ACT inhaler, INHALE 2 PUFFS INTO THE LUNGS EVERY 4 HOURS AS NEEDED FOR WHEEZING OR SHORTNESS OF BREATH, Disp: 8.5 g, Rfl: 2   Medications ordered in this encounter:  Meds ordered this encounter  Medications   doxycycline (VIBRA-TABS) 100 MG tablet    Sig: Take 1 tablet (100 mg total) by mouth 2 (two) times daily.    Dispense:  20 tablet    Refill:  0    Order Specific Question:   Supervising Provider    Answer:   Merrilee Jansky [[id number]]   guaiFENesin (MUCINEX) 600 MG 12 hr tablet    Sig: Take 1 tablet (600 mg total) by mouth 2 (two) times daily.     Dispense:  60 tablet    Refill:  0    Order Specific Question:   Supervising Provider    Answer:   Merrilee Jansky [id number]     *If you need refills on other medications prior to your next appointment, please contact your pharmacy*  Follow-Up: Call back or seek an in-person evaluation if the symptoms worsen or if the condition fails to improve as anticipated.  [facility] [phone]  Other Instructions  Acute Bronchitis, Adult  Acute bronchitis is when air tubes in the lungs (bronchi) suddenly get swollen. The condition can make it hard for you to breathe. In adults, acute bronchitis usually goes away within 2 weeks. A cough caused by bronchitis may last up to 3 weeks. Smoking, allergies, and asthma can make the condition worse. What are the causes? Germs that cause cold and flu (viruses). The most common cause of this condition is the virus that causes the common cold. Bacteria. Substances that bother (irritate) the lungs, including: Smoke from cigarettes and other types of tobacco. Dust and pollen. Fumes from chemicals, gases, or burned fuel. Indoor or outdoor air pollution. What increases the risk? A weak body's defense system. This is also called the immune system. Any condition that affects your lungs and breathing, such as asthma. What are the signs or symptoms? A  cough. Coughing up clear, yellow, or green mucus. Making high-pitched whistling sounds when you breathe, most often when you breathe out (wheezing). Runny or stuffy nose. Having too much mucus in your lungs (chest congestion). Shortness of breath. Body aches. A sore throat. How is this treated? Acute bronchitis may go away over time without treatment. Your doctor may tell you to: Drink more fluids. This will help thin your mucus so it is easier to cough up. Use a device that gets medicine into your lungs (inhaler). Use a vaporizer or a humidifier. These are machines that add water to the  air. This helps with coughing and poor breathing. Take a medicine that thins mucus and helps clear it from your lungs. Take a medicine that prevents or stops coughing. It is not common to take an antibiotic medicine for this condition. Follow these instructions at home:  Take over-the-counter and prescription medicines only as told by your doctor. Use an inhaler, vaporizer, or humidifier as told by your doctor. Take two teaspoons (10 mL) of honey at bedtime. This helps lessen your coughing at night. Drink enough fluid to keep your pee (urine) pale yellow. Do not smoke or use any products that contain nicotine or tobacco. If you need help quitting, ask your doctor. Get a lot of rest. Return to your normal activities when your doctor says that it is safe. Keep all follow-up visits. How is this prevented?  Wash your hands often with soap and water for at least 20 seconds. If you cannot use soap and water, use hand sanitizer. Avoid contact with people who have cold symptoms. Try not to touch your mouth, nose, or eyes with your hands. Avoid breathing in smoke or chemical fumes. Make sure to get the flu shot every year. Contact a doctor if: Your symptoms do not get better in 2 weeks. You have trouble coughing up the mucus. Your cough keeps you awake at night. You have a fever. Get help right away if: You cough up blood. You have chest pain. You have very bad shortness of breath. You faint or keep feeling like you are going to faint. You have a very bad headache. Your fever or chills get worse. These symptoms may be an emergency. Get help right away. Call your local emergency services (911 in the [place] Do not wait to see if the symptoms will go away. Do not drive yourself to the hospital. Summary Acute bronchitis is when air tubes in the lungs (bronchi) suddenly get swollen. In adults, acute bronchitis usually goes away within 2 weeks. Drink more fluids. This will help thin your mucus  so it is easier to cough up. Take over-the-counter and prescription medicines only as told by your doctor. Contact a doctor if your symptoms do not improve after 2 weeks of treatment. This information is not intended to replace advice given to you by your health care provider. Make sure you discuss any questions you have with your health care provider. Document Revised: 02/07/2021 Document Reviewed: 02/07/2021 Elsevier Patient Education  [place].   Sinus Infection, Adult A sinus infection is soreness and swelling (inflammation) of your sinuses. Sinuses are hollow spaces in the bones around your face. They are located: Around your eyes. In the middle of your forehead. Behind your nose. In your cheekbones. Your sinuses and nasal passages are lined with a fluid called mucus. Mucus drains out of your sinuses. Swelling can trap mucus in your sinuses. This lets germs (bacteria, virus, or fungus) grow,  which leads to infection. Most of the time, this condition is caused by a virus. What are the causes? Allergies. Asthma. Germs. Things that block your nose or sinuses. Growths in the nose (nasal polyps). Chemicals or irritants in the air. A fungus. This is rare. What increases the risk? Having a weak body defense system (immune system). Doing a lot of swimming or diving. Using nasal sprays too much. Smoking. What are the signs or symptoms? The main symptoms of this condition are pain and a feeling of pressure around the sinuses. Other symptoms include: Stuffy nose (congestion). This may make it hard to breathe through your nose. Runny nose (drainage). Soreness, swelling, and warmth in the sinuses. A cough that may get worse at night. Being unable to smell and taste. Mucus that collects in the throat or the back of the nose (postnasal drip). This may cause a sore throat or bad breath. Being very tired (fatigued). A fever. How is this diagnosed? Your symptoms. Your medical  history. A physical exam. Tests to find out if your condition is short-term (acute) or long-term (chronic). Your doctor may: Check your nose for growths (polyps). Check your sinuses using a tool that has a light on one end (endoscope). Check for allergies or germs. Do imaging tests, such as an MRI or CT scan. How is this treated? Treatment for this condition depends on the cause and whether it is short-term or long-term. If caused by a virus, your symptoms should go away on their own within 10 days. You may be given medicines to relieve symptoms. They include: Medicines that shrink swollen tissue in the nose. A spray that treats swelling of the nostrils. Rinses that help get rid of thick mucus in your nose (nasal saline washes). Medicines that treat allergies (antihistamines). Over-the-counter pain relievers. If caused by bacteria, your doctor may wait to see if you will get better without treatment. You may be given antibiotic medicine if you have: A very bad infection. A weak body defense system. If caused by growths in the nose, surgery may be needed. Follow these instructions at home: Medicines Take, use, or apply over-the-counter and prescription medicines only as told by your doctor. These may include nasal sprays. If you were prescribed an antibiotic medicine, take it as told by your doctor. Do not stop taking it even if you start to feel better. Hydrate and humidify  Drink enough water to keep your pee (urine) pale yellow. Use a cool mist humidifier to keep the humidity level in your home above 50%. Breathe in steam for 10-15 minutes, 3-4 times a day, or as told by your doctor. You can do this in the bathroom while a hot shower is running. Try not to spend time in cool or dry air. Rest Rest as much as you can. Sleep with your head raised (elevated). Make sure you get enough sleep each night. General instructions  Put a warm, moist washcloth on your face 3-4 times a day, or  as often as told by your doctor. Use nasal saline washes as often as told by your doctor. Wash your hands often with soap and water. If you cannot use soap and water, use hand sanitizer. Do not smoke. Avoid being around people who are smoking (secondhand smoke). Keep all follow-up visits. Contact a doctor if: You have a fever. Your symptoms get worse. Your symptoms do not get better within 10 days. Get help right away if: You have a very bad headache. You cannot stop vomiting.  You have very bad pain or swelling around your face or eyes. You have trouble seeing. You feel confused. Your neck is stiff. You have trouble breathing. These symptoms may be an emergency. Get help right away. Call 911. Do not wait to see if the symptoms will go away. Do not drive yourself to the hospital. Summary A sinus infection is swelling of your sinuses. Sinuses are hollow spaces in the bones around your face. This condition is caused by tissues in your nose that become inflamed or swollen. This traps germs. These can lead to infection. If you were prescribed an antibiotic medicine, take it as told by your doctor. Do not stop taking it even if you start to feel better. Keep all follow-up visits. This information is not intended to replace advice given to you by your health care provider. Make sure you discuss any questions you have with your health care provider. Document Revised: 09/11/2021 Document Reviewed: 09/11/2021 Elsevier Patient Education  2023 Elsevier Inc.    If you have been instructed to have an in-person evaluation today at a local Urgent Care facility, please use the link below. It will take you to a list of all of our available [facility], including address, phone number and hours of operation. Please do not delay care.  [facility]  If you or a family member do not have a primary care provider, use the link below to schedule a visit and establish care. When  you choose a [facility] primary care physician or advanced practice provider, you gain a long-term partner in health. Find a Primary Care Provider  Learn more about [facility] in-office and virtual care options: [facility] - Get Care Now

## 2022-08-15 NOTE — Telephone Encounter (Signed)
  Chief Complaint: URI Symptoms: Cough with green yellow and brown sputum Frequency: a few days Pertinent Negatives: Patient denies  Disposition: [] ED /[] Urgent Care (no appt availability in office) / [] Appointment(In office/virtual)/ [x]  [facility]/ [] Home Care/ [] Refused Recommended Disposition /[] [name]/ []  Follow-up with PCP Additional Notes: Pt states that he is coughing up a lot of sputum. HE states that he is filling a water bottle over night, and cannot lay flat.He has had a low grade fever off and on.   Hs of asthma.  Reason for Disposition  Coughing up rusty-colored (reddish-brown) sputum  Answer Assessment - Initial Assessment Questions 1. ONSET: "When did the cough begin?"      A couple days ago 2. SEVERITY: "How bad is the cough today?"       3. SPUTUM: "Describe the color of your sputum" (none, dry cough; clear, white, yellow, green)     Yellow and brown, green 4. HEMOPTYSIS: "Are you coughing up any blood?" If so ask: "How much?" (flecks, streaks, tablespoons, etc.)     no 5. DIFFICULTY BREATHING: "Are you having difficulty breathing?" If Yes, ask: "How bad is it?" (e.g., mild, moderate, severe)    - MILD: No SOB at rest, mild SOB with walking, speaks normally in sentences, can lie down, no retractions, pulse < 100.    - MODERATE: SOB at rest, SOB with minimal exertion and prefers to sit, cannot lie down flat, speaks in phrases, mild retractions, audible wheezing, pulse 100-120.    - SEVERE: Very SOB at rest, speaks in single words, struggling to breathe, sitting hunched forward, retractions, pulse > 120      Moderate 6. FEVER: "Do you have a fever?" If Yes, ask: "What is your temperature, how was it measured, and when did it start?"     No - low grade 7. CARDIAC HISTORY: "Do you have any history of heart disease?" (e.g., heart attack, congestive heart failure)      no 8. LUNG HISTORY: "Do you have any history of lung disease?"  (e.g.,  pulmonary embolus, asthma, emphysema)     asthma 9. PE RISK FACTORS: "Do you have a history of blood clots?" (or: recent major surgery, recent prolonged travel, bedridden)      10. OTHER SYMPTOMS: "Do you have any other symptoms?" (e.g., runny nose, wheezing, chest pain)       Sinus congestion 11. PREGNANCY: "Is there any chance you are pregnant?" "When was your last menstrual period?"        12. TRAVEL: "Have you traveled out of the country in the last month?" (e.g., travel history, exposures)  Protocols used: Cough - Acute Productive-A-AH

## 2022-08-15 NOTE — Telephone Encounter (Signed)
Requested Prescriptions  Pending Prescriptions Disp Refills  . VENTOLIN HFA 108 (90 Base) MCG/ACT inhaler [Pharmacy Med Name: VENTOLIN HFA 108 (90 BASE) MCG/ACT] 18 g 2    Sig: INHALE 2 PUFFS INTO THE LUNGS EVERY 4 HOURS AS NEEDED FOR WHEEZING OR SHORTNESS OF BREATH     Pulmonology:  Beta Agonists 2 Failed - 08/15/2022  1:14 PM      Failed - Last BP in normal range    BP Readings from Last 1 Encounters:  03/27/22 (!) 126/98         Passed - Last Heart Rate in normal range    Pulse Readings from Last 1 Encounters:  03/27/22 (!) 103         Passed - Valid encounter within last 12 months    Recent Outpatient Visits          4 months ago Annual physical exam   [place], DO   11 months ago Mild persistent asthma without complication   [place], DO   1 year ago Annual physical exam   [place], DO   1 year ago Mild intermittent asthma with exacerbation   [facility] [place], Coralie Keens, NP   1 year ago Onychomycosis of toenail   [place], Devonne Doughty, [place]

## 2022-08-15 NOTE — Telephone Encounter (Signed)
Message from [place] sent at 08/15/2022  3:39 PM EDT  Summary: Pt request an antibiotic   Pt stated he has a cough and some congestion possibly an upper respiratory infection and he would like to request an antibiotic. Pt asked that the Rx be sent to [facility]. Cb#  [phone]         Called pt and LM on VM to call back to discuss sx.Marland Kitchen

## 2022-08-15 NOTE — Progress Notes (Signed)
Virtual Visit Consent   Jeffery King, you are scheduled for a virtual visit with a [facility] provider today. Just as with appointments in the office, your consent must be obtained to participate. Your consent will be active for this visit and any virtual visit you may have with one of our providers in the next 365 days. If you have a MyChart account, a copy of this consent can be sent to you electronically.  As this is a virtual visit, video technology does not allow for your provider to perform a traditional examination. This may limit your provider's ability to fully assess your condition. If your provider identifies any concerns that need to be evaluated in person or the need to arrange testing (such as labs, EKG, etc.), we will make arrangements to do so. Although advances in technology are sophisticated, we cannot ensure that it will always work on either your end or our end. If the connection with a video visit is poor, the visit may have to be switched to a telephone visit. With either a video or telephone visit, we are not always able to ensure that we have a secure connection.  By engaging in this virtual visit, you consent to the provision of healthcare and authorize for your insurance to be billed (if applicable) for the services provided during this visit. Depending on your insurance coverage, you may receive a charge related to this service.  I need to obtain your verbal consent now. Are you willing to proceed with your visit today? Jeffery King has provided verbal consent on 08/15/2022 for a virtual visit (video or telephone). Mar Daring, PA-C  Date: 08/15/2022 5:39 PM  Virtual Visit via Video Note   I, Mar Daring, connected with  Jeffery King  ([id number], July 26, 1976) on 08/15/22 at  5:30 PM EDT by a video-enabled telemedicine application and verified that I am speaking with the correct person using two identifiers.  Location: Patient: Virtual Visit Location  Patient: Home Provider: Virtual Visit Location Provider: Home Office   I discussed the limitations of evaluation and management by telemedicine and the availability of in person appointments. The patient expressed understanding and agreed to proceed.    History of Present Illness: Jeffery King is a 46 y.o. who identifies as a male who was assigned male at birth, and is being seen today for URI symptoms.  HPI: URI  This is a new problem. The current episode started in the past 7 days. The problem has been gradually worsening. There has been no fever (low grade). Associated symptoms include chest pain (tightness and pressure), congestion, coughing, diarrhea, headaches, sinus pain, a sore throat (sore from coughing) and wheezing. Pertinent negatives include no ear pain, nausea, plugged ear sensation, rhinorrhea or vomiting. Associated symptoms comments: Post nasal drainage. He has tried nothing for the symptoms. The treatment provided no relief.     Problems:  Patient Active Problem List   Diagnosis Date Noted   Bilateral upper abdominal discomfort 09/17/2017   Environmental and seasonal allergies 02/20/2017   Vitamin D insufficiency 02/20/2017   Gastroesophageal reflux disease without esophagitis 08/01/2016   Anxiety 03/28/2016   Mild persistent asthma 03/28/2016   Degenerative disc disease, cervical 05/18/2014    Allergies:  Allergies  Allergen Reactions   Aspirin Shortness Of Breath and Swelling   Gabapentin Shortness Of Breath   Wellbutrin [Bupropion] Shortness Of Breath   Ibuprofen Swelling   Meloxicam Other (See Comments)   Naproxen Swelling  Lips   Medications:  Current Outpatient Medications:    doxycycline (VIBRA-TABS) 100 MG tablet, Take 1 tablet (100 mg total) by mouth 2 (two) times daily., Disp: 20 tablet, Rfl: 0   guaiFENesin (MUCINEX) 600 MG 12 hr tablet, Take 1 tablet (600 mg total) by mouth 2 (two) times daily., Disp: 60 tablet, Rfl: 0   ALPRAZolam (XANAX) 1 MG  tablet, Take 1 tablet (1 mg total) by mouth 3 (three) times daily., Disp: 90 tablet, Rfl: 5   loratadine (ALLERGY RELIEF) 10 MG tablet, Take 1 tablet (10 mg total) by mouth daily., Disp: 90 tablet, Rfl: 3   terbinafine (LAMISIL) 250 MG tablet, ONE TABLET BY MOUTH DAILY FOR UP TO 3 MONTHS DONT TAKE AT SAME TIME AS FLUCONAZOLE, Disp: 30 tablet, Rfl: 2   VENTOLIN HFA 108 (90 Base) MCG/ACT inhaler, INHALE 2 PUFFS INTO THE LUNGS EVERY 4 HOURS AS NEEDED FOR WHEEZING OR SHORTNESS OF BREATH, Disp: 8.5 g, Rfl: 2  Observations/Objective: Patient is well-developed, well-nourished in no acute distress.  Resting comfortably at home.  Head is normocephalic, atraumatic.  No labored breathing.  Speech is clear and coherent with logical content.  Patient is alert and oriented at baseline.    Assessment and Plan: 1. Sinobronchitis - doxycycline (VIBRA-TABS) 100 MG tablet; Take 1 tablet (100 mg total) by mouth 2 (two) times daily.  Dispense: 20 tablet; Refill: 0 - guaiFENesin (MUCINEX) 600 MG 12 hr tablet; Take 1 tablet (600 mg total) by mouth 2 (two) times daily.  Dispense: 60 tablet; Refill: 0  - Worsening symptoms that have not responded to OTC medications.  - Will give Doxycycline and Mucinex - Continue allergy medications.  - Steam and humidifier can help - Stay well hydrated and get plenty of rest.  - Seek in person evaluation if no symptom improvement or if symptoms worsen   Follow Up Instructions: I discussed the assessment and treatment plan with the patient. The patient was provided an opportunity to ask questions and all were answered. The patient agreed with the plan and demonstrated an understanding of the instructions.  A copy of instructions were sent to the patient via MyChart unless otherwise noted below.    The patient was advised to call back or seek an in-person evaluation if the symptoms worsen or if the condition fails to improve as anticipated.  Time:  I spent 11 minutes with  the patient via telehealth technology discussing the above problems/concerns.    Margaretann Loveless, PA-C

## 2022-08-19 ENCOUNTER — Other Ambulatory Visit: Payer: Self-pay

## 2022-08-19 DIAGNOSIS — J4530 Mild persistent asthma, uncomplicated: Secondary | ICD-10-CM

## 2022-08-19 MED ORDER — VENTOLIN HFA 108 (90 BASE) MCG/ACT IN AERS: 2.0000 | INHALATION_SPRAY | RESPIRATORY_TRACT | 2 refills | Status: DC | PRN

## 2022-09-26 ENCOUNTER — Other Ambulatory Visit: Payer: Self-pay | Admitting: Family Medicine

## 2022-09-26 DIAGNOSIS — F419 Anxiety disorder, unspecified: Secondary | ICD-10-CM

## 2022-09-27 NOTE — Telephone Encounter (Signed)
Requested medication (s) are due for refill today:no  Requested medication (s) are on the active medication list: yes Last refill:  03/27/22  Future visit scheduled: no  Notes to clinic:  Unable to refill per protocol, cannot delegate.      Requested Prescriptions  Pending Prescriptions Disp Refills   ALPRAZolam (XANAX) 1 MG tablet [Pharmacy Med Name: ALPRAZOLAM 1 MG TAB] 90 tablet     Sig: TAKE 1 TABLET BY MOUTH 3 TIMES DAILY.     Not Delegated - Psychiatry: Anxiolytics/Hypnotics 2 Failed - 09/26/2022  6:56 PM      Failed - This refill cannot be delegated      Failed - Urine Drug Screen completed in last 360 days      Failed - Valid encounter within last 6 months    Recent Outpatient Visits           6 months ago Annual physical exam   [facility] Smitty Cords, DO   1 year ago Mild persistent asthma without complication   [facility] Smitty Cords, DO   1 year ago Annual physical exam   [facility] Smitty Cords, DO   1 year ago Mild intermittent asthma with exacerbation   [facility] [place], Salvadore Oxford, NP   2 years ago Onychomycosis of toenail   [facility] [place], Netta Neat, [place] - Patient is not pregnant

## 2022-12-03 ENCOUNTER — Other Ambulatory Visit: Payer: Self-pay | Admitting: Internal Medicine

## 2022-12-03 DIAGNOSIS — F419 Anxiety disorder, unspecified: Secondary | ICD-10-CM

## 2022-12-04 NOTE — Telephone Encounter (Signed)
Requested medication (s) are due for refill today: yes  Requested medication (s) are on the active medication list: yes    Last refill: 09/27/22  #30  0 refills  Future visit scheduled no  Notes to clinic:Not delegated ,please review. Thank you.  Requested Prescriptions  Pending Prescriptions Disp Refills   ALPRAZolam (XANAX) 1 MG tablet [Pharmacy Med Name: ALPRAZOLAM 1 MG TAB] 30 tablet     Sig: TAKE 1 TABLET BY MOUTH 3 TIMES DAILY.     Not Delegated - Psychiatry: Anxiolytics/Hypnotics 2 Failed - 12/03/2022  3:02 PM      Failed - This refill cannot be delegated      Failed - Urine Drug Screen completed in last 360 days      Failed - Valid encounter within last 6 months    Recent Outpatient Visits           8 months ago Annual physical exam   [place], DO   1 year ago Mild persistent asthma without complication   [place], DO   1 year ago Annual physical exam   [place], DO   1 year ago Mild intermittent asthma with exacerbation   [facility], Coralie Keens, NP   2 years ago Onychomycosis of toenail   [place], [place], [place] - Patient is not pregnant

## 2023-01-02 ENCOUNTER — Ambulatory Visit (INDEPENDENT_AMBULATORY_CARE_PROVIDER_SITE_OTHER): Payer: Medicaid Other | Admitting: Family Medicine

## 2023-01-02 ENCOUNTER — Encounter: Payer: Self-pay | Admitting: Family Medicine

## 2023-01-02 VITALS — BP 186/122 | HR 92 | Ht 71.0 in | Wt 171.2 lb

## 2023-01-02 DIAGNOSIS — F419 Anxiety disorder, unspecified: Secondary | ICD-10-CM

## 2023-01-02 MED ORDER — LORAZEPAM 0.5 MG PO TABS: 0.5000 mg | ORAL_TABLET | Freq: Two times a day (BID) | ORAL | 1 refills | Status: DC | PRN

## 2023-01-02 NOTE — Patient Instructions (Addendum)
Thank you for coming to the office today.  Xanax 1 mg = Lorazepam 2 mg  Xanax 0.5 mg = Lorazepam 1 mg  Xanax 0.25 mg = Lorazepam 0.5 mg  ------------------------  2 weeks at a time  Then can gradually taper by half tab dose for one of the doses  Can do whole in AM and half in PM then eventually HALF for both doses   These offices have both [place] doctors and IT trainer Diplomatic Services operational officer Available) [place] [address] [place], [place] [address] Phone: [phone]  [facility] Address: [address], [place], [place] [address] [email or web address] Phone: [phone]  [facility] ([place] [facility]) Address: [address], [place], [place] [address] Hours: 8:30AM-5PM Phone: [phone]  [place] (Adult, Major, Geriatric, Counseling) [address] [place], [place] [address] Phone: [phone] Fax: [phone]  [place] [facility] [place], [place] [address] Phone: [phone]  [facility] (All ages) [address] [place], [id number] Phone: [phone] (Option 1) [email or web address]  ----------------------------------------------------------------- THERAPIST ONLY  (No Psychiatry)  [facility] [address], [place] [address] [place] P: [phone]  [facility]) [facility], [facility] [address] [place], [place] [address] [phone]  [place].   Address: [address] [place], [place] [address] Hours: Open today  9AM-7PM Phone: [phone]  [facility] [address] [place] Address: [address] [place], [place],  [address] Phone: [phone]  [facility] [place],  [address] Phone:  [phone]    Please schedule a Follow-up Appointment to: Return in about 4 weeks (around 01/30/2023) for 4 weeks anxiety med management.  If you have any other questions or concerns, please feel free to call the office or send a message through [place]. You may also schedule an earlier appointment if necessary.  Additionally, you may be receiving a survey about your experience at our office within a few days to 1 week by e-mail or mail. We value your feedback.  [name], DO [place]

## 2023-01-02 NOTE — Progress Notes (Signed)
Subjective:    Patient ID: Jeffery King, male    DOB: 02/23/1976, 47 y.o.   MRN: [id number]  Jeffery King is a 47 y.o. male presenting on 01/02/2023 for Anxiety   HPI  Anxiety He has been managing his anxiety and overall has improved his in general. He continues on Xanax '1mg'$  TID - still trying to wean gradually occasional reduce dose, but currently maintains dose  He was tired of being on Xanax and goal was to come off of it.  Recently 2 weeks of cold [place] discontinued Xanax '1mg'$  THREE TIMES A DAY, he had variety side effects with muscle tension spasm anxiety and worsening.  Now improving  Taking now half tab Xanax of '1mg'$ , = 0.'5mg'$  TWICE A DAY       01/02/2023   11:12 AM 03/27/2022    9:11 AM 09/18/2021   10:50 AM  Depression screen PHQ 2/9  Decreased Interest '1 2 2  '$ Down, Depressed, Hopeless 0 0 0  PHQ - 2 Score '1 2 2  '$ Altered sleeping 1 0 0  Tired, decreased energy '1 1 1  '$ Change in appetite '1 1 1  '$ Feeling bad or failure about yourself  1 0 0  Trouble concentrating 2 0 0  Moving slowly or fidgety/restless 1 0 0  Suicidal thoughts 0 0 0  PHQ-9 Score '8 4 4  '$ Difficult doing work/chores Somewhat difficult Not difficult at all Not difficult at all      01/02/2023   11:13 AM 03/27/2022    9:11 AM 09/18/2021   10:51 AM 03/20/2021    9:32 AM  GAD 7 : Generalized Anxiety Score  Nervous, Anxious, on Edge 2 0 0 0  Control/stop worrying 2 0 0 0  Worry too much - different things 2 0 0 0  Trouble relaxing 2 0 0 0  Restless '2 1 1 '$ 0  Easily annoyed or irritable '2 1 1 '$ 0  Afraid - awful might happen 2 0 0 0  Total GAD 7 Score '14 2 2 '$ 0  Anxiety Difficulty Somewhat difficult Not difficult at all Not difficult at all Not difficult at all      Social History   Tobacco Use   Smoking status: Former    Types: Cigarettes    Quit date: 05/30/2014    Years since quitting: 8.6   Smokeless tobacco: Former  Scientific laboratory technician Use: Never used  Substance Use Topics   Alcohol use:  No   Drug use: No    Review of Systems Per HPI unless specifically indicated above     Objective:    BP (!) 186/122   Pulse 92   Ht '5\' 11"'$  (1.803 m)   Wt 171 lb 3.2 oz (77.7 kg)   SpO2 100%   BMI 23.88 kg/m   Wt Readings from Last 3 Encounters:  01/02/23 171 lb 3.2 oz (77.7 kg)  03/27/22 180 lb 9.6 oz (81.9 kg)  09/18/21 192 lb 3.2 oz (87.2 kg)    Physical Exam Vitals and nursing note reviewed.  Constitutional:      General: He is not in acute distress.    Appearance: Normal appearance. He is well-developed. He is not diaphoretic.     Comments: Well-appearing, comfortable, cooperative  HENT:     Head: Normocephalic and atraumatic.  Eyes:     General:        Right eye: No discharge.        Left eye: No discharge.  Conjunctiva/sclera: Conjunctivae normal.  Cardiovascular:     Rate and Rhythm: Normal rate.  Pulmonary:     Effort: Pulmonary effort is normal.  Skin:    General: Skin is warm and dry.     Findings: No erythema or rash.  Neurological:     Mental Status: He is alert and oriented to person, place, and time.  Psychiatric:        Mood and Affect: Mood normal.        Behavior: Behavior normal.        Thought Content: Thought content normal.     Comments: Well groomed, good eye contact, normal speech and thoughts, anxious    Results for orders placed or performed in visit on 03/15/21  VITAMIN D 25 Hydroxy (Vit-D Deficiency, Fractures)  Result Value Ref Range   Vit D, 25-Hydroxy 31 30 - 100 ng/mL  PSA  Result Value Ref Range   PSA 0.88 < OR = 4.00 ng/mL  Lipid panel  Result Value Ref Range   Cholesterol 173 <200 mg/dL   HDL 50 > OR = 40 mg/dL   Triglycerides 173 (H) <150 mg/dL   LDL Cholesterol (Calc) 96 mg/dL (calc)   Total CHOL/HDL Ratio 3.5 <5.0 (calc)   Non-HDL Cholesterol (Calc) 123 <130 mg/dL (calc)  COMPLETE METABOLIC PANEL WITH GFR  Result Value Ref Range   Glucose, Bld 84 65 - 99 mg/dL   BUN 11 7 - 25 mg/dL   Creat 1.15 0.60 - 1.35  mg/dL   GFR, Est Non African American 76 > OR = 60 mL/min/1.26m   GFR, Est African American 89 > OR = 60 mL/min/1.755m  BUN/Creatinine Ratio NOT APPLICABLE 6 - 22 (calc)   Sodium 141 135 - 146 mmol/L   Potassium 4.4 3.5 - 5.3 mmol/L   Chloride 104 98 - 110 mmol/L   CO2 29 20 - 32 mmol/L   Calcium 9.5 8.6 - 10.3 mg/dL   Total Protein 7.9 6.1 - 8.1 g/dL   Albumin 4.3 3.6 - 5.1 g/dL   Globulin 3.6 1.9 - 3.7 g/dL (calc)   AG Ratio 1.2 1.0 - 2.5 (calc)   Total Bilirubin 0.5 0.2 - 1.2 mg/dL   Alkaline phosphatase (APISO) 93 36 - 130 U/L   AST 27 10 - 40 U/L   ALT 30 9 - 46 U/L  CBC with Differential/Platelet  Result Value Ref Range   WBC 4.7 3.8 - 10.8 Thousand/uL   RBC 5.54 4.20 - 5.80 Million/uL   Hemoglobin 17.0 13.2 - 17.1 g/dL   HCT 51.3 (H) 38.5 - 50.0 %   MCV 92.6 80.0 - 100.0 fL   MCH 30.7 27.0 - 33.0 pg   MCHC 33.1 32.0 - 36.0 g/dL   RDW 12.9 11.0 - 15.0 %   Platelets 216 140 - 400 Thousand/uL   MPV 10.6 7.5 - 12.5 fL   Neutro Abs 2,416 1,500 - 7,800 cells/uL   Lymphs Abs 1,476 850 - 3,900 cells/uL   Absolute Monocytes 780 200 - 950 cells/uL   Eosinophils Absolute 9 (L) 15 - 500 cells/uL   Basophils Absolute 19 0 - 200 cells/uL   Neutrophils Relative % 51.4 %   Total Lymphocyte 31.4 %   Monocytes Relative 16.6 %   Eosinophils Relative 0.2 %   Basophils Relative 0.4 %  Hemoglobin A1c  Result Value Ref Range   Hgb A1c MFr Bld 5.2 <5.7 % of total Hgb   Mean Plasma Glucose 103 mg/dL   eAG (  mmol/L) 5.7 mmol/L      Assessment & Plan:   Problem List Items Addressed This Visit     Anxiety - Primary   Relevant Medications   LORazepam (ATIVAN) 0.5 MG tablet    Meds ordered this encounter  Medications   LORazepam (ATIVAN) 0.5 MG tablet    Sig: Take 1 tablet (0.5 mg total) by mouth 2 (two) times daily as needed for anxiety.    Dispense:  60 tablet    Refill:  1   Anxiety, generalized, panic attacks Chronic BDZ use  Reviewed past discussion on this topic. He  has had some withdrawal side effects when he discontinued Xanax '1mg'$  THREE TIMES A DAY dosing cold [place].  Now back on Xanax 0.'5mg'$  twice a day  We discussed that he will need longer course to taper and also we can switch to longer acting medication.  Will trial Lorazepam 0.'5mg'$  rx, which is lowest tier, more comparable to Xanax 0.'25mg'$  which is already dose reduced  He should take 1 tab Lorazepam 0.'5mg'$  twice a day for 2 weeks, then can go to HALF tab = 0.'25mg'$  dose in AM or PM, and the other dose is full. Then 2+ more weeks he can go to half tab TWICE A DAY. He may continue this for period of time several weeks if need and eventually we would drop to daily dose. He can follow up if needed  No orders of the defined types were placed in this encounter.     Follow up plan: Return in about 4 weeks (around 01/30/2023) for 4 weeks anxiety med management.   [name], [facility] 01/02/2023, 11:44 AM

## 2023-01-03 ENCOUNTER — Ambulatory Visit: Payer: Self-pay

## 2023-01-03 ENCOUNTER — Telehealth: Payer: Self-pay | Admitting: Family Medicine

## 2023-01-03 DIAGNOSIS — F419 Anxiety disorder, unspecified: Secondary | ICD-10-CM

## 2023-01-03 NOTE — Telephone Encounter (Signed)
Referral Request - Has patient seen PCP for this complaint? yes *If NO, is insurance requiring patient see PCP for this issue before PCP can refer them? Referral for which specialty: therapist for anxiety Preferred provider/office: ? Reason for referral: wants to get weaned off of xanax and try therapy instead /also request it be telephone visits because can't come in office

## 2023-01-03 NOTE — Telephone Encounter (Signed)
  Chief Complaint: medication assistance Symptoms: took lorazepam last night and Felt good last night and had increased appetite but then this morning was nauseous and feels cloudy headed Frequency: yesterday and today Pertinent Negatives: NA Disposition: [] ED /[] Urgent Care (no appt availability in office) / [] Appointment(In office/virtual)/ []  [facility]/ [] Home Care/ [] Refused Recommended Disposition /[]  Mobile Bus/ [x]  Follow-up with PCP Additional Notes: pt is concerned and would like to discuss with Dr. Raliegh Ip how he is feeling and make sure he is making right decision switching medications and won't have protracted withdrawals. Pt states that he is also needing to know if there is someone he can talk to 24/7 if he has withdrawals or what he is needing to watch out for SE related. Gave pt [facility] # to reach out to see if they do appts only or 24/7 and also advised him of [facility] and phone # since they are 24/7 for sure. Pt verbalized understanding and advised him I would send message back and have nurse FU with him when coming back from lunch.   Summary: med ?   Patient called in has  questions about lorazapam and xanax.         Reason for Disposition  [1] Caller has URGENT medicine question about med that PCP or specialist prescribed AND [2] triager unable to answer question  Answer Assessment - Initial Assessment Questions 1. NAME of MEDICINE: "What medicine(s) are you calling about?"     Xanax and lorazepam  2. QUESTION: "What is your question?" (e.g., double dose of medicine, side effect)     Has some feeling this morning and wanted to discuss with Dr. Raliegh Ip  3. PRESCRIBER: "Who prescribed the medicine?" Reason: if prescribed by specialist, call should be referred to that group.     Dr. Raliegh Ip 4. SYMPTOMS: "Do you have any symptoms?" If Yes, ask: "What symptoms are you having?"  "How bad are the symptoms (e.g., mild, moderate, severe)     Felt good last  night and had increased appetite but then this morning was nauseous and feels cloudy headed  Protocols used: Medication Question Call-A-AH

## 2023-01-06 NOTE — Telephone Encounter (Signed)
Duplicate message.  Here is my previous response earlier today  Called him back. His questions were answered on 3/15, he actually feels a bit better and prefers to avoid referral at this time. He still has some foggy feeling on Lorazepam, but not having withdrawal issues. He will schedule f/u with me in 3-4 weeks to discuss further.   [name], [facility] 01/06/2023, 9:50 AM

## 2023-01-06 NOTE — Telephone Encounter (Signed)
Called him back. His questions were answered on 3/15, he actually feels a bit better and prefers to avoid referral at this time. He still has some foggy feeling on Lorazepam, but not having withdrawal issues. He will schedule f/u with me in 3-4 weeks to discuss further.  [name], [facility] 01/06/2023, 9:50 AM

## 2023-01-06 NOTE — Telephone Encounter (Signed)
Sent to me in error. Will redirect to PCP.

## 2023-01-06 NOTE — Addendum Note (Signed)
Addended by: Olin Hauser on: 01/06/2023 09:50 AM   Modules accepted: Orders

## 2023-02-18 ENCOUNTER — Telehealth: Payer: Self-pay | Admitting: Family Medicine

## 2023-02-18 DIAGNOSIS — F419 Anxiety disorder, unspecified: Secondary | ICD-10-CM

## 2023-02-18 NOTE — Telephone Encounter (Signed)
Please notify him of the following  There is no lower dose for Lorazepam. 0.5mg  is lowest pill version.  Last visit March 2024, we discussed tapering instructions.  Can you clarify when the message says he is taking HALF pill of Lorazepam 0.5mg , is this HALF pill once per day or is it half pill TWICE per day?  I need more information to answer his question.  If he is on half pill once a day, that is about as low as you can get, the goal is to keep on that minimal dose for several weeks and then eventually phase down to either quarter pill or to every other day and then off.  If he is on half pill twice a day, then he would next consider drop to half pill once a day.  Let me know if further questions.  [name], DO [facility] 02/18/2023, 5:20 PM

## 2023-02-18 NOTE — Telephone Encounter (Signed)
Pt is calling in because he says he is currently on LORazepam (ATIVAN) 0.5 MG tablet [[id number]] and he says he is taking a half of pill currently and wanted to discuss getting a lower dosage if possible with Dr. Althea Charon.

## 2023-02-19 NOTE — Addendum Note (Signed)
Addended by: Judd Gaudier on: 02/19/2023 09:29 AM   Modules accepted: Orders

## 2023-02-19 NOTE — Telephone Encounter (Signed)
Patient is currently taking half a pill twice a day.  He says the pills don't break well.  He has noticed some chest tightness since he has cut back on this medication.  He needs an updated rx for this medication.  He is also scheduled for follow up.  Patient aware provider is out of the office today and will address at his return.

## 2023-02-20 MED ORDER — LORAZEPAM 0.5 MG PO TABS: 0.2500 mg | ORAL_TABLET | Freq: Every day | ORAL | 1 refills | Status: DC | PRN

## 2023-02-20 NOTE — Telephone Encounter (Signed)
Signed orders for new rx Lorazepam 0.5mg  - same dosage, but instruction for half tab daily as needed.  [name], DO [facility] 02/20/2023, 12:31 PM

## 2023-02-20 NOTE — Addendum Note (Signed)
Addended by: Smitty Cords on: 02/20/2023 12:31 PM   Modules accepted: Orders

## 2023-03-07 ENCOUNTER — Ambulatory Visit: Payer: Medicaid Other | Admitting: Family Medicine

## 2023-03-20 ENCOUNTER — Encounter: Payer: Self-pay | Admitting: Family Medicine

## 2023-03-20 ENCOUNTER — Ambulatory Visit: Payer: Medicaid Other | Admitting: Family Medicine

## 2023-03-20 VITALS — BP 122/82 | HR 86 | Ht 71.0 in | Wt 179.0 lb

## 2023-03-20 DIAGNOSIS — H6123 Impacted cerumen, bilateral: Secondary | ICD-10-CM | POA: Diagnosis not present

## 2023-03-20 DIAGNOSIS — F419 Anxiety disorder, unspecified: Secondary | ICD-10-CM

## 2023-03-20 DIAGNOSIS — H9313 Tinnitus, bilateral: Secondary | ICD-10-CM

## 2023-03-20 NOTE — Patient Instructions (Addendum)
Thank you for coming to the office today.  Okay to proceed to discontinue the Lorazepam entirely within 1-2 weeks. Entirely stop and see how you feel overall.  You do have 1 refill available at pharmacy for the Lorazepam, you can pick it up and save it for future if need.   DUE for FASTING BLOOD WORK (no food or drink after midnight before the lab appointment, only water or coffee without cream/sugar on the morning of)  SCHEDULE "Lab Only" visit in the morning at the clinic for lab draw in 3 MONTHS   - Make sure Lab Only appointment is at about 1 week before your next appointment, so that results will be available  For Lab Results, once available within 2-3 days of blood draw, you can can log in to MyChart online to view your results and a brief explanation. Also, we can discuss results at next follow-up visit.    Please schedule a Follow-up Appointment to: Return in about 3 months (around 06/20/2023) for 3 month fasting lab only then 1 week later Annual Physical (morning preferred).  If you have any other questions or concerns, please feel free to call the office or send a message through MyChart. You may also schedule an earlier appointment if necessary.  Additionally, you may be receiving a survey about your experience at our office within a few days to 1 week by e-mail or mail. We value your feedback.  [name], DO [facility], [place]

## 2023-03-20 NOTE — Progress Notes (Signed)
Subjective:    Patient ID: Jeffery King, male    DOB: 1976/09/03, 47 y.o.   MRN: [id number]  Jeffery King is a 47 y.o. male presenting on 03/20/2023 for Anxiety   HPI  Anxiety Significantly improved over course of past several weeks with taper down on anxiety medication. Currently he has reduced dose down to Lorazepam 0.5mg  dosage, now taking half of half. = 0.125 mg per day, usually 5 am. Recently he has gone several hours or more longer without issue and occasional will take the dose later if misses AM dose. He is ready to discontinue soon now. Not experiencing withdrawal or concerns. He has several pills left about 4-5 whole tab and then half tab x 1-2 .  Ear Wax Pressure Bilateral ear ringing Some discomfort and pressure with bilateral ears R>L with ear wax, has been years since flushed     03/20/2023    9:40 AM 01/02/2023   11:12 AM 03/27/2022    9:11 AM  Depression screen PHQ 2/9  Decreased Interest 2 1 2   Down, Depressed, Hopeless 2 0 0  PHQ - 2 Score 4 1 2   Altered sleeping 2 1 0  Tired, decreased energy 2 1 1   Change in appetite 1 1 1   Feeling bad or failure about yourself  1 1 0  Trouble concentrating 1 2 0  Moving slowly or fidgety/restless 0 1 0  Suicidal thoughts 0 0 0  PHQ-9 Score 11 8 4   Difficult doing work/chores  Somewhat difficult Not difficult at all      03/20/2023    9:40 AM 01/02/2023   11:13 AM 03/27/2022    9:11 AM 09/18/2021   10:51 AM  GAD 7 : Generalized Anxiety Score  Nervous, Anxious, on Edge 1 2 0 0  Control/stop worrying 1 2 0 0  Worry too much - different things 1 2 0 0  Trouble relaxing 3 2 0 0  Restless 1 2 1 1   Easily annoyed or irritable 2 2 1 1   Afraid - awful might happen 2 2 0 0  Total GAD 7 Score 11 14 2 2   Anxiety Difficulty  Somewhat difficult Not difficult at all Not difficult at all      Social History   Tobacco Use   Smoking status: Former    Types: Cigarettes    Quit date: 05/30/2014    Years since quitting: 8.8    Smokeless tobacco: Former  Building services engineer Use: Never used  Substance Use Topics   Alcohol use: No   Drug use: No    Review of Systems Per HPI unless specifically indicated above     Objective:    BP 122/82   Pulse 86   Ht 5\' 11"  (1.803 m)   Wt 179 lb (81.2 kg)   SpO2 98%   BMI 24.97 kg/m   Wt Readings from Last 3 Encounters:  03/20/23 179 lb (81.2 kg)  01/02/23 171 lb 3.2 oz (77.7 kg)  03/27/22 180 lb 9.6 oz (81.9 kg)    Physical Exam Vitals and nursing note reviewed.  Constitutional:      General: He is not in acute distress.    Appearance: Normal appearance. He is well-developed. He is not diaphoretic.     Comments: Well-appearing, comfortable, cooperative  HENT:     Head: Normocephalic and atraumatic.     Right Ear: There is impacted cerumen.     Left Ear: There is impacted cerumen.  Eyes:     General:        Right eye: No discharge.        Left eye: No discharge.     Conjunctiva/sclera: Conjunctivae normal.  Cardiovascular:     Rate and Rhythm: Normal rate.  Pulmonary:     Effort: Pulmonary effort is normal.  Skin:    General: Skin is warm and dry.     Findings: No erythema or rash.  Neurological:     Mental Status: He is alert and oriented to person, place, and time.  Psychiatric:        Mood and Affect: Mood normal.        Behavior: Behavior normal.        Thought Content: Thought content normal.     Comments: Well groomed, good eye contact, normal speech and thoughts    ________________________________________________________ PROCEDURE NOTE Date: 03/20/23 Bilateral Ear Lavage / Cerumen Removal Discussed benefits and risks (including pain / discomforts, dizziness, minor abrasion of ear canal). Verbal consent given by patient. Medication:  carbamide peroxide ear drops, Ear Lavage Solution (warm water + hydrogen peroxide) Performed by Dr Althea Charon and Darrol Angel CMA Several drops of carbamide peroxide placed in each ear, allowed to sit  for few minutes. Ear lavage solution flushed into one ear at a time in attempt to dislodge and remove ear wax. Results successful.  Repeat Ear Exam: - Completely removed cerumen now, with clear ear canals and visible TMs clear and normal appearance.  R side ear canal with mild area of abrasion superficial raw area where wax was impacted.   Results for orders placed or performed in visit on 03/15/21  VITAMIN D 25 Hydroxy (Vit-D Deficiency, Fractures)  Result Value Ref Range   Vit D, 25-Hydroxy 31 30 - 100 ng/mL  PSA  Result Value Ref Range   PSA 0.88 < OR = 4.00 ng/mL  Lipid panel  Result Value Ref Range   Cholesterol 173 <200 mg/dL   HDL 50 > OR = 40 mg/dL   Triglycerides 914 (H) <150 mg/dL   LDL Cholesterol (Calc) 96 mg/dL (calc)   Total CHOL/HDL Ratio 3.5 <5.0 (calc)   Non-HDL Cholesterol (Calc) 123 <130 mg/dL (calc)  COMPLETE METABOLIC PANEL WITH GFR  Result Value Ref Range   Glucose, Bld 84 65 - 99 mg/dL   BUN 11 7 - 25 mg/dL   Creat 7.82 9.56 - 2.13 mg/dL   GFR, Est Non African American 76 > OR = 60 mL/min/1.81m2   GFR, Est African American 89 > OR = 60 mL/min/1.69m2   BUN/Creatinine Ratio NOT APPLICABLE 6 - 22 (calc)   Sodium 141 135 - 146 mmol/L   Potassium 4.4 3.5 - 5.3 mmol/L   Chloride 104 98 - 110 mmol/L   CO2 29 20 - 32 mmol/L   Calcium 9.5 8.6 - 10.3 mg/dL   Total Protein 7.9 6.1 - 8.1 g/dL   Albumin 4.3 3.6 - 5.1 g/dL   Globulin 3.6 1.9 - 3.7 g/dL (calc)   AG Ratio 1.2 1.0 - 2.5 (calc)   Total Bilirubin 0.5 0.2 - 1.2 mg/dL   Alkaline phosphatase (APISO) 93 36 - 130 U/L   AST 27 10 - 40 U/L   ALT 30 9 - 46 U/L  CBC with Differential/Platelet  Result Value Ref Range   WBC 4.7 3.8 - 10.8 Thousand/uL   RBC 5.54 4.20 - 5.80 Million/uL   Hemoglobin 17.0 13.2 - 17.1 g/dL   HCT 08.6 (H) 57.8 -  50.0 %   MCV 92.6 80.0 - 100.0 fL   MCH 30.7 27.0 - 33.0 pg   MCHC 33.1 32.0 - 36.0 g/dL   RDW 16.1 09.6 - 04.5 %   Platelets 216 140 - 400 Thousand/uL   MPV 10.6 7.5  - 12.5 fL   Neutro Abs 2,416 1,500 - 7,800 cells/uL   Lymphs Abs 1,476 850 - 3,900 cells/uL   Absolute Monocytes 780 200 - 950 cells/uL   Eosinophils Absolute 9 (L) 15 - 500 cells/uL   Basophils Absolute 19 0 - 200 cells/uL   Neutrophils Relative % 51.4 %   Total Lymphocyte 31.4 %   Monocytes Relative 16.6 %   Eosinophils Relative 0.2 %   Basophils Relative 0.4 %  Hemoglobin A1c  Result Value Ref Range   Hgb A1c MFr Bld 5.2 <5.7 % of total Hgb   Mean Plasma Glucose 103 mg/dL   eAG (mmol/L) 5.7 mmol/L      Assessment & Plan:   Problem List Items Addressed This Visit     Anxiety - Primary   Other Visit Diagnoses     Bilateral impacted cerumen       Tinnitus of both ears          Anxiety, generalized, panic attacks Chronic BDZ use Reviewed past discussion on this topic. Prior mild withdrawal side effects Now improved on very low dose has tapered down successfully He is on Lorazepam 0.125mg  daily now. Ready to discontinue May continue 0.125mg  daily for another 1-2 weeks or sooner when ready may discontinue and monitor. If need occasional dose that is okay, he has enough pills to continue for short period of time. I am willing to re order short term small amount if needed.  Cerumen Impaction Likely cause of reducing hearing / tinnitus Resolved today s/p ear lavage flushing in office. Tolerated well  No orders of the defined types were placed in this encounter.   Follow up plan: Return in about 3 months (around 06/20/2023) for 3 month fasting lab only then 1 week later Annual Physical (morning preferred).  Future labs to be ordered.  [name], DO [facility] 03/20/2023, 9:53 AM

## 2023-05-08 ENCOUNTER — Ambulatory Visit: Payer: Self-pay | Admitting: *Deleted

## 2023-05-08 NOTE — Telephone Encounter (Signed)
    Chief Complaint: I feel like I'm drowning and have a  constant heaviness in my chest.   Symptoms: I can't lay down because I can't breath.  I also am lightheaded and feel like I'm in a fog.   Breathing in cold air from the freezer makes my breathing easier and my symptoms go away.    Frequency: This started day before yesterday but has gotten much worse yesterday and through today. Pertinent Negatives: Patient denies chest congestion, coughing, fever, sore throat or runny nose. Disposition: [x] ED /[] Urgent Care (no appt availability in office) / [] Appointment(In office/virtual)/ []  [facility]/ [] Home Care/ [] Refused Recommended Disposition /[] [place] [facility]/ []  Follow-up with PCP Additional Notes: I have referred him to the ED.   He is agreeable to going.   He has 3 kids at home so he is making arrangements for them and then will go to the ED.    I have sent this information to Dr. Althea Charon.

## 2023-05-08 NOTE — Telephone Encounter (Signed)
Reason for Disposition . Patient sounds very sick or weak to the triager    Chest heaviness and not being able to lay down.  Feeling like he is drowning.  Short of breath with little exertion.  Answer Assessment - Initial Assessment Questions 1. RESPIRATORY STATUS: "Describe your breathing?" (e.g., wheezing, shortness of breath, unable to speak, severe coughing)      I feel short of breath.     Started yesterday or day before yesterday.   Today my chest feels heavy.   I feel like I'm drowning like I have fluid in my lungs or something.   I can't lay down at all and breath.   When I'm sitting after just a few minutes I have to get up and just go out and get some air or something because I feel like I'm drowning.    Then I go stick my head in my freezer to breath in cold air.   I feel flushed.   When I breath in the cold air from the freezer my symptoms seem to go away.   I don't know what is going on with me. I just got off Xanax a couple of months ago   I had bad withdrawals.   I'm wondering if this is part of the withdrawal from Xanax?   I feel like I am downing.   I'm wondering if this is anxiety or what is going on with me. 2. ONSET: "When did this breathing problem begin?"      Day before yesterday.   My chest is tight and heavy.   My throat feels swollen.  The sides of my neck feels tight.   I'm light headed and feel like I'm in a dream state.   I wonder if it's anxiety.     I was able to go outside and walk and feel better.    I don't smoke or vape.       3. PATTERN "Does the difficult breathing come and go, or has it been constant since it started?"      Intermittent symptoms except the chest heaviness is all the time now.   My throat feels tight.    4. SEVERITY: "How bad is your breathing?" (e.g., mild, moderate, severe)    - MILD: No SOB at rest, mild SOB with walking, speaks normally in sentences, can lie down, no retractions, pulse < 100.    - MODERATE: SOB at rest, SOB with minimal  exertion and prefers to sit, cannot lie down flat, speaks in phrases, mild retractions, audible wheezing, pulse 100-120.    - SEVERE: Very SOB at rest, speaks in single words, struggling to breathe, sitting hunched forward, retractions, pulse > 120      I feel like I'm drowning.   I have a very little wheezing.  I just get short of breath so easy.   Denies coughing, chest congestion, no runny nose or sore throat.   Just tight in my chest and "like I'm drowning". 5. RECURRENT SYMPTOM: "Have you had difficulty breathing before?" If Yes, ask: "When was the last time?" and "What happened that time?"      No 6. CARDIAC HISTORY: "Do you have any history of heart disease?" (e.g., heart attack, angina, bypass surgery, angioplasty)      No  7. LUNG HISTORY: "Do you have any history of lung disease?"  (e.g., pulmonary embolus, asthma, emphysema)     No 8. CAUSE: "What do you think is causing the  breathing problem?"      Is it anxiety?    If I lay back I have to sit up.  I can't lay down at all because I can't breath.    I wonder if I have fluid in my lungs.     9. OTHER SYMPTOMS: "Do you have any other symptoms? (e.g., dizziness, runny nose, cough, chest pain, fever)     Lightheaded and like I'm in a fog.   I'm dizzy.   When I take a deep breath I feel like my chest and head and throat is pressure and I'm about to blow up.  I can't lay down.   Even sitting down I have to get up and get air to get rid of the feeling of drowning.    It hurts in my upper chest and throat.   F I stand up straight I don't have pain but when I bend over I have pain with a deep breath.  When I lean over while sitting and have my elbows on the table and lean on my arms I feel my chest is tight and like I'm drowning.  I have a flat tire so I can't come in tomorrow.    I've got to get that fixed.  10. O2 SATURATION MONITOR:  "Do you use an oxygen saturation monitor (pulse oximeter) at home?" If Yes, ask: "What is your reading (oxygen  level) today?" "What is your usual oxygen saturation reading?" (e.g., 95%)       N/A 11. PREGNANCY: "Is there any chance you are pregnant?" "When was your last menstrual period?"       N/A 12. TRAVEL: "Have you traveled out of the country in the last month?" (e.g., travel history, exposures)       N/A  Protocols used: Breathing Difficulty-A-AH

## 2023-05-09 ENCOUNTER — Emergency Department
Admission: EM | Admit: 2023-05-09 | Discharge: 2023-05-09 | Disposition: A | Discharge status: Home / Self Care | Payer: Medicaid Other | Attending: Emergency Medicine | Admitting: Emergency Medicine

## 2023-05-09 ENCOUNTER — Emergency Department: Payer: Medicaid Other

## 2023-05-09 ENCOUNTER — Other Ambulatory Visit: Payer: Self-pay

## 2023-05-09 DIAGNOSIS — J45909 Unspecified asthma, uncomplicated: Secondary | ICD-10-CM | POA: Diagnosis not present

## 2023-05-09 DIAGNOSIS — R0602 Shortness of breath: Secondary | ICD-10-CM | POA: Insufficient documentation

## 2023-05-09 DIAGNOSIS — R079 Chest pain, unspecified: Secondary | ICD-10-CM | POA: Diagnosis not present

## 2023-05-09 DIAGNOSIS — F419 Anxiety disorder, unspecified: Secondary | ICD-10-CM | POA: Diagnosis not present

## 2023-05-09 LAB — CBC
HCT: 46.8 % (ref 39.0–52.0)
Hemoglobin: 16 g/dL (ref 13.0–17.0)
MCH: 30.4 pg (ref 26.0–34.0)
MCHC: 34.2 g/dL (ref 30.0–36.0)
MCV: 88.8 fL (ref 80.0–100.0)
Platelets: 192 10*3/uL (ref 150–400)
RBC: 5.27 MIL/uL (ref 4.22–5.81)
RDW: 12.6 % (ref 11.5–15.5)
WBC: 6.7 10*3/uL (ref 4.0–10.5)
nRBC: 0 % (ref 0.0–0.2)

## 2023-05-09 LAB — BASIC METABOLIC PANEL
Anion gap: 12 (ref 5–15)
BUN: 18 mg/dL (ref 6–20)
CO2: 21 mmol/L — ABNORMAL LOW (ref 22–32)
Calcium: 9.1 mg/dL (ref 8.9–10.3)
Chloride: 103 mmol/L (ref 98–111)
Creatinine, Ser: 1.16 mg/dL (ref 0.61–1.24)
GFR, Estimated: >60 mL/min (ref >60)
Glucose, Bld: 84 mg/dL (ref 70–99)
Potassium: 4.5 mmol/L (ref 3.5–5.1)
Sodium: 136 mmol/L (ref 135–145)

## 2023-05-09 LAB — TROPONIN I (HIGH SENSITIVITY)
Troponin I (High Sensitivity): 3 ng/L (ref <18)
Troponin I (High Sensitivity): 3 ng/L (ref <18)

## 2023-05-09 LAB — BRAIN NATRIURETIC PEPTIDE: B Natriuretic Peptide: 18.7 pg/mL (ref 0.0–100.0)

## 2023-05-09 NOTE — ED Triage Notes (Addendum)
Pt to ED via POV c/o SOB for 2 days. Pt started having a "feeling of drowning" yesterday and called his doctor who told him to come here. The feeling of drowning has eased off some. Breathing worsened when he would lay down. Also complaining of dizziness and chest tightness. Pt with hx of anxiety and asthma, pt very anxious in triage

## 2023-05-09 NOTE — ED Provider Notes (Signed)
[facility] Provider Note    Event Date/Time   First MD Initiated Contact with Patient 05/09/23 1105     (approximate)   History   Shortness of Breath   HPI  Jeffery King is a 47 y.o. male  with h/o anxiety, asthma, who comes to the ED c/o sob  x 2 days. No chest pain, cough, fever. Not pleuritic or exertional. Feels better with walking or breathing cold air.  Was on xanax previously but DCd that 3 weeks ago.  No travel, trauma, hospitalizations, or surgery recently. No leg pain/swelling or h/o dvt/pe.      Physical Exam   Triage Vital Signs: ED Triage Vitals  Encounter Vitals Group     BP 05/09/23 0644 (!) 148/108     Systolic BP Percentile --      Diastolic BP Percentile --      Pulse Rate 05/09/23 0644 83     Resp 05/09/23 0644 18     Temp 05/09/23 0644 98.4 F (36.9 C)     Temp Source 05/09/23 0644 Oral     SpO2 05/09/23 0644 99 %     Weight 05/09/23 0641 175 lb (79.4 kg)     Height 05/09/23 0641 5\' 10"  (1.778 m)     Head Circumference --      Peak Flow --      Pain Score 05/09/23 0641 5     Pain Loc --      Pain Education --      Exclude from Growth Chart --     Most recent vital signs: Vitals:   05/09/23 0644 05/09/23 1058  BP: (!) 148/108 (!) 140/104  Pulse: 83 91  Resp: 18 16  Temp: 98.4 F (36.9 C)   SpO2: 99% 98%    General: Awake, no distress.  CV:  Good peripheral perfusion. RRR Resp:  Normal effort. ctab Abd:  No distention.  Other:  Rapid speech. Normal mental status   ED Results / Procedures / Treatments   Labs (all labs ordered are listed, but only abnormal results are displayed) Labs Reviewed  BASIC METABOLIC PANEL - Abnormal; Notable for the following components:      Result Value   CO2 21 (*)    All other components within normal limits  CBC  BRAIN NATRIURETIC PEPTIDE  TROPONIN I (HIGH SENSITIVITY)  TROPONIN I (HIGH SENSITIVITY)     RADIOLOGY Chest xray interpreted by me, appears normal.  Radiology report reviewed   PROCEDURES:  Procedures   MEDICATIONS ORDERED IN ED: Medications - No data to display   IMPRESSION / MDM / ASSESSMENT AND PLAN / ED COURSE  I reviewed the triage vital signs and the nursing notes.                              Differential diagnosis includes, but is not limited to, pna, ptx, pleural effusion, anxiety, nstemi. Doubt PE or dissection.  Pt p/w sob c/w anxiety history. Appears anxious. No chest pain, normal VS, overall non toxic appearance. Labs EKG cxr all okay, stable for DC.     FINAL CLINICAL IMPRESSION(S) / ED DIAGNOSES   Final diagnoses:  Shortness of breath  Anxiety     Rx / DC Orders   ED Discharge Orders     None        Note:  This document was prepared using Dragon voice recognition software and may include  unintentional dictation errors.   Sharman Cheek, MD 05/09/23 1159

## 2023-05-12 ENCOUNTER — Encounter: Payer: Self-pay | Admitting: Internal Medicine

## 2023-05-12 ENCOUNTER — Ambulatory Visit: Payer: Self-pay

## 2023-05-12 ENCOUNTER — Ambulatory Visit (INDEPENDENT_AMBULATORY_CARE_PROVIDER_SITE_OTHER): Payer: Medicaid Other | Admitting: Internal Medicine

## 2023-05-12 VITALS — BP 139/88 | HR 98 | Ht 70.0 in | Wt 177.0 lb

## 2023-05-12 DIAGNOSIS — R195 Other fecal abnormalities: Secondary | ICD-10-CM | POA: Diagnosis not present

## 2023-05-12 DIAGNOSIS — R0602 Shortness of breath: Secondary | ICD-10-CM | POA: Diagnosis not present

## 2023-05-12 DIAGNOSIS — R112 Nausea with vomiting, unspecified: Secondary | ICD-10-CM | POA: Diagnosis not present

## 2023-05-12 DIAGNOSIS — F419 Anxiety disorder, unspecified: Secondary | ICD-10-CM | POA: Diagnosis not present

## 2023-05-12 DIAGNOSIS — G44209 Tension-type headache, unspecified, not intractable: Secondary | ICD-10-CM | POA: Diagnosis not present

## 2023-05-12 DIAGNOSIS — H539 Unspecified visual disturbance: Secondary | ICD-10-CM

## 2023-05-12 DIAGNOSIS — J029 Acute pharyngitis, unspecified: Secondary | ICD-10-CM | POA: Diagnosis not present

## 2023-05-12 DIAGNOSIS — R0981 Nasal congestion: Secondary | ICD-10-CM | POA: Diagnosis not present

## 2023-05-12 DIAGNOSIS — H938X3 Other specified disorders of ear, bilateral: Secondary | ICD-10-CM | POA: Diagnosis not present

## 2023-05-12 LAB — POCT RAPID STREP A (OFFICE): Rapid Strep A Screen: NEGATIVE

## 2023-05-12 LAB — POC COVID19 BINAXNOW: SARS Coronavirus 2 Ag: NEGATIVE

## 2023-05-12 MED ORDER — KETOROLAC TROMETHAMINE 30 MG/ML IJ SOLN
30.0000 mg | Freq: Once | INTRAMUSCULAR | Status: AC
  Administered 2023-05-12: 30 mg via INTRAMUSCULAR

## 2023-05-12 NOTE — Telephone Encounter (Signed)
  Chief Complaint: Difficulty swallowing, eye swelling, Throat swelling Symptoms: above Frequency: today Pertinent Negatives: Patient denies  Disposition: [] ED /[] Urgent Care (no appt availability in office) / [x] Appointment(In office/virtual)/ []  [facility]/ [] Home Care/ [] Refused Recommended Disposition /[] [facility]/ []  Follow-up with PCP Additional Notes: Pt was seen yesterday at Ed for palpitations, SOB, and anxiety. Pt states that today he has some facial swelling, and difficulty swallowing. Pt sounds anxious. Pt will call EMS if needed, but has chosen to come into the office to be seen today.   Reason for Disposition  [1] Swallowing difficulty AND [2] cause unknown  (Exception: Difficulty swallowing is a chronic symptom.)  Answer Assessment - Initial Assessment Questions 1. DESCRIPTION: "Tell me more about this problem." "Are you  having trouble swallowing liquids, solids, or both?" "Any trouble with swallowing saliva (spit)?"     Trouble swallowing 2. SEVERITY: "How bad is the swallowing difficulty?"  (e.g., Scale 1-10; or mild, moderate, severe)   - MILD (0-3): Occasional swallowing difficulty; has trouble swallowing certain types of foods or liquids.   - MODERATE (4-7): Frequent swallowing difficulty; only able to swallow small amounts of foods and fluids.   - SEVERE (8-10): Unable to swallow any foods, fluids, or saliva; sensation of "lump in throat" or "something stuck in throat", and frequent drooling or spitting may be present.     Mild - moderate 3. ONSET: "When did the swallowing problems begin?"      Today 4. CAUSE: "What do you think is causing the problem?"  (e.g., dry mouth, food or pill stuck in throat, mouth pain, sore throat, progression of disease process such as dementia or Parkinson's disease).      Unsure - swollen throat, face  6. OTHER SYMPTOMS: "Do you have any other symptoms?" (e.g., chest pain, difficulty breathing, mouth sores, sore  throat, swollen tongue, chest pain)     Eye swelling  Protocols used: Swallowing Difficulty-A-AH

## 2023-05-12 NOTE — Addendum Note (Signed)
Addended by: Judd Gaudier on: 05/12/2023 02:42 PM   Modules accepted: Orders

## 2023-05-12 NOTE — Progress Notes (Signed)
Subjective:    Patient ID: Jeffery King, male    DOB: Nov 18, 1975, 47 y.o.   MRN: [id number]  HPI  Patient presents to clinic today for ER follow-up.  He presented to the ER 7/19 with complaint of shortness of breath.  He has a history of asthma managed with albuterol as needed.  Labs were unremarkable.  ECG did not show any acute findings.  Chest x-ray did not show any evidence of infiltrate.  He appeared acutely anxious in the ER and they felt like his symptoms were related to anxiety.  He was discharged advised to follow-up with his PCP.  He is currently taking Ativan as needed for anxiety.  He is not currently seeing a therapist.  He denies depression, SI/HI. He reports today, he has noticed some headache, vision changes, ear fullness, nasal congestion, difficulty swallowing. He feels like his glands are swollen. The headache is located all over. He describes the pain as squeezing. He reports an associated tingling and vibration in his head.  He reports the headache is worse when he takes a deep breath.  He does feel like his eyes are shaking.  He is not blowing any mucus out of his nose.  He does feel like he is having difficulty swallowing.  He reports associated nausea with 1 episode of vomiting and loose stools. He denies constipation or blood in his stools. He denies fever but has had but has had chills and body aches. He has tried tylenol OTC with minimal relief of symptoms. He has not had sick contacts.  Review of Systems     Past Medical History:  Diagnosis Date   Anxiety    Asthma     Current Outpatient Medications  Medication Sig Dispense Refill   loratadine (ALLERGY RELIEF) 10 MG tablet Take 1 tablet (10 mg total) by mouth daily. 90 tablet 3   LORazepam (ATIVAN) 0.5 MG tablet Take 0.5 tablets (0.25 mg total) by mouth daily as needed for anxiety. 20 tablet 1   VENTOLIN HFA 108 (90 Base) MCG/ACT inhaler Inhale 2 puffs into the lungs every 4 (four) hours as needed for wheezing or  shortness of breath. 8.5 g 2   No current facility-administered medications for this visit.    Allergies  Allergen Reactions   Aspirin Shortness Of Breath and Swelling   Gabapentin Shortness Of Breath   Wellbutrin [Bupropion] Shortness Of Breath   Ibuprofen Swelling   Meloxicam Other (See Comments)   Naproxen Swelling    Lips    Family History  Problem Relation Age of Onset   Hypertension Mother    Hypertension Father    Hypertension Brother     Social History   Socioeconomic History   Marital status: Single    Spouse name: Not on file   Number of children: Not on file   Years of education: Not on file   Highest education level: Not on file  Occupational History   Not on file  Tobacco Use   Smoking status: Former    Current packs/day: 0.00    Types: Cigarettes    Quit date: 05/30/2014    Years since quitting: 8.9   Smokeless tobacco: Former  Building services engineer status: Never Used  Substance and Sexual Activity   Alcohol use: No   Drug use: No   Sexual activity: Not on file  Other Topics Concern   Not on file  Social History Narrative   Not on file   Social  Determinants of Health   Financial Resource Strain: Not on file  Food Insecurity: Not on file  Transportation Needs: Not on file  Physical Activity: Not on file  Stress: Not on file  Social Connections: Not on file  Intimate Partner Violence: Not on file     Constitutional: Patient reports headache, chills.  Denies fever, malaise, fatigue, or abrupt weight changes.  HEENT: Patient reports vision changes, nasal congestion, ear fullness and sore throat.  Denies eye pain, eye redness, ear pain, ringing in the ears, wax buildup, runny nose, bloody nose. Respiratory: Patient reports shortness of breath.  Denies difficulty breathing, cough or sputum production.   Cardiovascular: Denies chest pain, chest tightness, palpitations or swelling in the hands or feet.  Gastrointestinal: Patient reports nausea and  1 episode of vomiting, loose stools.  Denies abdominal pain, bloating, constipation, diarrhea or blood in the stool.  GU: Denies urgency, frequency, pain with urination, burning sensation, blood in urine, odor or discharge. Musculoskeletal: Patient reports body aches.  Denies decrease in range of motion, difficulty with gait, muscle pain or joint pain and swelling.  Skin: Denies redness, rashes, lesions or ulcercations.  Neurological: Patient reports paresthesia of scalp.  Denies dizziness, difficulty with memory, difficulty with speech or problems with balance and coordination.  Psych: Patient has a history of anxiety.  Denies depression, SI/HI.  No other specific complaints in a complete review of systems (except as listed in HPI above).  Objective:   Physical Exam  BP 139/88   Pulse 98   Ht 5\' 10"  (1.778 m)   Wt 177 lb (80.3 kg)   SpO2 99%   BMI 25.40 kg/m   Wt Readings from Last 3 Encounters:  05/09/23 175 lb (79.4 kg)  03/20/23 179 lb (81.2 kg)  01/02/23 171 lb 3.2 oz (77.7 kg)    General: Appears his stated age, well developed, well nourished in NAD. Skin: Warm, dry and intact. No rashes noted. HEENT: Head: normal shape and size, no sinus tenderness noted; Eyes: sclera white, no icterus, conjunctiva pink, PERRLA and EOMs intact, stye noted of left lower eyelid; Ears: Tm's gray and intact, normal light reflex; Nose: mucosa boggy and moist, septum deviated; Throat/Mouth: Teeth present, mucosa erythematous and moist, + PND, no exudate, lesions or ulcerations noted.  Neck: No adenopathy Cardiovascular: Normal rate and rhythm. S1,S2 noted.  No murmur, rubs or gallops noted. No JVD or BLE edema. No carotid bruits noted. Pulmonary/Chest: Normal effort and positive vesicular breath sounds. No respiratory distress. No wheezes, rales or ronchi noted.  Abdomen: Soft and nontender. Normal bowel sounds.  Musculoskeletal:  No difficulty with gait.  Neurological: Alert and oriented.  Coordination normal.  Psychiatric: Mood and affect normal.  He is extremely anxious appearing. Judgment and thought content normal.    BMET    Component Value Date/Time   NA 136 05/09/2023 0653   K 4.5 05/09/2023 0653   CL 103 05/09/2023 0653   CO2 21 (L) 05/09/2023 0653   GLUCOSE 84 05/09/2023 0653   BUN 18 05/09/2023 0653   CREATININE 1.16 05/09/2023 0653   CREATININE 1.15 03/20/2021 1003   CALCIUM 9.1 05/09/2023 0653   GFRNONAA >60 05/09/2023 0653   GFRNONAA 76 03/20/2021 1003   GFRAA 89 03/20/2021 1003    Lipid Panel     Component Value Date/Time   CHOL 173 03/20/2021 1003   TRIG 173 (H) 03/20/2021 1003   HDL 50 03/20/2021 1003   CHOLHDL 3.5 03/20/2021 1003   VLDL 14 05/26/2017  0830   LDLCALC 96 03/20/2021 1003    CBC    Component Value Date/Time   WBC 6.7 05/09/2023 0653   RBC 5.27 05/09/2023 0653   HGB 16.0 05/09/2023 0653   HCT 46.8 05/09/2023 0653   PLT 192 05/09/2023 0653   MCV 88.8 05/09/2023 0653   MCH 30.4 05/09/2023 0653   MCHC 34.2 05/09/2023 0653   RDW 12.6 05/09/2023 0653   LYMPHSABS 1,476 03/20/2021 1003   MONOABS 678 05/26/2017 0830   EOSABS 9 (L) 03/20/2021 1003   BASOSABS 19 03/20/2021 1003    Hgb A1C Lab Results  Component Value Date   HGBA1C 5.2 03/20/2021            Assessment & Plan:   ER follow-up for acute headache, vision changes, paresthesia of scalp, nasal congestion, ear fullness, shortness of breath, nausea, vomiting, loose stools, anxiety:  ER notes, labs and imaging reviewed Advised him to continue his current Ativan prescription, further management of his anxiety should be done by his PCP COVID test negative  rapid strep negative 30 mg Toradol IM for headache Advised to start Claritin and Flonase OTC for symptom management No indication for antibiotics at this time given that he is only been having symptoms for 4 days Would avoid steroids given his anxiety as this could worsen his symptoms   Follow-up with  your PCP as previously scheduled Nicki Reaper, NP

## 2023-05-13 ENCOUNTER — Inpatient Hospital Stay: Payer: Medicaid Other | Admitting: Internal Medicine

## 2023-05-13 ENCOUNTER — Telehealth: Payer: Self-pay

## 2023-05-13 NOTE — Telephone Encounter (Signed)
Copied from CRM [id number]. Topic: General - Other >> May 13, 2023 11:41 AM Macon Large wrote: Reason for CRM: Pt reports that he had an appt on 05/12/23 and 4 medications were suppose to be sent to his pharmacy. Pt stated he contacted his pharmacy but he was advised that no prescriptions were received. Pt requests call back once prescriptions have been sent to [facility] - [place], [place] - [address] Phone: [phone]  Fax:[phone]

## 2023-05-13 NOTE — Telephone Encounter (Signed)
Spoke with patient and clarified his questions about medications.

## 2023-05-15 ENCOUNTER — Other Ambulatory Visit: Payer: Self-pay | Admitting: Family Medicine

## 2023-05-15 DIAGNOSIS — J3089 Other allergic rhinitis: Secondary | ICD-10-CM

## 2023-05-15 NOTE — Telephone Encounter (Signed)
Requested Prescriptions  Pending Prescriptions Disp Refills   loratadine (ALLERGY RELIEF) 10 MG tablet [Pharmacy Med Name: ALLERGY RELIEF 10 MG TAB] 90 tablet 0    Sig: TAKE ONE TABLET EVERY DAY     Ear, Nose, and Throat:  Antihistamines 2 Passed - 05/15/2023  1:16 PM      Passed - Cr in normal range and within 360 days    Creat  Date Value Ref Range Status  03/20/2021 1.15 0.60 - 1.35 mg/dL Final   Creatinine, Ser  Date Value Ref Range Status  05/09/2023 1.16 0.61 - 1.24 mg/dL Final         Passed - Valid encounter within last 12 months    Recent Outpatient Visits           3 days ago Acute non intractable tension-type headache   [place] [facility] [place], Salvadore Oxford, NP   1 month ago Anxiety   [place] [facility] Smitty Cords, DO   4 months ago Anxiety   [place] [facility] Smitty Cords, DO   1 year ago Annual physical exam   [place] [facility] Smitty Cords, DO   1 year ago Mild persistent asthma without complication   [place] [facility] [place], Netta Neat, [place]

## 2023-06-05 ENCOUNTER — Ambulatory Visit
Admission: EM | Admit: 2023-06-05 | Discharge: 2023-06-05 | Disposition: A | Discharge status: Home / Self Care | Payer: Medicaid Other | Attending: Emergency Medicine | Admitting: Emergency Medicine

## 2023-06-05 ENCOUNTER — Ambulatory Visit: Payer: Self-pay

## 2023-06-05 DIAGNOSIS — H00015 Hordeolum externum left lower eyelid: Secondary | ICD-10-CM

## 2023-06-05 DIAGNOSIS — H6692 Otitis media, unspecified, left ear: Secondary | ICD-10-CM | POA: Diagnosis not present

## 2023-06-05 DIAGNOSIS — R030 Elevated blood-pressure reading, without diagnosis of hypertension: Secondary | ICD-10-CM | POA: Diagnosis not present

## 2023-06-05 DIAGNOSIS — Z760 Encounter for issue of repeat prescription: Secondary | ICD-10-CM

## 2023-06-05 DIAGNOSIS — R21 Rash and other nonspecific skin eruption: Secondary | ICD-10-CM | POA: Diagnosis not present

## 2023-06-05 DIAGNOSIS — L03213 Periorbital cellulitis: Secondary | ICD-10-CM

## 2023-06-05 MED ORDER — AMOXICILLIN-POT CLAVULANATE 875-125 MG PO TABS: 1.0000 | ORAL_TABLET | Freq: Two times a day (BID) | ORAL | 0 refills | Status: DC

## 2023-06-05 MED ORDER — SELENIUM SULFIDE 2.5 % EX LOTN: 1.0000 | TOPICAL_LOTION | Freq: Every day | CUTANEOUS | 0 refills | Status: DC | PRN

## 2023-06-05 NOTE — Telephone Encounter (Signed)
  Chief Complaint: Swollen left eye, ear pain and pus on tonsils.  Symptoms: above Frequency:  Pertinent Negatives: Patient denies  Disposition: [] ED /[x] Urgent Care (no appt availability in office) / [] Appointment(In office/virtual)/ []  [place] [facility]/ [] Home Care/ [] Refused Recommended Disposition /[] [place] [facility]/ []  Follow-up with PCP Additional Notes: Pt states that he recently had a sty which has mostly resolved. Someone in the household had "pink eye" last week. Pt states his left eye is swollen, his left ear hurts and  he can see pus on this tonsils. No appts in office. Pt will go to UC.    Reason for Disposition  [1] SEVERE pain AND [2] not improved 2 hours after taking analgesic medication (e.g., ibuprofen or acetaminophen)  Answer Assessment - Initial Assessment Questions 1. LOCATION: "Which ear is involved?"     left 2. ONSET: "When did the ear start hurting"      recently 3. SEVERITY: "How bad is the pain?"  (Scale 1-10; mild, moderate or severe)   - MILD (1-3): doesn't interfere with normal activities    - MODERATE (4-7): interferes with normal activities or awakens from sleep    - SEVERE (8-10): excruciating pain, unable to do any normal activities      moderate 4. URI SYMPTOMS: "Do you have a runny nose or cough?"     No - swollen eye 7. OTHER SYMPTOMS: "Do you have any other symptoms?" (e.g., headache, stiff neck, dizziness, vomiting, runny nose, decreased hearing)     Swollen eye - recent sty. Pus on tonsils.  Protocols used: Davina Poke

## 2023-06-05 NOTE — Discharge Instructions (Addendum)
Your blood pressure is elevated today at 158/105.  Please have this rechecked by your primary care provider next week.      Take the Augmentin as directed.    Use the selenium sulfide lotion as directed.    Follow up with your primary care provider if your symptoms are not improving.

## 2023-06-05 NOTE — Telephone Encounter (Signed)
FYI- he is going to [facility]

## 2023-06-05 NOTE — ED Triage Notes (Signed)
Patient to Urgent Care with complaints of left sided ear pain and left sided eye swelling/ pain.  Symptoms started yesterday. Recurrent styes on left lower eyelid. Drainage and redness.

## 2023-06-05 NOTE — ED Provider Notes (Signed)
Jeffery King    CSN: [id number] Arrival date & time: 06/05/23  1252      History   Chief Complaint Chief Complaint  Patient presents with   Otalgia   Eye Problem    HPI Jeffery King is a 47 y.o. male.  Patient presents with 1 day history of left ear pain.  He also reports a stye in his left eye for several days.  His left eye has become red and swollen x 1 day.  No eye drainage, change in vision, eye pain, fever, chills, ear drainage, sore throat, cough, shortness of breath, or other symptoms.  Patient also requests a refill on selenium sulfide lotion for a recurrent rash on his thighs.  He states this was prescribed several years ago and worked well.  His medical history includes asthma, GERD, allergies, anxiety, degenerative disc disease.  The history is provided by the patient and medical records.    Past Medical History:  Diagnosis Date   Anxiety    Asthma     Patient Active Problem List   Diagnosis Date Noted   Bilateral upper abdominal discomfort 09/17/2017   Environmental and seasonal allergies 02/20/2017   Vitamin D insufficiency 02/20/2017   Gastroesophageal reflux disease without esophagitis 08/01/2016   Anxiety 03/28/2016   Mild persistent asthma 03/28/2016   Degenerative disc disease, cervical 05/18/2014    History reviewed. No pertinent surgical history.     Home Medications    Prior to Admission medications   Medication Sig Start Date End Date Taking? Authorizing Provider  amoxicillin-clavulanate (AUGMENTIN) 875-125 MG tablet Take 1 tablet by mouth every 12 (twelve) hours. 06/05/23  Yes Mickie Bail, NP  selenium sulfide (SELSUN) 2.5 % lotion Apply 1 Application topically daily as needed for irritation. 06/05/23  Yes Mickie Bail, NP  loratadine (ALLERGY RELIEF) 10 MG tablet TAKE ONE TABLET EVERY DAY 05/15/23   Karamalegos, Netta Neat, DO  VENTOLIN HFA 108 (90 Base) MCG/ACT inhaler Inhale 2 puffs into the lungs every 4 (four) hours as needed  for wheezing or shortness of breath. 08/19/22   Smitty Cords, DO    Family History Family History  Problem Relation Age of Onset   Hypertension Mother    Hypertension Father    Hypertension Brother     Social History Social History   Tobacco Use   Smoking status: Former    Current packs/day: 0.00    Types: Cigarettes    Quit date: 05/30/2014    Years since quitting: 9.0   Smokeless tobacco: Former  Building services engineer status: Never Used  Substance Use Topics   Alcohol use: No   Drug use: No     Allergies   Aspirin, Gabapentin, Wellbutrin [bupropion], Ibuprofen, Meloxicam, and Naproxen   Review of Systems Review of Systems  Constitutional:  Negative for chills and fever.  HENT:  Positive for ear pain. Negative for ear discharge and sore throat.   Eyes:  Positive for redness. Negative for pain, discharge and visual disturbance.  Respiratory:  Negative for cough and shortness of breath.   Skin:  Positive for color change and rash.     Physical Exam Triage Vital Signs ED Triage Vitals  Encounter Vitals Group     BP 06/05/23 1345 (!) 158/105     Systolic BP Percentile --      Diastolic BP Percentile --      Pulse Rate 06/05/23 1336 88     Resp 06/05/23 1336 18  Temp 06/05/23 1336 98 F (36.7 C)     Temp src --      SpO2 06/05/23 1336 98 %     Weight --      Height --      Head Circumference --      Peak Flow --      Pain Score 06/05/23 1346 4     Pain Loc --      Pain Education --      Exclude from Growth Chart --    No data found.  Updated Vital Signs BP (!) 158/105   Pulse 88   Temp 98 F (36.7 C)   Resp 18   SpO2 98%   Visual Acuity Right Eye Distance:   Left Eye Distance:   Bilateral Distance:    Right Eye Near:   Left Eye Near:    Bilateral Near:     Physical Exam Vitals and nursing note reviewed.  Constitutional:      General: He is not in acute distress.    Appearance: He is well-developed.  HENT:     Right Ear:  Tympanic membrane and ear canal normal.     Left Ear: Ear canal normal. Tympanic membrane is erythematous.     Nose: Nose normal.     Mouth/Throat:     Mouth: Mucous membranes are moist.     Pharynx: Oropharynx is clear.  Eyes:     General: Vision grossly intact.        Right eye: No discharge.        Left eye: No discharge.     Extraocular Movements: Extraocular movements intact.     Pupils: Pupils are equal, round, and reactive to light.      Comments: Left lower eyelid mildly edematous with localized erythema.   Cardiovascular:     Rate and Rhythm: Normal rate and regular rhythm.     Heart sounds: Normal heart sounds.  Pulmonary:     Effort: Pulmonary effort is normal. No respiratory distress.     Breath sounds: Normal breath sounds.  Musculoskeletal:     Cervical back: Neck supple.  Skin:    General: Skin is warm and dry.  Neurological:     Mental Status: He is alert.      UC Treatments / Results  Labs (all labs ordered are listed, but only abnormal results are displayed) Labs Reviewed - No data to display  EKG   Radiology No results found.  Procedures Procedures (including critical care time)  Medications Ordered in UC Medications - No data to display  Initial Impression / Assessment and Plan / UC Course  I have reviewed the triage vital signs and the nursing notes.  Pertinent labs & imaging results that were available during my care of the patient were reviewed by me and considered in my medical decision making (see chart for details).    Left lower eyelid stye, preseptal cellulitis of left eye.  Left otitis media.  Rash, medication refill.  Elevated blood pressure reading.  Treating preseptal cellulitis and ear infection with Augmentin.  ED precautions given.  Education provided on preseptal cellulitis and otitis media.  Instructed patient to follow-up with his PCP.  Refill of selenium sulfide 2.5% lotion provided per patient request for a recurrent rash.   Instructed him to follow-up with his PCP.  Discussed with patient that his blood pressure is elevated today and needs to be rechecked by his PCP next week.  Education provided on preventing  hypertension.  Patient agrees to plan of care.  Final Clinical Impressions(s) / UC Diagnoses   Final diagnoses:  Hordeolum externum of left lower eyelid  Preseptal cellulitis of left eye  Left otitis media, unspecified otitis media type  Rash  Encounter for medication refill  Elevated blood pressure reading     Discharge Instructions      Your blood pressure is elevated today at 158/105.  Please have this rechecked by your primary care provider next week.      Take the Augmentin as directed.    Use the selenium sulfide lotion as directed.    Follow up with your primary care provider if your symptoms are not improving.          ED Prescriptions     Medication Sig Dispense Auth. Provider   amoxicillin-clavulanate (AUGMENTIN) 875-125 MG tablet Take 1 tablet by mouth every 12 (twelve) hours. 14 tablet Mickie Bail, NP   selenium sulfide (SELSUN) 2.5 % lotion Apply 1 Application topically daily as needed for irritation. 118 mL Mickie Bail, NP      PDMP not reviewed this encounter.   Mickie Bail, NP 06/05/23 1451

## 2023-06-26 ENCOUNTER — Encounter: Payer: Self-pay | Admitting: Family Medicine

## 2023-06-26 ENCOUNTER — Telehealth (INDEPENDENT_AMBULATORY_CARE_PROVIDER_SITE_OTHER): Payer: Medicaid Other | Admitting: Family Medicine

## 2023-06-26 DIAGNOSIS — R221 Localized swelling, mass and lump, neck: Secondary | ICD-10-CM | POA: Diagnosis not present

## 2023-06-26 DIAGNOSIS — H8110 Benign paroxysmal vertigo, unspecified ear: Secondary | ICD-10-CM

## 2023-06-26 DIAGNOSIS — H938X3 Other specified disorders of ear, bilateral: Secondary | ICD-10-CM

## 2023-06-26 DIAGNOSIS — J321 Chronic frontal sinusitis: Secondary | ICD-10-CM

## 2023-06-26 NOTE — Patient Instructions (Signed)
Thank you for coming to the office today.  1. You have symptoms of Vertigo (Benign Paroxysmal Positional Vertigo) - This is commonly caused by inner ear fluid imbalance, sometimes can be worsened by allergies and sinus symptoms, otherwise it can occur randomly sometimes and we may never discover the exact cause.  - To treat this, try the Epley Manuever (see diagrams/instructions below) at home up to 3 times a day for 1-2 weeks or until symptoms resolve  Please schedule a follow-up appointment with Dr Althea Charon within 4 weeks if Vertigo not improving, and will consider Referral to Vestibular Rehab  See the next page for images describing the Epley Manuever.     ----------------------------------------------------------------------------------------------------------------------         Please schedule a Follow-up Appointment to: No follow-ups on file.  If you have any other questions or concerns, please feel free to call the office or send a message through MyChart. You may also schedule an earlier appointment if necessary.  Additionally, you may be receiving a survey about your experience at our office within a few days to 1 week by e-mail or mail. We value your feedback.  [name], DO [facility], [place]

## 2023-06-26 NOTE — Progress Notes (Signed)
Subjective:    Patient ID: Jeffery King, male    DOB: [birth year], 47 y.o.   MRN: [id number]  Jeffery King is a 46 y.o. male presenting on 06/26/2023 for No chief complaint on file.  Virtual / Telehealth Encounter - Video Visit via MyChart The purpose of this virtual visit is to provide medical care while limiting exposure to the novel coronavirus (COVID19) for both patient and office staff.  Consent was obtained for remote visit:  Yes.   Answered questions that patient had about telehealth interaction:  Yes.   I discussed the limitations, risks, security and privacy concerns of performing an evaluation and management service by video/telephone. I also discussed with the patient that there may be a patient responsible charge related to this service. The patient expressed understanding and agreed to proceed.  Patient Location: Home Provider Location: [facility] (Office)  Participants in virtual visit: - Patient: Jeffery King - CMA: Shirley Muscat CMA - Provider: Dr Althea King   HPI  Vertigo, BPPV Ear and Sinus Pressure History of neck swelling and lymphadenopathy He reports acute onset dizzy spell with room spinning as soon as he sat up he felt dizzy spell. He felt like he could not stand up without getting dizzy or room spinning. He got up and made it to the bathroom and he felt very nauseas and he had a vomiting episode. Currently feeling better and dizzy slowing down and nausea improving. Admits sinus pressure and swelling pressure behind L ear  Left Ear He feels lump within ear was noticeable to him, recently seen by Urgent Care 8/15 and this was not appreciated on their exam. He was treated for sinusitis and dx with hordeolum as well. Improved after antibiotic course.  He still feels swelling and clogged within glands  Interested in Referral ENT     03/20/2023    9:40 AM 01/02/2023   11:12 AM 03/27/2022    9:11 AM  Depression screen PHQ 2/9  Decreased  Interest 2 1 2   Down, Depressed, Hopeless 2 0 0  PHQ - 2 Score 4 1 2   Altered sleeping 2 1 0  Tired, decreased energy 2 1 1   Change in appetite 1 1 1   Feeling bad or failure about yourself  1 1 0  Trouble concentrating 1 2 0  Moving slowly or fidgety/restless 0 1 0  Suicidal thoughts 0 0 0  PHQ-9 Score 11 8 4   Difficult doing work/chores  Somewhat difficult Not difficult at all    Social History   Tobacco Use   Smoking status: Former    Current packs/day: 0.00    Types: Cigarettes    Quit date: 05/30/2014    Years since quitting: 9.0   Smokeless tobacco: Former  Building services engineer status: Never Used  Substance Use Topics   Alcohol use: No   Drug use: No    Review of Systems Per HPI unless specifically indicated above     Objective:    There were no vitals taken for this visit.  Wt Readings from Last 3 Encounters:  05/12/23 177 lb (80.3 kg)  05/09/23 175 lb (79.4 kg)  03/20/23 179 lb (81.2 kg)    Physical Exam  Note examination was completely remotely via video observation objective data only  Gen - well-appearing, no acute distress or apparent pain, comfortable HEENT - eyes appear clear without discharge or redness Heart/Lungs - cannot examine virtually - observed no evidence of coughing or labored  breathing. Abd - cannot examine virtually  Skin - face visible today- no rash Neuro - awake, alert, oriented Psych - mild anxious   Results for orders placed or performed in visit on 05/12/23  POC COVID-19  Result Value Ref Range   SARS Coronavirus 2 Ag Negative Negative  POCT rapid strep A  Result Value Ref Range   Rapid Strep A Screen Negative Negative      Assessment & Plan:   Problem List Items Addressed This Visit   None Visit Diagnoses     Benign paroxysmal positional vertigo, unspecified laterality    -  Primary   Relevant Orders   Ambulatory referral to ENT   Sensation of fullness in both ears       Relevant Orders   Ambulatory referral to  ENT   Neck swelling       Relevant Orders   Ambulatory referral to ENT   Chronic frontal sinusitis       Relevant Orders   Ambulatory referral to ENT      Suspected BPPV by history - No other significant neurological findings or focal deficits Known chronic sinusitis / ear pressure / head neck lymphadenopathy concerns previously  Plan: 1. Handout given with Epley maneuver TID for 1-2 weeks until resolved 2. Offer Meclizine, he declines today, prefers to avoid med 3. Referral to [facility] for further eval of various related issues and the new onset Vertigo, he has had chronic sinus / inner ear dysfunction, may warrant imaging or other therapy   Orders Placed This Encounter  Procedures   Ambulatory referral to ENT    Referral Priority:   Routine    Referral Type:   Consultation    Referral Reason:   Specialty Services Required    Requested Specialty:   Otolaryngology    Number of Visits Requested:   1     No orders of the defined types were placed in this encounter.    Follow up plan: Return if symptoms worsen or fail to improve.  Patient verbalizes understanding with the above medical recommendations including the limitation of remote medical advice.  Specific follow-up and call-back criteria were given for patient to follow-up or seek medical care more urgently if needed.  Total duration of direct patient care provided via video conference: 15 minutes   [name], DO [facility] 06/26/2023, 9:45 AM

## 2023-07-01 ENCOUNTER — Ambulatory Visit: Payer: Self-pay

## 2023-07-01 NOTE — Telephone Encounter (Addendum)
Tried calling; pt's voice mail box is full.    It looks like pt went to [facility] for his back pain.  If the provider at [facility] prescribe him tylenol 3 pt will have to address changing it to them.  If he needs Dr. Althea Charon to prescribe something stronger will he will need to schedule an appointment with Dr. Kirtland Bouchard.    Dr. Kirtland Bouchard is completely booked today he would just have to be seen the next available appointment.     Thank you,   -Vernona Rieger

## 2023-07-01 NOTE — Telephone Encounter (Signed)
Copied from CRM [id number]. Topic: General - Other >> Jul 01, 2023  9:13 AM Dondra Prader A wrote: Reason for CRM: Please view NT message sent over today. Pt is calling back to let his PCP know that Tylenol 3 is not going to do anything for his pain. The least time he was prescribed Oxycodone that is what worked. Per pt he has to be at work at [employer] and is needing to make sure his PCP will call him some medication in for his pain. Please advise

## 2023-07-01 NOTE — Telephone Encounter (Signed)
  Chief Complaint: Back pain Symptoms: Back pain 10/10 Frequency: ongoing  - much worse last night to today Pertinent Negatives: Patient denies  Disposition: [] ED /[x] Urgent Care (no appt availability in office) / [] Appointment(In office/virtual)/ []  [facility]/ [] Home Care/ [] Refused Recommended Disposition /[] [facility]/ []  Follow-up with PCP Additional Notes: Pt had back soreness, and then picked up a televison which made pain much worse.  Pt states that in the past he has been referred to ortho and received injection which was very helpful. Pt would like another referral.  No appts in office. Pt wants to be seen today.  Pt would like pain medication for back. Pt will go to [facility] for care today. Please advise.    Reason for Disposition  [1] Numbness in an arm or hand (i.e., loss of sensation) AND [2] upper back pain  Answer Assessment - Initial Assessment Questions 1. ONSET: "When did the pain begin?"      Soreness started 3 days go - then picked up TV which irritated 2. LOCATION: "Where does it hurt?" (upper, mid or lower back)     Back 3. SEVERITY: "How bad is the pain?"  (e.g., Scale 1-10; mild, moderate, or severe)   - MILD (1-3): Doesn't interfere with normal activities.    - MODERATE (4-7): Interferes with normal activities or awakens from sleep.    - SEVERE (8-10): Excruciating pain, unable to do any normal activities.      severe 4. PATTERN: "Is the pain constant?" (e.g., yes, no; constant, intermittent)      constant 5. RADIATION: "Does the pain shoot into your legs or somewhere else?"     To shoulder 6. CAUSE:  "What do you think is causing the back pain?"      Overuse 7. BACK OVERUSE:  "Any recent lifting of heavy objects, strenuous work or exercise?"     Overuse 8. MEDICINES: "What have you taken so far for the pain?" (e.g., nothing, acetaminophen, NSAIDS)     none 9. NEUROLOGIC SYMPTOMS: "Do you have any weakness, numbness, or problems  with bowel/bladder control?"     Numbness in fingertips  Protocols used: Back Pain-A-AH

## 2023-07-02 ENCOUNTER — Other Ambulatory Visit: Payer: Self-pay | Admitting: Family Medicine

## 2023-07-02 DIAGNOSIS — J4530 Mild persistent asthma, uncomplicated: Secondary | ICD-10-CM

## 2023-07-02 DIAGNOSIS — M5412 Radiculopathy, cervical region: Secondary | ICD-10-CM | POA: Diagnosis not present

## 2023-07-02 DIAGNOSIS — M542 Cervicalgia: Secondary | ICD-10-CM | POA: Diagnosis not present

## 2023-07-03 DIAGNOSIS — M5412 Radiculopathy, cervical region: Secondary | ICD-10-CM | POA: Diagnosis not present

## 2023-07-03 DIAGNOSIS — M542 Cervicalgia: Secondary | ICD-10-CM | POA: Diagnosis not present

## 2023-07-06 ENCOUNTER — Other Ambulatory Visit: Payer: Self-pay

## 2023-07-06 ENCOUNTER — Emergency Department
Admission: EM | Admit: 2023-07-06 | Discharge: 2023-07-06 | Disposition: A | Discharge status: Home / Self Care | Payer: Medicaid Other | Attending: Emergency Medicine | Admitting: Emergency Medicine

## 2023-07-06 ENCOUNTER — Emergency Department: Payer: Medicaid Other

## 2023-07-06 ENCOUNTER — Encounter: Payer: Self-pay | Admitting: Radiology

## 2023-07-06 DIAGNOSIS — M5021 Other cervical disc displacement,  high cervical region: Secondary | ICD-10-CM | POA: Diagnosis not present

## 2023-07-06 DIAGNOSIS — M79603 Pain in arm, unspecified: Secondary | ICD-10-CM | POA: Diagnosis not present

## 2023-07-06 DIAGNOSIS — M5412 Radiculopathy, cervical region: Secondary | ICD-10-CM | POA: Diagnosis not present

## 2023-07-06 DIAGNOSIS — I10 Essential (primary) hypertension: Secondary | ICD-10-CM | POA: Diagnosis not present

## 2023-07-06 DIAGNOSIS — M542 Cervicalgia: Secondary | ICD-10-CM | POA: Diagnosis present

## 2023-07-06 DIAGNOSIS — M50221 Other cervical disc displacement at C4-C5 level: Secondary | ICD-10-CM | POA: Diagnosis not present

## 2023-07-06 DIAGNOSIS — M4802 Spinal stenosis, cervical region: Secondary | ICD-10-CM | POA: Diagnosis not present

## 2023-07-06 DIAGNOSIS — M50222 Other cervical disc displacement at C5-C6 level: Secondary | ICD-10-CM | POA: Diagnosis not present

## 2023-07-06 MED ORDER — MORPHINE SULFATE (PF) 4 MG/ML IV SOLN
4.0000 mg | Freq: Once | INTRAVENOUS | Status: AC
  Administered 2023-07-06: 4 mg via INTRAMUSCULAR
  Filled 2023-07-06: qty 1

## 2023-07-06 MED ORDER — MORPHINE SULFATE (PF) 2 MG/ML IV SOLN
2.0000 mg | Freq: Once | INTRAVENOUS | Status: AC
  Administered 2023-07-06: 2 mg via INTRAMUSCULAR
  Filled 2023-07-06: qty 1

## 2023-07-06 MED ORDER — OXYCODONE-ACETAMINOPHEN 10-325 MG PO TABS: 1.0000 | ORAL_TABLET | Freq: Four times a day (QID) | ORAL | 0 refills | Status: DC | PRN

## 2023-07-06 NOTE — ED Provider Notes (Signed)
[facility] Provider Note    Event Date/Time   First MD Initiated Contact with Patient 07/06/23 1847     (approximate)   History   Neck Pain   HPI  Jeffery King is a 47 y.o. male who presents for evaluation of neck pain that radiates down his left arm.  Patient is seen by Dr. Carmon Sails with [facility] and has an MRI of the cervical spine scheduled.  He has been taking a steroid Dosepak, muscle relaxers and Norco to treat his pain.  His pain is severe and he presents to the ED today to get an MRI.  Reports numbness and tingling as well as weakness in his left hand.  He states that when he picks things up he is afraid he is going to drop them.     Physical Exam   Triage Vital Signs: ED Triage Vitals  Encounter Vitals Group     BP 07/06/23 1636 (!) 165/130     Systolic BP Percentile --      Diastolic BP Percentile --      Pulse Rate 07/06/23 1632 79     Resp 07/06/23 1636 17     Temp 07/06/23 1632 97.9 F (36.6 C)     Temp Source 07/06/23 1632 Oral     SpO2 07/06/23 1632 98 %     Weight 07/06/23 1635 175 lb (79.4 kg)     Height 07/06/23 1635 5\' 10"  (1.778 m)     Head Circumference --      Peak Flow --      Pain Score 07/06/23 1635 10     Pain Loc --      Pain Education --      Exclude from Growth Chart --     Most recent vital signs: Vitals:   07/06/23 1632 07/06/23 1636  BP:  (!) 165/130  Pulse: 79   Resp:  17  Temp: 97.9 F (36.6 C)   SpO2: 98%    General: Awake, significant distress, pacing around the room, stroking and squeezing his arm, tearful. CV:  Good peripheral perfusion.  Resp:  Normal effort.  Abd:  No distention.  Other:  No tenderness to palpation along the neck left shoulder or left arm.  Strength is equal bilaterally.  Radial pulse 2+ and regular.  Sensation intact across all dermatomes.   ED Results / Procedures / Treatments   Labs (all labs ordered are listed, but only abnormal results are displayed) Labs Reviewed -  No data to display    RADIOLOGY  MRI of the cervical spine obtained, interpreted the images as well as reviewed the radiologist report which showed spinal canal stenosis at C5 and C6.   PROCEDURES:  Critical Care performed: No  Procedures   MEDICATIONS ORDERED IN ED: Medications  morphine (PF) 2 MG/ML injection 2 mg (2 mg Intramuscular Given 07/06/23 2019)  morphine (PF) 4 MG/ML injection 4 mg (4 mg Intramuscular Given 07/06/23 2019)  morphine (PF) 2 MG/ML injection 2 mg (2 mg Intramuscular Given 07/06/23 2104)     IMPRESSION / MDM / ASSESSMENT AND PLAN / ED COURSE  I reviewed the triage vital signs and the nursing notes.                             47 year old male presents for evaluation of ongoing neck pain and left arm pain.  Patient was hypertensive in triage otherwise VSS.  Patient in significant distress on exam due to the level of his pain.  Differential diagnosis includes, but is not limited to, cervical radiculopathy, muscle strain, brachial plexus injury, carpal tunnel.  Patient's presentation is most consistent with acute, uncomplicated illness.  MRI of the cervical spine obtained as patient reported numbness and weakness in his left hand.  I interpreted the images as well as reviewed the radiologist report which showed spinal canal stenosis at C5-C6.   Patient reports that he has not had any improvement with his muscle relaxer and steroids.  He is not interested in taking any benzodiazepines as he has taken these previously and has worked very hard to get off of them.  Patient did report an improvement in pain with the Norco although it is only temporary.  I will increase the dosage of his pain medication.  I advised him to follow-up with his orthopedic doctor.  I also gave him some stretches and exercises to do for cervical muscle strains.  Patient was agreeable to plan, voiced understanding and all questions were answered.      FINAL CLINICAL IMPRESSION(S) / ED  DIAGNOSES   Final diagnoses:  Cervical radiculopathy     Rx / DC Orders   ED Discharge Orders          Ordered    oxyCODONE-acetaminophen (PERCOCET) 10-325 MG tablet  Every 6 hours PRN        07/06/23 2212             Note:  This document was prepared using Dragon voice recognition software and may include unintentional dictation errors.   Cameron Ali, PA-C 07/06/23 2216    Corena Herter, MD 07/07/23 1259

## 2023-07-06 NOTE — ED Triage Notes (Signed)
Pt sts that he has been having left sided neck pain that is shooting down to his left arm.

## 2023-07-06 NOTE — Discharge Instructions (Addendum)
You can take the Percocet every 6 hours as needed for pain.  Please follow-up with your orthopedic doctor.

## 2023-07-06 NOTE — ED Provider Notes (Addendum)
Shared visit  Past medical history significant for neck pain.  Patient endorses new weakness to his left hand.  Severe pain.  Scheduled to have an MRI next week but states that the weakness is new.  Plan for MRI to further evaluate for compression syndrome.  Given IV morphine.  Offered trigger point injection.  MRI negative.  Plan to discharge home with outpatient follow-up.   Corena Herter, MD 07/06/23 6440    Corena Herter, MD 07/06/23 2202

## 2023-07-10 DIAGNOSIS — M5412 Radiculopathy, cervical region: Secondary | ICD-10-CM | POA: Diagnosis not present

## 2023-07-10 DIAGNOSIS — R200 Anesthesia of skin: Secondary | ICD-10-CM | POA: Diagnosis not present

## 2023-07-10 DIAGNOSIS — M542 Cervicalgia: Secondary | ICD-10-CM | POA: Diagnosis not present

## 2023-07-15 ENCOUNTER — Telehealth: Payer: Self-pay | Admitting: Family Medicine

## 2023-07-15 ENCOUNTER — Emergency Department
Admission: EM | Admit: 2023-07-15 | Discharge: 2023-07-15 | Disposition: A | Discharge status: Home / Self Care | Payer: Medicaid Other | Attending: Emergency Medicine | Admitting: Emergency Medicine

## 2023-07-15 DIAGNOSIS — M542 Cervicalgia: Secondary | ICD-10-CM | POA: Diagnosis not present

## 2023-07-15 DIAGNOSIS — M5412 Radiculopathy, cervical region: Secondary | ICD-10-CM

## 2023-07-15 DIAGNOSIS — M25512 Pain in left shoulder: Secondary | ICD-10-CM | POA: Diagnosis not present

## 2023-07-15 DIAGNOSIS — J4530 Mild persistent asthma, uncomplicated: Secondary | ICD-10-CM | POA: Diagnosis not present

## 2023-07-15 MED ORDER — MORPHINE SULFATE (PF) 4 MG/ML IV SOLN
4.0000 mg | Freq: Once | INTRAVENOUS | Status: AC
  Administered 2023-07-15: 4 mg via INTRAMUSCULAR
  Filled 2023-07-15: qty 1

## 2023-07-15 MED ORDER — DEXAMETHASONE SODIUM PHOSPHATE 10 MG/ML IJ SOLN
10.0000 mg | Freq: Once | INTRAMUSCULAR | Status: AC
  Administered 2023-07-15: 10 mg via INTRAMUSCULAR
  Filled 2023-07-15: qty 1

## 2023-07-15 MED ORDER — MORPHINE SULFATE (PF) 4 MG/ML IV SOLN: 4.0000 mg | Freq: Once | INTRAVENOUS | Status: DC

## 2023-07-15 MED ORDER — OXYCODONE-ACETAMINOPHEN 10-325 MG PO TABS
1.0000 | ORAL_TABLET | ORAL | 0 refills | Status: AC | PRN
Start: 2023-07-15 — End: 2023-07-20

## 2023-07-15 NOTE — ED Provider Notes (Signed)
[facility] Provider Note    Event Date/Time   First MD Initiated Contact with Patient 07/15/23 1232     (approximate)   History   Shoulder Pain   HPI  Jeffery King is a 47 y.o. male with a past medical history of cervical radiculopathy who presents today for evaluation of neck and shoulder pain.  Patient reports that this has been ongoing for quite a while.  He reports that he has pain today again.  He reports "I want answers."  He denies any new symptoms today.  He reports that he has seen his orthopedist multiple times and "he is not taking me seriously."  He denies any new injuries.  No fevers or chills.  Patient Active Problem List   Diagnosis Date Noted   Bilateral upper abdominal discomfort 09/17/2017   Environmental and seasonal allergies 02/20/2017   Vitamin D insufficiency 02/20/2017   Gastroesophageal reflux disease without esophagitis 08/01/2016   Anxiety 03/28/2016   Mild persistent asthma 03/28/2016   Degenerative disc disease, cervical 05/18/2014          Physical Exam   Triage Vital Signs: ED Triage Vitals  Encounter Vitals Group     BP 07/15/23 1151 (!) 156/110     Systolic BP Percentile --      Diastolic BP Percentile --      Pulse Rate 07/15/23 1151 85     Resp 07/15/23 1151 20     Temp 07/15/23 1151 97.9 F (36.6 C)     Temp Source 07/15/23 1151 Oral     SpO2 07/15/23 1151 100 %     Weight --      Height --      Head Circumference --      Peak Flow --      Pain Score 07/15/23 1155 10     Pain Loc --      Pain Education --      Exclude from Growth Chart --     Most recent vital signs: Vitals:   07/15/23 1151  BP: (!) 156/110  Pulse: 85  Resp: 20  Temp: 97.9 F (36.6 C)  SpO2: 100%    Physical Exam Vitals and nursing note reviewed.  Constitutional:      General: Awake and alert. No acute distress.    Appearance: Normal appearance. The patient is normal weight.  HENT:     Head: Normocephalic and  atraumatic.     Mouth: Mucous membranes are moist.  Eyes:     General: PERRL. Normal EOMs        Right eye: No discharge.        Left eye: No discharge.     Conjunctiva/sclera: Conjunctivae normal.  Cardiovascular:     Rate and Rhythm: Normal rate and regular rhythm.     Pulses: Normal pulses.  Pulmonary:     Effort: Pulmonary effort is normal. No respiratory distress.     Breath sounds: Normal breath sounds.  Abdominal:     Abdomen is soft. There is no abdominal tenderness. No rebound or guarding. No distention. Musculoskeletal:        General: No swelling. Normal range of motion.     Cervical back: Normal range of motion and neck supple.  Skin:    General: Skin is warm and dry.     Capillary Refill: Capillary refill takes less than 2 seconds.     Findings: No rash.  Neurological:     Mental Status: The  patient is awake and alert.      ED Results / Procedures / Treatments   Labs (all labs ordered are listed, but only abnormal results are displayed) Labs Reviewed - No data to display   EKG     RADIOLOGY     PROCEDURES:  Critical Care performed:   Procedures   MEDICATIONS ORDERED IN ED: Medications  dexamethasone (DECADRON) injection 10 mg (10 mg Intramuscular Given 07/15/23 1254)  morphine (PF) 4 MG/ML injection 4 mg (4 mg Intramuscular Given 07/15/23 1326)     IMPRESSION / MDM / ASSESSMENT AND PLAN / ED COURSE  I reviewed the triage vital signs and the nursing notes.   Differential diagnosis includes, but is not limited to, cervical radiculopathy, calcific tendinitis, complex regional pain syndrome.  I reviewed the patient's chart.  Patient was seen on 07/06/2023 with the same complaint and had an MRI at that time which was unremarkable.  He follows with [facility], Dr. Carmon King who he most recently saw on 07/10/2023.  It was recommended that he initiate physical therapy.  They also discussed obtaining an EMG, though they had no plans for surgical  intervention after the results of the MRI.  Patient has also seen Dr. Carmon King on 07/02/2023 and 07/03/2023.  He has been treated with a prednisone Dosepak, Robaxin, and also "anxiety medicine."  Patient is awake and alert, hemodynamically stable and afebrile.  He is tearful and anxious on arrival which per chart review appears to be typical for him.  He is wearing his sling.  He has normal intrinsic muscle function of his hand.  He has normal grip strength.  There are no obvious external abnormalities.  His sensation is intact.  Offered x-ray imaging given that he recently had an MRI, though he declined.  He was given Decadron and morphine with improvement of his symptoms.  I recommended that he follow-up with his orthopedist as scheduled, and also undergo the EMG and PT that has been scheduled for him.  I reviewed PDMP, patient has had multiple prescriptions for narcotics, therefore I will not be refilling these today.  We discussed return precautions and outpatient follow-up.  Patient understands and agrees with plan.  He was discharged in stable condition.  07/07/2023 07/06/2023  1 Oxycodone-Acetaminophen 10-325 20.00 5 [name] [id number] Thr (4878) 0/0 60.00 MME Medicaid [place]  07/04/2023 07/04/2023  1 Oxycodone Hcl (Ir) 5 Mg Tablet 15.00 5 [name] [id number] Thr (4878) 0/0 22.50 MME Medicaid [place]  07/02/2023 07/02/2023  1 Hydrocodone-Acetamin 5-325 Mg 5.00 5 [name] [id number] Thr (4878) 0/0 5.00 MME Medicaid [place]    Patient's presentation is most consistent with severe exacerbation of chronic illness.   FINAL CLINICAL IMPRESSION(S) / ED DIAGNOSES   Final diagnoses:  Cervical radiculopathy     Rx / DC Orders   ED Discharge Orders     None        Note:  This document was prepared using Dragon voice recognition software and may include unintentional dictation errors.   Jackelyn Hoehn, PA-C 07/15/23 1545    Merwyn Katos, MD 07/16/23 [phone]

## 2023-07-15 NOTE — ED Notes (Signed)
Pt to flex with L shoulder pain for 13 days. Pt is restless and tearful on assessment.

## 2023-07-15 NOTE — Discharge Instructions (Signed)
Please follow-up with your orthopedist.  Please return for any new, worsening, or change in symptoms or other concerns.  It was a pleasure caring for you today.

## 2023-07-15 NOTE — ED Triage Notes (Signed)
Pt arrives via [facility] from home for neck and L forearm pain. Denies new injury.

## 2023-07-15 NOTE — ED Triage Notes (Signed)
First Nurse Note;  Pt via [facility] from home. Pt c/o neck and L arm pain has surgery scheduled with [facility], states his percocet is not longer working. Pt is A&Ox4 and NAD, ambulatory to triage.

## 2023-07-15 NOTE — Telephone Encounter (Signed)
Patient called about scheduling ED / HFU apt and pain med refill for his neck pain cervical radiculopathy issue. He has seen Orthopedics and has had imaging, they are not able to provide pain management long term. They are not opting for surgery at this time. He would like to discuss pain management options next and referral if need. I agreed to re order the Percocet 10-325 that worked for him, last fill 07/07/23 reviewed PDMP, he takes 1-2 per day. I ordered rx tonight and scheduled him for 9/30 with me  [name], DO [facility] 07/15/2023, 5:42 PM

## 2023-07-15 NOTE — Telephone Encounter (Signed)
Patient said he knows Dr Kirtland Bouchard personally and request that he call him back in reference to his condition. No other information was provided.

## 2023-07-21 ENCOUNTER — Telehealth (INDEPENDENT_AMBULATORY_CARE_PROVIDER_SITE_OTHER): Payer: Medicaid Other | Admitting: Family Medicine

## 2023-07-21 ENCOUNTER — Encounter: Payer: Self-pay | Admitting: Family Medicine

## 2023-07-21 DIAGNOSIS — M5412 Radiculopathy, cervical region: Secondary | ICD-10-CM

## 2023-07-21 MED ORDER — METHOCARBAMOL 500 MG PO TABS
500.0000 mg | ORAL_TABLET | Freq: Three times a day (TID) | ORAL | 1 refills | Status: AC | PRN
Start: 2023-07-21 — End: ?

## 2023-07-21 MED ORDER — OXYCODONE-ACETAMINOPHEN 10-325 MG PO TABS
1.0000 | ORAL_TABLET | ORAL | 0 refills | Status: DC | PRN
Start: 2023-07-21 — End: 2023-07-26

## 2023-07-21 NOTE — Patient Instructions (Addendum)
Thank you for coming to the office today.    Please schedule a Follow-up Appointment to: Return if symptoms worsen or fail to improve.  If you have any other questions or concerns, please feel free to call the office or send a message through [place]. You may also schedule an earlier appointment if necessary.  Additionally, you may be receiving a survey about your experience at our office within a few days to 1 week by e-mail or mail. We value your feedback.  [name], DO [place]

## 2023-07-21 NOTE — Progress Notes (Signed)
Subjective:    Patient ID: Jeffery King, male    DOB: January 14, 1976, 47 y.o.   MRN: [id number]  Jeffery King is a 47 y.o. male presenting on 07/21/2023 for No chief complaint on file.  Virtual / Telehealth Encounter - Video Visit via MyChart The purpose of this virtual visit is to provide medical care while limiting exposure to the novel coronavirus (COVID19) for both patient and office staff.  Consent was obtained for remote visit:  Yes.   Answered questions that patient had about telehealth interaction:  Yes.   I discussed the limitations, risks, security and privacy concerns of performing an evaluation and management service by video/telephone. I also discussed with the patient that there may be a patient responsible charge related to this service. The patient expressed understanding and agreed to proceed.  Patient Location: Home Provider Location: [facility] (Office)  Participants in virtual visit: - Patient: Jeffery King - CMA: Kavin Leech CMA - Provider: Dr Althea Charon   HPI  Discussed the use of AI scribe software for clinical note transcription with the patient, who gave verbal consent to proceed.  History of Present Illness   The patient, with a history of cervical radiculopathy  He thinks it is still persistent radiculopathy, numbness fingers and thumb, he believes that it is more nerve pinched symptoms.  He has been managing their condition with Percocet since our recent phone call. He is taking an average of two to three tablets per day. They report that sleep can be challenging due to the need to maintain a specific position to alleviate neck pain, which can trigger flares of their condition. Despite this, they have not sought surgical intervention and are interested in exploring other pain management options, including cortisone injections.  He says pain has improved overall and hopeful that it will further reduce within 1 more week of medicine.  The  patient has previously seen an orthopedic specialist at [facility], but they were not managing the pain and did not recommend surgery. May need updated diagnostic work up with EMG or MRI imaging. The patient is now considering a referral to a physiatrist for potential injections and medication management. They have also been taking methocarbamol, a muscle relaxant, three times a day, which they find to be helpful  Goal to do Physical therapy.      03/20/2023    9:40 AM 01/02/2023   11:12 AM 03/27/2022    9:11 AM  Depression screen PHQ 2/9  Decreased Interest 2 1 2   Down, Depressed, Hopeless 2 0 0  PHQ - 2 Score 4 1 2   Altered sleeping 2 1 0  Tired, decreased energy 2 1 1   Change in appetite 1 1 1   Feeling bad or failure about yourself  1 1 0  Trouble concentrating 1 2 0  Moving slowly or fidgety/restless 0 1 0  Suicidal thoughts 0 0 0  PHQ-9 Score 11 8 4   Difficult doing work/chores  Somewhat difficult Not difficult at all    Social History   Tobacco Use   Smoking status: Former    Current packs/day: 0.00    Types: Cigarettes    Quit date: 05/30/2014    Years since quitting: 9.1   Smokeless tobacco: Former  Building services engineer status: Never Used  Substance Use Topics   Alcohol use: No   Drug use: No    Review of Systems Per HPI unless specifically indicated above  Objective:    There were no vitals taken for this visit.  Wt Readings from Last 3 Encounters:  07/06/23 175 lb (79.4 kg)  05/12/23 177 lb (80.3 kg)  05/09/23 175 lb (79.4 kg)    Physical Exam  Note examination was completely remotely via video observation objective data only  Gen - well-appearing, no acute distress or apparent pain, comfortable HEENT - eyes appear clear without discharge or redness Heart/Lungs - cannot examine virtually - observed no evidence of coughing or labored breathing. Abd - cannot examine virtually  Skin - face visible today- no rash Neuro - awake, alert,  oriented Psych - not anxious appearing    Results for orders placed or performed in visit on 05/12/23  POC COVID-19  Result Value Ref Range   SARS Coronavirus 2 Ag Negative Negative  POCT rapid strep A  Result Value Ref Range   Rapid Strep A Screen Negative Negative      Assessment & Plan:   Problem List Items Addressed This Visit   None Visit Diagnoses     Cervical radiculopathy    -  Primary   Relevant Medications   methocarbamol (ROBAXIN) 500 MG tablet   oxyCODONE-acetaminophen (PERCOCET) 10-325 MG tablet   Other Relevant Orders   Ambulatory referral to Physical Medicine Rehab       Assessment and Plan    Cervical Radiculopathy Chronic neck pain with flares, currently managed with Percocet 2-3 times daily and Methocarbamol 500mg  three times daily. Discussed the role of sleep and positioning in exacerbating symptoms. Patient interested in cortisone injections for pain management. -Continue current medication regimen. -Refer to [facility] for evaluation and potential cortisone injection ESI for C spine and Physical therapy orders  Re order Methocarbamol THREE TIMES A DAY dosing Re order Percocet 10/325 for short term use, we discussed that if need longer term, may be able to work with Physiatry for pain management or would need separate referral  Possible Thoracic Outlet Syndrome but advised would need further diagnostic work up    Orders Placed This Encounter  Procedures   Ambulatory referral to Physical Medicine Rehab    Referral Priority:   Routine    Referral Type:   Rehabilitation    Referral Reason:   Specialty Services Required    Requested Specialty:   Physical Medicine and Rehabilitation    Number of Visits Requested:   1     Meds ordered this encounter  Medications   methocarbamol (ROBAXIN) 500 MG tablet    Sig: Take 1 tablet (500 mg total) by mouth every 8 (eight) hours as needed for muscle spasms.    Dispense:  270 tablet     Refill:  1   oxyCODONE-acetaminophen (PERCOCET) 10-325 MG tablet    Sig: Take 1 tablet by mouth every 4 (four) hours as needed for pain.    Dispense:  30 tablet    Refill:  0    ref Follow up plan: Return if symptoms worsen or fail to improve.  Patient verbalizes understanding with the above medical recommendations including the limitation of remote medical advice.  Specific follow-up and call-back criteria were given for patient to follow-up or seek medical care more urgently if needed.  Total duration of direct patient care provided via video conference: 15 minutes    [name], DO [facility] 07/21/2023, 12:02 PM

## 2023-07-24 DIAGNOSIS — M5030 Other cervical disc degeneration, unspecified cervical region: Secondary | ICD-10-CM | POA: Diagnosis not present

## 2023-07-24 DIAGNOSIS — M5412 Radiculopathy, cervical region: Secondary | ICD-10-CM | POA: Diagnosis not present

## 2023-07-26 ENCOUNTER — Other Ambulatory Visit: Payer: Self-pay | Admitting: Family Medicine

## 2023-07-26 DIAGNOSIS — M5412 Radiculopathy, cervical region: Secondary | ICD-10-CM

## 2023-07-28 MED ORDER — OXYCODONE-ACETAMINOPHEN 10-325 MG PO TABS
1.0000 | ORAL_TABLET | ORAL | 0 refills | Status: DC | PRN
Start: 2023-07-28 — End: 2023-08-03

## 2023-08-03 ENCOUNTER — Other Ambulatory Visit: Payer: Self-pay | Admitting: Family Medicine

## 2023-08-03 DIAGNOSIS — J4530 Mild persistent asthma, uncomplicated: Secondary | ICD-10-CM

## 2023-08-03 DIAGNOSIS — M5412 Radiculopathy, cervical region: Secondary | ICD-10-CM

## 2023-08-04 MED ORDER — OXYCODONE-ACETAMINOPHEN 10-325 MG PO TABS
1.0000 | ORAL_TABLET | ORAL | 0 refills | Status: DC | PRN
Start: 2023-08-04 — End: 2023-08-12

## 2023-08-04 MED ORDER — VENTOLIN HFA 108 (90 BASE) MCG/ACT IN AERS
1.0000 | INHALATION_SPRAY | RESPIRATORY_TRACT | 3 refills | Status: DC | PRN
Start: 2023-08-04 — End: 2023-10-30

## 2023-08-12 ENCOUNTER — Other Ambulatory Visit: Payer: Self-pay | Admitting: Family Medicine

## 2023-08-12 DIAGNOSIS — M5412 Radiculopathy, cervical region: Secondary | ICD-10-CM

## 2023-08-12 MED ORDER — OXYCODONE-ACETAMINOPHEN 10-325 MG PO TABS
1.0000 | ORAL_TABLET | ORAL | 0 refills | Status: DC | PRN
Start: 2023-08-12 — End: 2023-08-20

## 2023-08-20 ENCOUNTER — Other Ambulatory Visit: Payer: Self-pay | Admitting: Family Medicine

## 2023-08-20 DIAGNOSIS — M5412 Radiculopathy, cervical region: Secondary | ICD-10-CM

## 2023-08-20 MED ORDER — OXYCODONE-ACETAMINOPHEN 10-325 MG PO TABS
1.0000 | ORAL_TABLET | ORAL | 0 refills | Status: DC | PRN
Start: 2023-08-20 — End: 2023-08-29

## 2023-08-29 ENCOUNTER — Other Ambulatory Visit: Payer: Self-pay | Admitting: Family Medicine

## 2023-08-29 DIAGNOSIS — M5412 Radiculopathy, cervical region: Secondary | ICD-10-CM

## 2023-08-29 MED ORDER — OXYCODONE-ACETAMINOPHEN 10-325 MG PO TABS
1.0000 | ORAL_TABLET | ORAL | 0 refills | Status: DC | PRN
Start: 2023-08-29 — End: 2023-09-08

## 2023-09-08 ENCOUNTER — Other Ambulatory Visit: Payer: Self-pay | Admitting: Family Medicine

## 2023-09-08 DIAGNOSIS — M5412 Radiculopathy, cervical region: Secondary | ICD-10-CM

## 2023-09-08 MED ORDER — OXYCODONE-ACETAMINOPHEN 10-325 MG PO TABS
1.0000 | ORAL_TABLET | ORAL | 0 refills | Status: DC | PRN
Start: 2023-09-08 — End: 2023-09-17

## 2023-09-17 ENCOUNTER — Other Ambulatory Visit: Payer: Self-pay | Admitting: Family Medicine

## 2023-09-17 DIAGNOSIS — M5412 Radiculopathy, cervical region: Secondary | ICD-10-CM

## 2023-09-17 MED ORDER — OXYCODONE-ACETAMINOPHEN 10-325 MG PO TABS
1.0000 | ORAL_TABLET | ORAL | 0 refills | Status: DC | PRN
Start: 2023-09-17 — End: 2023-09-30

## 2023-09-30 ENCOUNTER — Other Ambulatory Visit: Payer: Self-pay | Admitting: Family Medicine

## 2023-09-30 DIAGNOSIS — M5412 Radiculopathy, cervical region: Secondary | ICD-10-CM

## 2023-09-30 MED ORDER — OXYCODONE-ACETAMINOPHEN 10-325 MG PO TABS
1.0000 | ORAL_TABLET | ORAL | 0 refills | Status: DC | PRN
Start: 2023-09-30 — End: 2023-10-13

## 2023-10-13 ENCOUNTER — Other Ambulatory Visit: Payer: Self-pay | Admitting: Family Medicine

## 2023-10-13 DIAGNOSIS — M5412 Radiculopathy, cervical region: Secondary | ICD-10-CM

## 2023-10-13 MED ORDER — OXYCODONE-ACETAMINOPHEN 10-325 MG PO TABS
1.0000 | ORAL_TABLET | ORAL | 0 refills | Status: DC | PRN
Start: 2023-10-13 — End: 2023-10-26

## 2023-10-26 ENCOUNTER — Other Ambulatory Visit: Payer: Self-pay | Admitting: Family Medicine

## 2023-10-26 DIAGNOSIS — M5412 Radiculopathy, cervical region: Secondary | ICD-10-CM

## 2023-10-27 MED ORDER — OXYCODONE-ACETAMINOPHEN 10-325 MG PO TABS
1.0000 | ORAL_TABLET | ORAL | 0 refills | Status: DC | PRN
Start: 2023-10-27 — End: 2023-11-07

## 2023-10-28 ENCOUNTER — Other Ambulatory Visit: Payer: Self-pay | Admitting: Family Medicine

## 2023-10-28 DIAGNOSIS — J4530 Mild persistent asthma, uncomplicated: Secondary | ICD-10-CM

## 2023-10-30 NOTE — Telephone Encounter (Signed)
 Requested Prescriptions  Pending Prescriptions Disp Refills   VENTOLIN  HFA 108 (90 Base) MCG/ACT inhaler [Pharmacy Med Name: VENTOLIN  HFA 108 (90 BASE) MCG/ACT] 18 g 3    Sig: INHALE 1 TO 2 PUFFS INTO LUNGS EVERY 4 HOURS AS NEEDED FOR WHEEZING OR SHORTNESS OF BREATH     Pulmonology:  Beta Agonists 2 Failed - 10/30/2023  1:00 PM      Failed - Last BP in normal range    BP Readings from Last 1 Encounters:  07/15/23 (!) 156/110         Passed - Last Heart Rate in normal range    Pulse Readings from Last 1 Encounters:  07/15/23 85         Passed - Valid encounter within last 12 months    Recent Outpatient Visits           3 months ago Cervical radiculopathy   [place] [facility] [place], Marsa PARAS, DO   4 months ago Benign paroxysmal positional vertigo, unspecified laterality   [place] [facility] [place], Marsa PARAS, DO   5 months ago Acute non intractable tension-type headache   [facility] [place], Angeline ORN, NP   7 months ago Anxiety   [facility] [place] Marsa PARAS, DO   10 months ago Anxiety   [facility] [place], Marsa PARAS, [place]

## 2023-11-07 ENCOUNTER — Other Ambulatory Visit: Payer: Self-pay | Admitting: Family Medicine

## 2023-11-07 DIAGNOSIS — M5412 Radiculopathy, cervical region: Secondary | ICD-10-CM

## 2023-11-07 MED ORDER — OXYCODONE-ACETAMINOPHEN 10-325 MG PO TABS
1.0000 | ORAL_TABLET | ORAL | 0 refills | Status: DC | PRN
Start: 2023-11-07 — End: 2023-11-17

## 2023-11-07 NOTE — Telephone Encounter (Addendum)
Medication Refill -  Most Recent Primary Care Visit:  Provider: Smitty Cords  Department: [facility]  Visit Type: MYCHART VIDEO VISIT  Date: 06/26/2023  Medication: oxyCODONE-acetaminophen (PERCOCET) 10-325 MG tablet   Has the patient contacted their pharmacy? No Pt gets refill every 10 days  Is this the correct pharmacy for this prescription? Yes If no, delete pharmacy and type the correct one.  This is the patient's preferred pharmacy:  [facility] - [place], [place] - [address] Reesa Che[address] [place] [address] Phone: [phone] Fax: [phone]   Has the prescription been filled recently? Yes  Is the patient out of the medication? Yes  Has the patient been seen for an appointment in the last year OR does the patient have an upcoming appointment? Yes  Can we respond through MyChart? No *Pt states he is going to have to look at his schedule and call back to make an appt.

## 2023-11-07 NOTE — Telephone Encounter (Signed)
Requested medication (s) are due for refill today:   Requested medication (s) are on the active medication list: Yes  Last refill:  10/27/23  Future visit scheduled: No  Notes to clinic:  Not delegated.    Requested Prescriptions  Pending Prescriptions Disp Refills   oxyCODONE-acetaminophen (PERCOCET) 10-325 MG tablet 30 tablet 0    Sig: Take 1 tablet by mouth every 4 (four) hours as needed for pain.     Not Delegated - Analgesics:  Opioid Agonist Combinations Failed - 11/07/2023  2:02 PM      Failed - This refill cannot be delegated      Failed - Urine Drug Screen completed in last 360 days      Failed - Valid encounter within last 3 months    Recent Outpatient Visits           3 months ago Cervical radiculopathy   [place] [facility] [place], Netta Neat, DO   4 months ago Benign paroxysmal positional vertigo, unspecified laterality   [facility] [place], Netta Neat, DO   5 months ago Acute non intractable tension-type headache   [place] [facility] [place], Salvadore Oxford, NP   7 months ago Anxiety   [place] [facility] Smitty Cords, DO   10 months ago Anxiety    [facility] [place], Netta Neat, [place]

## 2023-11-17 ENCOUNTER — Other Ambulatory Visit: Payer: Self-pay | Admitting: Family Medicine

## 2023-11-17 DIAGNOSIS — M5412 Radiculopathy, cervical region: Secondary | ICD-10-CM

## 2023-11-17 MED ORDER — OXYCODONE-ACETAMINOPHEN 10-325 MG PO TABS
1.0000 | ORAL_TABLET | ORAL | 0 refills | Status: DC | PRN
Start: 2023-11-17 — End: 2023-11-27

## 2023-11-27 ENCOUNTER — Other Ambulatory Visit: Payer: Self-pay | Admitting: Family Medicine

## 2023-11-27 DIAGNOSIS — M5412 Radiculopathy, cervical region: Secondary | ICD-10-CM

## 2023-11-28 MED ORDER — OXYCODONE-ACETAMINOPHEN 10-325 MG PO TABS
1.0000 | ORAL_TABLET | ORAL | 0 refills | Status: DC | PRN
Start: 2023-11-28 — End: 2023-12-01

## 2023-12-01 ENCOUNTER — Telehealth: Payer: Self-pay | Admitting: Family Medicine

## 2023-12-01 DIAGNOSIS — M5412 Radiculopathy, cervical region: Secondary | ICD-10-CM

## 2023-12-01 MED ORDER — OXYCODONE-ACETAMINOPHEN 10-325 MG PO TABS
1.0000 | ORAL_TABLET | ORAL | 0 refills | Status: DC | PRN
Start: 2023-12-01 — End: 2023-12-10

## 2023-12-01 NOTE — Telephone Encounter (Signed)
 Rx sent to [facility]  Domingo Friend, DO [facility] 12/01/2023, 5:15 PM

## 2023-12-01 NOTE — Telephone Encounter (Signed)
 Medication Refill -  Most Recent Primary Care Visit:  Provider: Raina Bunting  Department: [facility]  Visit Type: MYCHART VIDEO VISIT  Date: 06/26/2023  Medication: oxyCODONE -acetaminophen  (PERCOCET) 10-325 MG tablet   Has the patient contacted their pharmacy? Yes Pt pharmacy is out of medication so he wants it sent to [facility].  Is this the correct pharmacy for this prescription? Yes  This is the patient's preferred pharmacy:    [facility] - [place], [place] - [address] [place] [address] Phone: [phone] Fax: [phone]   Has the prescription been filled recently? No  Is the patient out of the medication? Yes  Has the patient been seen for an appointment in the last year OR does the patient have an upcoming appointment? Yes  Can we respond through MyChart? Yes  Agent: Please be advised that Rx refills may take up to 3 business days. We ask that you follow-up with your pharmacy.

## 2023-12-10 ENCOUNTER — Other Ambulatory Visit: Payer: Self-pay | Admitting: Family Medicine

## 2023-12-10 DIAGNOSIS — M5412 Radiculopathy, cervical region: Secondary | ICD-10-CM

## 2023-12-11 ENCOUNTER — Other Ambulatory Visit: Payer: Self-pay | Admitting: Family Medicine

## 2023-12-11 DIAGNOSIS — M5412 Radiculopathy, cervical region: Secondary | ICD-10-CM

## 2023-12-11 MED ORDER — OXYCODONE-ACETAMINOPHEN 10-325 MG PO TABS: 1.0000 | ORAL_TABLET | ORAL | 0 refills | Status: DC | PRN

## 2023-12-12 NOTE — Telephone Encounter (Signed)
Requested medication (s) are due for refill today: no  Requested medication (s) are on the active medication list: yes  Last refill:  12/11/23  Future visit scheduled: yes  Notes to clinic:  Unable to refill per protocol, cannot delegate. Unable to refuse, last refill 12/11/23      Requested Prescriptions  Pending Prescriptions Disp Refills   oxyCODONE-acetaminophen (PERCOCET) 10-325 MG tablet [Pharmacy Med Name: OXYCODONE-APAP 10-325 MG TAB] 30 tablet     Sig: TAKE 1 TABLET BY MOUTH EVERY 4 HOURS AS NEEDED FOR PAIN     Not Delegated - Analgesics:  Opioid Agonist Combinations Failed - 12/12/2023  8:40 AM      Failed - This refill cannot be delegated      Failed - Urine Drug Screen completed in last 360 days      Failed - Valid encounter within last 3 months    Recent Outpatient Visits           4 months ago Cervical radiculopathy   [place] [facility] [place], Netta Neat, DO   5 months ago Benign paroxysmal positional vertigo, unspecified laterality   [facility] [place], Netta Neat, DO   7 months ago Acute non intractable tension-type headache   [place] [facility] [place], Salvadore Oxford, NP   8 months ago Anxiety   [place] [facility] Smitty Cords, [place]   11 months ago Anxiety   [place] [place], Netta Neat, [place]

## 2023-12-21 ENCOUNTER — Other Ambulatory Visit: Payer: Self-pay | Admitting: Family Medicine

## 2023-12-21 DIAGNOSIS — M5412 Radiculopathy, cervical region: Secondary | ICD-10-CM

## 2023-12-22 ENCOUNTER — Other Ambulatory Visit: Payer: Self-pay | Admitting: Family Medicine

## 2023-12-22 DIAGNOSIS — M5412 Radiculopathy, cervical region: Secondary | ICD-10-CM

## 2023-12-22 DIAGNOSIS — J4530 Mild persistent asthma, uncomplicated: Secondary | ICD-10-CM

## 2023-12-22 MED ORDER — OXYCODONE-ACETAMINOPHEN 10-325 MG PO TABS: 1.0000 | ORAL_TABLET | ORAL | 0 refills | Status: DC | PRN

## 2023-12-22 NOTE — Telephone Encounter (Signed)
 Copied from CRM [id number]. Topic: Clinical - Medication Refill >> Dec 22, 2023  3:46 PM Dennison Nancy wrote: Most Recent Primary Care Visit:  Provider: Smitty Cords  Department: [facility]  Visit Type: MYCHART VIDEO VISIT  Date: 06/26/2023  Medication: VENTOLIN HFA 108 (90 Base) MCG/ACT inhaler,oxyCODONE-acetaminophen (PERCOCET) 10-325 MG tablet  Has the patient contacted their pharmacy? Yes (Agent: If no, request that the patient contact the pharmacy for the refill. If patient does not wish to contact the pharmacy document the reason why and proceed with request.) (Agent: If yes, when and what did the pharmacy advise?)  Is this the correct pharmacy for this prescription? Yes If no, delete pharmacy and type the correct one.  This is the patient's preferred pharmacy:  [facility] - [place], [place] - [address] [place] [address] Phone: [phone] Fax: [phone]   Has the prescription been filled recently? Yes   Is the patient out of the medication? Yes  , patient is out of the oxycodone and need soon as possible   Has the patient been seen for an appointment in the last year OR does the patient have an upcoming appointment? Yes  Can we respond through MyChart? Yes and give a call also   Agent: Please be advised that Rx refills may take up to 3 business days. We ask that you follow-up with your pharmacy.

## 2023-12-23 NOTE — Telephone Encounter (Signed)
 Oxycodone was sent on 12/22/23 in a separate refill encounter.

## 2023-12-23 NOTE — Telephone Encounter (Signed)
 Requested medication (s) are due for refill today: No  Requested medication (s) are on the active medication list: Yes  Last refill:  12/22/23  Future visit scheduled: Yes  Notes to clinic:  Not delegated.    Requested Prescriptions  Pending Prescriptions Disp Refills   oxyCODONE-acetaminophen (PERCOCET) 10-325 MG tablet [Pharmacy Med Name: OXYCODONE-APAP 10-325 MG TAB] 30 tablet     Sig: TAKE 1 TABLET BY MOUTH EVERY 4 HOURS AS NEEDED FOR PAIN     There is no refill protocol information for this order

## 2023-12-23 NOTE — Telephone Encounter (Signed)
 VENTOLIN HFA 108 (90 Base) MCG/ACT inhaler 18 g 3 10/30/2023 --   Sig: INHALE 1 TO 2 PUFFS INTO LUNGS EVERY 4 HOURS AS NEEDED FOR WHEEZING OR SHORTNESS OF BREATH   Sent to pharmacy as: VENTOLIN HFA 108 (90 Base) MCG/ACT inhaler   Notes to Pharmacy: FOR NEXT FILL   E-Prescribing Status: Receipt confirmed by pharmacy (10/30/2023  1:01 PM EST)    Requested Prescriptions  Pending Prescriptions Disp Refills   VENTOLIN HFA 108 (90 Base) MCG/ACT inhaler 18 g 3     Pulmonology:  Beta Agonists 2 Failed - 12/23/2023  4:57 PM      Failed - Last BP in normal range    BP Readings from Last 1 Encounters:  07/15/23 (!) 156/110         Passed - Last Heart Rate in normal range    Pulse Readings from Last 1 Encounters:  07/15/23 85         Passed - Valid encounter within last 12 months    Recent Outpatient Visits           5 months ago Cervical radiculopathy   [facility] [place], Netta Neat, DO   6 months ago Benign paroxysmal positional vertigo, unspecified laterality   [facility] [place], Netta Neat, DO   7 months ago Acute non intractable tension-type headache   [place] [facility] [place], Salvadore Oxford, NP   9 months ago Anxiety   [place] [facility] Smitty Cords, [place]   11 months ago Anxiety   [place] [facility] [place], Netta Neat, [place]

## 2023-12-26 ENCOUNTER — Ambulatory Visit
Admission: RE | Admit: 2023-12-26 | Discharge: 2023-12-26 | Disposition: A | Discharge status: Home / Self Care | Source: Ambulatory Visit | Attending: Family Medicine | Admitting: Family Medicine

## 2023-12-26 ENCOUNTER — Other Ambulatory Visit: Payer: Self-pay | Admitting: Family Medicine

## 2023-12-26 DIAGNOSIS — M4802 Spinal stenosis, cervical region: Secondary | ICD-10-CM

## 2023-12-26 DIAGNOSIS — M5380 Other specified dorsopathies, site unspecified: Secondary | ICD-10-CM | POA: Diagnosis not present

## 2023-12-26 DIAGNOSIS — R6883 Chills (without fever): Secondary | ICD-10-CM | POA: Diagnosis not present

## 2023-12-26 DIAGNOSIS — M79601 Pain in right arm: Secondary | ICD-10-CM | POA: Diagnosis not present

## 2023-12-26 DIAGNOSIS — R61 Generalized hyperhidrosis: Secondary | ICD-10-CM | POA: Diagnosis not present

## 2023-12-26 DIAGNOSIS — M5412 Radiculopathy, cervical region: Secondary | ICD-10-CM | POA: Diagnosis not present

## 2023-12-26 DIAGNOSIS — M542 Cervicalgia: Secondary | ICD-10-CM | POA: Diagnosis not present

## 2023-12-26 DIAGNOSIS — M79602 Pain in left arm: Secondary | ICD-10-CM | POA: Diagnosis not present

## 2024-01-01 ENCOUNTER — Telehealth: Payer: Self-pay

## 2024-01-01 ENCOUNTER — Other Ambulatory Visit: Payer: Self-pay | Admitting: Family Medicine

## 2024-01-01 DIAGNOSIS — M5412 Radiculopathy, cervical region: Secondary | ICD-10-CM

## 2024-01-01 NOTE — Telephone Encounter (Signed)
 Refill request sent back to provider.

## 2024-01-01 NOTE — Telephone Encounter (Signed)
 Copied from CRM [id number]. Topic: Clinical - Medication Refill >> Jan 01, 2024  4:00 PM Gery Pray wrote: Most Recent Primary Care Visit:  Provider: Smitty Cords  Department: [facility]  Visit Type: MYCHART VIDEO VISIT  Date: 06/26/2023  Medication: oxyCODONE-acetaminophen (PERCOCET) 10-325 MG tablet  Has the patient contacted their pharmacy? Yes (Agent: If no, request that the patient contact the pharmacy for the refill. If patient does not wish to contact the pharmacy document the reason why and proceed with request.) (Agent: If yes, when and what did the pharmacy advise?) Advised to call provider  Is this the correct pharmacy for this prescription? Yes If no, delete pharmacy and type the correct one.  This is the patient's preferred pharmacy:  [facility] - [place], [place] - [address] [place] [address] Phone: [phone] Fax: [phone]   Has the prescription been filled recently? No  Is the patient out of the medication? No  Has the patient been seen for an appointment in the last year OR does the patient have an upcoming appointment? Yes  Can we respond through MyChart? Yes  Agent: Please be advised that Rx refills may take up to 3 business days. We ask that you follow-up with your pharmacy.

## 2024-01-05 ENCOUNTER — Ambulatory Visit: Admitting: Family Medicine

## 2024-01-06 ENCOUNTER — Encounter: Payer: Self-pay | Admitting: Family Medicine

## 2024-01-06 ENCOUNTER — Ambulatory Visit (INDEPENDENT_AMBULATORY_CARE_PROVIDER_SITE_OTHER): Admitting: Family Medicine

## 2024-01-06 VITALS — BP 142/88 | HR 83 | Ht 70.0 in | Wt 157.0 lb

## 2024-01-06 DIAGNOSIS — R6889 Other general symptoms and signs: Secondary | ICD-10-CM

## 2024-01-06 DIAGNOSIS — I10 Essential (primary) hypertension: Secondary | ICD-10-CM | POA: Diagnosis not present

## 2024-01-06 DIAGNOSIS — M5412 Radiculopathy, cervical region: Secondary | ICD-10-CM | POA: Insufficient documentation

## 2024-01-06 MED ORDER — AMLODIPINE BESYLATE 5 MG PO TABS: 5.0000 mg | ORAL_TABLET | Freq: Every day | ORAL | 0 refills | Status: DC

## 2024-01-06 NOTE — Progress Notes (Unsigned)
 Subjective:    Patient ID: Jeffery King, male    DOB: 1976-02-23, 48 y.o.   MRN: [id number]  Jeffery King is a 48 y.o. male presenting on 01/06/2024 for No chief complaint on file.   HPI  Discussed the use of AI scribe software for clinical note transcription with the patient, who gave verbal consent to proceed.  History of Present Illness          ***weight loss 20 lbs  *** Refills Avoid Gabapentin He will pursue therapy Physiatry, anticipating ESI  ***BP   Health Maintenance: ***     01/06/2024    3:43 PM 03/20/2023    9:40 AM 01/02/2023   11:12 AM  Depression screen PHQ 2/9  Decreased Interest 0 2 1  Down, Depressed, Hopeless 0 2 0  PHQ - 2 Score 0 4 1  Altered sleeping 0 2 1  Tired, decreased energy 1 2 1   Change in appetite 0 1 1  Feeling bad or failure about yourself  0 1 1  Trouble concentrating 0 1 2  Moving slowly or fidgety/restless 0 0 1  Suicidal thoughts 0 0 0  PHQ-9 Score 1 11 8   Difficult doing work/chores Somewhat difficult  Somewhat difficult       01/06/2024    3:43 PM 03/20/2023    9:40 AM 01/02/2023   11:13 AM 03/27/2022    9:11 AM  GAD 7 : Generalized Anxiety Score  Nervous, Anxious, on Edge 0 1 2 0  Control/stop worrying 0 1 2 0  Worry too much - different things 0 1 2 0  Trouble relaxing 0 3 2 0  Restless 0 1 2 1   Easily annoyed or irritable 0 2 2 1   Afraid - awful might happen 0 2 2 0  Total GAD 7 Score 0 11 14 2   Anxiety Difficulty   Somewhat difficult Not difficult at all    Social History   Tobacco Use   Smoking status: Former    Current packs/day: 0.00    Types: Cigarettes    Quit date: 05/30/2014    Years since quitting: 9.6   Smokeless tobacco: Former  Building services engineer status: Never Used  Substance Use Topics   Alcohol use: No   Drug use: No    Review of Systems Per HPI unless specifically indicated above     Objective:    BP (!) 140/90 (BP Location: Right Arm)   Pulse 83   Ht 5\' 10"  (1.778 m)   Wt  157 lb (71.2 kg)   SpO2 98%   BMI 22.53 kg/m   Wt Readings from Last 3 Encounters:  01/06/24 157 lb (71.2 kg)  07/06/23 175 lb (79.4 kg)  05/12/23 177 lb (80.3 kg)    Physical Exam  I have personally reviewed the radiology report from 12/26/23 on MRI Cervical.  MR CERVICAL SPINE WO CONTRAST [[id number]] Resulted: 12/26/23 1438  Order Status: Completed Updated: 12/26/23 1440  Narrative:    CLINICAL DATA:  Neck pain.  Bilateral upper extremity pain.  EXAM: MRI CERVICAL SPINE WITHOUT CONTRAST  TECHNIQUE: Multiplanar, multisequence MR imaging of the cervical spine was performed. No intravenous contrast was administered.  COMPARISON:  MRI cervical spine 05/09/2016  FINDINGS: Alignment: No significant listhesis is present. Cervical lordosis is preserved.  Vertebrae: Marrow signal and vertebral body heights are normal.  Cord: Normal signal and morphology.  Posterior Fossa, vertebral arteries, paraspinal tissues: Craniocervical junction is normal. Flow is present in  the vertebral arteries bilaterally. Visualized intracranial contents are normal.  Disc levels:  C2-3: Negative.  C3-4: Mild left foraminal narrowing is secondary to uncovertebral and facet hypertrophy, new since the prior exam.  C4-5: Negative.  C5-6: A broad-based disc osteophyte complex effaces the ventral CSF. Moderate foraminal stenosis bilaterally is similar the prior exam.  C6-7: A broad-based disc osteophyte complex has progressed some since the prior exam. Partial effacement of ventral CSF is present. Mild left foraminal narrowing is present.  C7-T1: Negative.  IMPRESSION: 1. Mild left foraminal narrowing at C3-4 is new since the prior exam. 2. Moderate foraminal stenosis bilaterally at C5-6 is similar the prior exam. 3. Mild left foraminal narrowing at C6-7. 4. Partial effacement of ventral CSF at C5-6 and C6-7, consistent with mild central canal stenosis, greatest at C5-6.   Electronically  Signed   By: Marin Roberts M.D.   On: 12/26/2023 14:38    Results for orders placed or performed in visit on 05/12/23  POC COVID-19   Collection Time: 05/12/23  2:36 PM  Result Value Ref Range   SARS Coronavirus 2 Ag Negative Negative  POCT rapid strep A   Collection Time: 05/12/23  2:36 PM  Result Value Ref Range   Rapid Strep A Screen Negative Negative      Assessment & Plan:   Problem List Items Addressed This Visit   None    Assessment and Plan              No orders of the defined types were placed in this encounter.   No orders of the defined types were placed in this encounter.   Follow up plan: No follow-ups on file.  ***Future labs ordered for ***   [name], DO [facility] 01/06/2024, 4:02 PM

## 2024-01-06 NOTE — Patient Instructions (Addendum)
 Thank you for coming to the office today.  Amlodipine 5mg  daily for BP  Keep track  GOAL < 140 / 90  Keep up with Whitney Meeler on the spine injections  Okay to take the muscle relaxant.  DUE for FASTING BLOOD WORK (no food or drink after midnight before the lab appointment, only water or coffee without cream/sugar on the morning of)  SCHEDULE "Lab Only" visit in the morning at the clinic for lab draw in 4-6 weeks  - Make sure Lab Only appointment is at about 1 week before your next appointment, so that results will be available  For Lab Results, once available within 2-3 days of blood draw, you can can log in to MyChart online to view your results and a brief explanation. Also, we can discuss results at next follow-up visit.   Please schedule a Follow-up Appointment to: Return in about 4 weeks (around 02/03/2024) for 4 weeks fasting lab then 1 week later Annual Physical.  If you have any other questions or concerns, please feel free to call the office or send a message through MyChart. You may also schedule an earlier appointment if necessary.  Additionally, you may be receiving a survey about your experience at our office within a few days to 1 week by e-mail or mail. We value your feedback.  [name], DO [facility] [place]

## 2024-01-07 ENCOUNTER — Other Ambulatory Visit: Payer: Self-pay | Admitting: Family Medicine

## 2024-01-07 DIAGNOSIS — R7309 Other abnormal glucose: Secondary | ICD-10-CM

## 2024-01-07 DIAGNOSIS — Z125 Encounter for screening for malignant neoplasm of prostate: Secondary | ICD-10-CM

## 2024-01-07 DIAGNOSIS — Z0000 Encounter for general adult medical examination without abnormal findings: Secondary | ICD-10-CM

## 2024-01-07 DIAGNOSIS — E7800 Pure hypercholesterolemia, unspecified: Secondary | ICD-10-CM

## 2024-01-07 DIAGNOSIS — I10 Essential (primary) hypertension: Secondary | ICD-10-CM

## 2024-01-07 DIAGNOSIS — E559 Vitamin D deficiency, unspecified: Secondary | ICD-10-CM

## 2024-01-11 ENCOUNTER — Other Ambulatory Visit: Payer: Self-pay | Admitting: Family Medicine

## 2024-01-11 DIAGNOSIS — M5412 Radiculopathy, cervical region: Secondary | ICD-10-CM

## 2024-01-12 ENCOUNTER — Telehealth: Payer: Self-pay

## 2024-01-12 MED ORDER — OXYCODONE-ACETAMINOPHEN 10-325 MG PO TABS: 1.0000 | ORAL_TABLET | ORAL | 0 refills | Status: DC | PRN

## 2024-01-12 NOTE — Telephone Encounter (Signed)
 Jeffery King ([name]) Rx #: [id number] Need Help? Call us at [phone] Status New (Not sent to plan) Drug oxyCODONE-Acetaminophen 10-325MG  tablets ePA cloud logo Form [facility] [place] Electronic [place] Form [phone] NCPDP) Original Claim Info 75 01PLAN LIMITATION EXCEEDED 5 DAYS SUPPLY. 02CALL [phone] OR SUBMIT PA TO WWW. Elvis Coil

## 2024-01-12 NOTE — Telephone Encounter (Signed)
 Please notify patient that I agreed to refill his Oxycodone. I sent a higher pill count for 45 pills instead of 30 pills. He reported to [place] that he is needing 2 pills of the 10mg  dose which = 20mg  oxycodone in PM and in AM.  Please let him know that these Oxycodone pills are not made in 20mg  dose. 10 is the highest dose. Also, if he needs more than 10mg . I normally do not prescribe anything stronger or higher doses. He would now be in the territory he will need a referral to Pain Management doctor to take over the prescription moving forward.  I can keep the current dose if he prefers, but if he is looking for different dosing or other options, we would need to setup a referral. We do not have many or any doctors in town that manage these pain medicines. Usually the offices that prescribe these are in [place], [place] or [place].  Let me know what he says  [name], DO [facility] 01/12/2024, 1:25 PM

## 2024-01-21 ENCOUNTER — Other Ambulatory Visit: Payer: Self-pay | Admitting: Family Medicine

## 2024-01-21 DIAGNOSIS — J3089 Other allergic rhinitis: Secondary | ICD-10-CM

## 2024-01-21 DIAGNOSIS — M5412 Radiculopathy, cervical region: Secondary | ICD-10-CM

## 2024-01-21 MED ORDER — OXYCODONE-ACETAMINOPHEN 10-325 MG PO TABS: 1.0000 | ORAL_TABLET | ORAL | 0 refills | Status: DC | PRN

## 2024-01-21 MED ORDER — LORATADINE 10 MG PO TABS
10.0000 mg | ORAL_TABLET | Freq: Every day | ORAL | 3 refills | Status: AC
Start: 2024-01-21 — End: ?

## 2024-01-22 ENCOUNTER — Telehealth: Payer: Self-pay

## 2024-01-22 NOTE — Telephone Encounter (Signed)
 Jeffery King (KeyJamie Brookes) PA Case ID #: [id number] Rx #: [id number] Need Help? Call us at [phone] Status sent iconSent to Plan today Drug oxyCODONE-Acetaminophen 10-325MG  tablets ePA cloud logo Form [facility] [place] Form [phone] NCPDP) Original Claim Info 75 01PLAN LIMITATION EXCEEDED 5 DAYS SUPPLY. 02CALL [phone] OR SUBMIT PA TO WWW. Elvis Coil

## 2024-01-28 ENCOUNTER — Ambulatory Visit: Payer: Self-pay

## 2024-01-28 ENCOUNTER — Other Ambulatory Visit: Payer: Self-pay | Admitting: Family Medicine

## 2024-01-28 DIAGNOSIS — R21 Rash and other nonspecific skin eruption: Secondary | ICD-10-CM

## 2024-01-28 MED ORDER — SELENIUM SULFIDE 2.5 % EX LOTN: 1.0000 | TOPICAL_LOTION | Freq: Every day | CUTANEOUS | 3 refills | Status: AC | PRN

## 2024-01-28 NOTE — Telephone Encounter (Signed)
 Refill sent.

## 2024-01-28 NOTE — Telephone Encounter (Signed)
  Chief Complaint: Rash and refill Symptoms: rash Frequency: recurring, intermittent Pertinent Negatives: Patient denies new symptoms  Disposition: [] ED /[] Urgent Care (no appt availability in office) / [] Appointment(In office/virtual)/ []  [place] [facility]/ [] Home Care/ [] Refused Recommended Disposition /[] [place] [facility]/ []  Follow-up with PCP Additional Notes:  Rash is same as previous rashes. 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 Rash is worse when working outside or when feeling warm. Uses Selenium sulfide 2.5% lotion and has worked well previously, the last rash took a bit longer to clear than usual. Today has rash on his chest, neck, and under arms, new flair on abdomen after working outside. Requesting refill, Selenium sulfide rx is expired, last filled 06/05/23. Discussed eating foods with probiotics such as yogurt, fermented foods. Educated on care advice as documented in protocol, patient verbalized understanding. Discussed reasons to call back.     Message from [place] S sent at 01/28/2024  3:56 PM EDT  Summary: rash.   Copied From CRM [id number]. Reason for Triage: Patient would like selenium sulfide (SELSUN) 2.5 % lotion called in for a rash on his chest, neck, and under arms and also the rest of his body.  Callback #: [phone]  Preferred Pharmacy: [facility] - [place], [place] - [address] [place] [address] Phone: [phone] Fax: [phone] Hours: Not open 24 hours      Reason for Disposition  Mild widespread rash  (Exception: Heat rash lasting 3 days or less.)  Protocols used: Rash or Redness - [facility]

## 2024-01-29 ENCOUNTER — Telehealth: Payer: Self-pay

## 2024-01-29 NOTE — Telephone Encounter (Addendum)
 Jeffery King (Key: [id number]) Rx #: [id number] Need Help? Call us at [phone] Status New (Not sent to plan) Drug Selenium Sulfide 2.5% lotion ePA cloud logo Form [facility] [place] Form [phone] NCPDP) Original Claim Info [phone] 3 DS O/R, USE [facility] [id number] [facility], CONTACT PRESCRIBER 101M   Approved today by [facility] PA Case: [id number], Status: Approved, Coverage Starts on: 01/29/2024 12:00:00 AM, Coverage Ends on: 01/28/2025 12:00:00 AM. Effective Date: 01/29/2024 Authorization Expiration Date: 01/28/2025

## 2024-02-01 ENCOUNTER — Other Ambulatory Visit: Payer: Self-pay | Admitting: Family Medicine

## 2024-02-01 DIAGNOSIS — M5412 Radiculopathy, cervical region: Secondary | ICD-10-CM

## 2024-02-02 MED ORDER — OXYCODONE-ACETAMINOPHEN 10-325 MG PO TABS: 1.0000 | ORAL_TABLET | ORAL | 0 refills | Status: DC | PRN

## 2024-02-11 ENCOUNTER — Other Ambulatory Visit: Payer: Self-pay | Admitting: Family Medicine

## 2024-02-11 ENCOUNTER — Other Ambulatory Visit

## 2024-02-11 DIAGNOSIS — I10 Essential (primary) hypertension: Secondary | ICD-10-CM

## 2024-02-11 DIAGNOSIS — Z125 Encounter for screening for malignant neoplasm of prostate: Secondary | ICD-10-CM | POA: Diagnosis not present

## 2024-02-11 DIAGNOSIS — M5412 Radiculopathy, cervical region: Secondary | ICD-10-CM

## 2024-02-11 DIAGNOSIS — E7800 Pure hypercholesterolemia, unspecified: Secondary | ICD-10-CM

## 2024-02-11 DIAGNOSIS — R7309 Other abnormal glucose: Secondary | ICD-10-CM

## 2024-02-11 DIAGNOSIS — Z0000 Encounter for general adult medical examination without abnormal findings: Secondary | ICD-10-CM | POA: Diagnosis not present

## 2024-02-11 DIAGNOSIS — E559 Vitamin D deficiency, unspecified: Secondary | ICD-10-CM

## 2024-02-11 MED ORDER — OXYCODONE-ACETAMINOPHEN 10-325 MG PO TABS: 1.0000 | ORAL_TABLET | ORAL | 0 refills | Status: DC | PRN

## 2024-02-12 LAB — COMPLETE METABOLIC PANEL WITHOUT GFR
AG Ratio: 1.8 (calc) (ref 1.0–2.5)
ALT: 39 U/L (ref 9–46)
AST: 31 U/L (ref 10–40)
Albumin: 4.2 g/dL (ref 3.6–5.1)
Alkaline phosphatase (APISO): 63 U/L (ref 36–130)
BUN: 10 mg/dL (ref 7–25)
CO2: 31 mmol/L (ref 20–32)
Calcium: 9.3 mg/dL (ref 8.6–10.3)
Chloride: 105 mmol/L (ref 98–110)
Creat: 0.88 mg/dL (ref 0.60–1.29)
Globulin: 2.4 g/dL (ref 1.9–3.7)
Glucose, Bld: 93 mg/dL (ref 65–99)
Potassium: 4.8 mmol/L (ref 3.5–5.3)
Sodium: 143 mmol/L (ref 135–146)
Total Bilirubin: 0.3 mg/dL (ref 0.2–1.2)
Total Protein: 6.6 g/dL (ref 6.1–8.1)

## 2024-02-12 LAB — LIPID PANEL
Cholesterol: 166 mg/dL (ref <200)
HDL: 76 mg/dL (ref >=40)
LDL Cholesterol (Calc): 68 mg/dL
Non-HDL Cholesterol (Calc): 90 mg/dL (ref <130)
Total CHOL/HDL Ratio: 2.2 (calc) (ref <5.0)
Triglycerides: 140 mg/dL (ref <150)

## 2024-02-12 LAB — CBC WITH DIFFERENTIAL/PLATELET
Absolute Lymphocytes: 1611 {cells}/uL (ref 850–3900)
Absolute Monocytes: 663 {cells}/uL (ref 200–950)
Basophils Absolute: 32 {cells}/uL (ref 0–200)
Basophils Relative: 0.6 %
Eosinophils Absolute: 11 {cells}/uL — ABNORMAL LOW (ref 15–500)
Eosinophils Relative: 0.2 %
HCT: 46.2 % (ref 38.5–50.0)
Hemoglobin: 15.3 g/dL (ref 13.2–17.1)
MCH: 30.6 pg (ref 27.0–33.0)
MCHC: 33.1 g/dL (ref 32.0–36.0)
MCV: 92.4 fL (ref 80.0–100.0)
MPV: 10.9 fL (ref 7.5–12.5)
Monocytes Relative: 12.5 %
Neutro Abs: 2984 {cells}/uL (ref 1500–7800)
Neutrophils Relative %: 56.3 %
Platelets: 190 Thousand/uL (ref 140–400)
RBC: 5 Million/uL (ref 4.20–5.80)
RDW: 12.5 % (ref 11.0–15.0)
Total Lymphocyte: 30.4 %
WBC: 5.3 Thousand/uL (ref 3.8–10.8)

## 2024-02-12 LAB — PSA: PSA: 0.7 ng/mL (ref <=4.00)

## 2024-02-12 LAB — VITAMIN D 25 HYDROXY (VIT D DEFICIENCY, FRACTURES): Vit D, 25-Hydroxy: 23 ng/mL — ABNORMAL LOW (ref 30–100)

## 2024-02-12 LAB — HEMOGLOBIN A1C
Hgb A1c MFr Bld: 5.4 % (ref <5.7)
Mean Plasma Glucose: 108 mg/dL
eAG (mmol/L): 6 mmol/L

## 2024-02-22 ENCOUNTER — Other Ambulatory Visit: Payer: Self-pay | Admitting: Family Medicine

## 2024-02-22 DIAGNOSIS — M5412 Radiculopathy, cervical region: Secondary | ICD-10-CM

## 2024-02-23 MED ORDER — OXYCODONE-ACETAMINOPHEN 10-325 MG PO TABS
1.0000 | ORAL_TABLET | ORAL | 0 refills | Status: DC | PRN
Start: 2024-02-23 — End: 2024-03-04

## 2024-02-24 ENCOUNTER — Encounter: Payer: Self-pay | Admitting: Family Medicine

## 2024-02-24 ENCOUNTER — Ambulatory Visit: Payer: Self-pay

## 2024-02-24 ENCOUNTER — Ambulatory Visit (INDEPENDENT_AMBULATORY_CARE_PROVIDER_SITE_OTHER): Admitting: Family Medicine

## 2024-02-24 VITALS — BP 134/86 | HR 70 | Ht 70.0 in | Wt 162.5 lb

## 2024-02-24 DIAGNOSIS — I10 Essential (primary) hypertension: Secondary | ICD-10-CM

## 2024-02-24 DIAGNOSIS — R0781 Pleurodynia: Secondary | ICD-10-CM | POA: Diagnosis not present

## 2024-02-24 NOTE — Telephone Encounter (Signed)
 Copied from CRM [id number]. Topic: Clinical - Red Word Triage >> Feb 24, 2024 12:06 PM Carlatta H wrote: [facility] that prompted transfer to Nurse Triage: Patient has been having shortness of breath and severe pain in chest//symptoms for about 4 day//    Chief Complaint: SOB, pain with deep breaths. History of asthma per pt.  Symptoms: Above Frequency: today Pertinent Negatives: Patient denies fever Disposition: [] ED /[] Urgent Care (no appt availability in office) / [x] Appointment(In office/virtual)/ []  [place] [facility]/ [] Home Care/ [] Refused Recommended Disposition /[] [place] [facility]/ []  Follow-up with PCP Additional Notes: Agrees with appointment. Will call 911 for worsening symptoms.  Reason for Disposition  [1] MILD difficulty breathing (e.g., minimal/no SOB at rest, SOB with walking, pulse <100) AND [2] NEW-onset or WORSE than normal  Answer Assessment - Initial Assessment Questions 1. RESPIRATORY STATUS: "Describe your breathing?" (e.g., wheezing, shortness of breath, unable to speak, severe coughing)      SOB 2. ONSET: "When did this breathing problem begin?"      4 days 3. PATTERN "Does the difficult breathing come and go, or has it been constant since it started?"      Comes and goes 4. SEVERITY: "How bad is your breathing?" (e.g., mild, moderate, severe)    - MILD: No SOB at rest, mild SOB with walking, speaks normally in sentences, can lie down, no retractions, pulse < 100.    - MODERATE: SOB at rest, SOB with minimal exertion and prefers to sit, cannot lie down flat, speaks in phrases, mild retractions, audible wheezing, pulse 100-120.    - SEVERE: Very SOB at rest, speaks in single words, struggling to breathe, sitting hunched forward, retractions, pulse > 120      Yes 5. RECURRENT SYMPTOM: "Have you had difficulty breathing before?" If Yes, ask: "When was the last time?" and "What happened that time?"      yes 6. CARDIAC HISTORY: "Do you have any history of heart  disease?" (e.g., heart attack, angina, bypass surgery, angioplasty)      no 7. LUNG HISTORY: "Do you have any history of lung disease?"  (e.g., pulmonary embolus, asthma, emphysema)     asthma 8. CAUSE: "What do you think is causing the breathing problem?"      unsure 9. OTHER SYMPTOMS: "Do you have any other symptoms? (e.g., dizziness, runny nose, cough, chest pain, fever)     no 10. O2 SATURATION MONITOR:  "Do you use an oxygen saturation monitor (pulse oximeter) at home?" If Yes, ask: "What is your reading (oxygen level) today?" "What is your usual oxygen saturation reading?" (e.g., 95%)       no 11. PREGNANCY: "Is there any chance you are pregnant?" "When was your last menstrual period?"       N/a 12. TRAVEL: "Have you traveled out of the country in the last month?" (e.g., travel history, exposures)       no  Protocols used: Breathing Difficulty-A-AH

## 2024-02-24 NOTE — Patient Instructions (Addendum)
 Thank you for coming to the office today.  Likely a pleuritic pain with the lining of the lung / muscle inflamed contributing to your symptoms.  Support the ribs and back and continue medication  You have been referred for a Coronary Calcium Score Cardiac CT Scan. This is a screening test for patients aged 49-50+ with cardiovascular risk factors or who are healthy but would be interested in Cardiovascular Screening for heart disease. Even if there is a family history of heart disease, this imaging can be useful. Typically it can be done every 5+ years or at a different timeline we agree on  The scan will look at the chest and mainly focus on the heart and identify early signs of calcium build up or blockages within the heart arteries. It is not 100% accurate for identifying blockages or heart disease, but it is useful to help us  predict who may have some early changes or be at risk in the future for a heart attack or cardiovascular problem.  The results are reviewed by a Cardiologist and they will document the results. It should become available on MyChart. Typically the results are divided into percentiles based on other patients of the same demographic and age. So it will compare your risk to others similar to you. If you have a higher score >99 or higher percentile >75%tile, it is recommended to consider Statin cholesterol therapy and or referral to Cardiologist. I will try to help explain your results and if we have questions we can contact the Cardiologist.  You will be contacted for scheduling. Usually it is done at any imaging facility through [facility], [facility] or [facility].  The cost is $99 flat fee total and it does not go through insurance, so no authorization is required.   Please schedule a Follow-up Appointment to: Return if symptoms worsen or fail to improve.  If you have any other questions or concerns, please feel free to call the office or  send a message through MyChart. You may also schedule an earlier appointment if necessary.  Additionally, you may be receiving a survey about your experience at our office within a few days to 1 week by e-mail or mail. We value your feedback.  Domingo Friend, DO [facility] [place], [place]

## 2024-02-24 NOTE — Progress Notes (Signed)
 Subjective:    Patient ID: Jeffery King, male    DOB: 09-27-1976, 48 y.o.   MRN: [id number]  Jeffery King is a 48 y.o. male presenting on 02/24/2024 for Pleuritic Pain  Patient presents for a same day appointment.  HPI  Discussed the use of AI scribe software for clinical note transcription with the patient, who gave verbal consent to proceed.  History of Present Illness   Jeffery King is a 48 year old male who presents with pleuritic chest pain.  He has been experiencing pleuritic chest pain for four days, which began after lifting a heavy object at work. Initially, there was muscle tightening in the right arm, but the pain has since shifted to the left mid to lower back, radiating to the left shoulder and arm. The pain is sharp and worsens with deep breathing. It was severe enough to cause tears and difficulty breathing, especially when lying down. Muscle relaxants and pain medication provide some relief, although the pain began to ease before the medication took effect. He continues to take muscle relaxants to manage the symptoms.  He experienced Right jaw pain on the right side about two weeks ago, which made it difficult to bite down. The pain has improved but persists slightly. He suspects a dental issue, although there is no swelling or significant pain in the mouth.  He reports pressure in his ears and neck, which he associates with sinus issues, as he experiences similar symptoms during allergy season. He has been using decongestants and allergy medications to manage these symptoms.  His sleep schedule is irregular, and the pain worsens after waking from naps. He is concerned about the possibility of other conditions such as kidney issues, but he has no urinary symptoms or fever.  He is currently taking blood pressure medication, although he has been taking it every other day. His blood pressure readings have been slightly elevated, which he attributes to pain and anxiety. No fever,  urinary symptoms, or significant swelling in the mouth.   He admits feeling anxious about his physical complaints and symptoms.          02/24/2024    2:48 PM 01/06/2024    3:43 PM 03/20/2023    9:40 AM  Depression screen PHQ 2/9  Decreased Interest 1 0 2  Down, Depressed, Hopeless 0 0 2  PHQ - 2 Score 1 0 4  Altered sleeping 1 0 2  Tired, decreased energy 1 1 2   Change in appetite 0 0 1  Feeling bad or failure about yourself  0 0 1  Trouble concentrating 1 0 1  Moving slowly or fidgety/restless 0 0 0  Suicidal thoughts 0 0 0  PHQ-9 Score 4 1 11   Difficult doing work/chores Somewhat difficult Somewhat difficult        02/24/2024    2:48 PM 01/06/2024    3:43 PM 03/20/2023    9:40 AM 01/02/2023   11:13 AM  GAD 7 : Generalized Anxiety Score  Nervous, Anxious, on Edge 0 0 1 2  Control/stop worrying 1 0 1 2  Worry too much - different things 1 0 1 2  Trouble relaxing 1 0 3 2  Restless 1 0 1 2  Easily annoyed or irritable 1 0 2 2  Afraid - awful might happen 1 0 2 2  Total GAD 7 Score 6 0 11 14  Anxiety Difficulty Somewhat difficult   Somewhat difficult    Social History   Tobacco  Use   Smoking status: Former    Current packs/day: 0.00    Types: Cigarettes    Quit date: 05/30/2014    Years since quitting: 9.7   Smokeless tobacco: Former  Building services engineer status: Never Used  Substance Use Topics   Alcohol use: No   Drug use: No    Review of Systems Per HPI unless specifically indicated above     Objective:    BP 134/86 (BP Location: Left Arm, Cuff Size: Normal)   Pulse 70   Ht 5\' 10"  (1.778 m)   Wt 162 lb 8 oz (73.7 kg)   SpO2 98%   BMI 23.32 kg/m   Wt Readings from Last 3 Encounters:  02/24/24 162 lb 8 oz (73.7 kg)  01/06/24 157 lb (71.2 kg)  07/06/23 175 lb (79.4 kg)    Physical Exam Vitals and nursing note reviewed.  Constitutional:      General: He is not in acute distress.    Appearance: He is well-developed. He is not diaphoretic.      Comments: Well-appearing, comfortable, cooperative  HENT:     Head: Normocephalic and atraumatic.  Eyes:     General:        Right eye: No discharge.        Left eye: No discharge.     Conjunctiva/sclera: Conjunctivae normal.  Neck:     Thyroid: No thyromegaly.  Cardiovascular:     Rate and Rhythm: Normal rate and regular rhythm.     Pulses: Normal pulses.     Heart sounds: Normal heart sounds. No murmur heard. Pulmonary:     Effort: Pulmonary effort is normal. No respiratory distress.     Breath sounds: Normal breath sounds. No wheezing or rales.     Comments: He is able to take full deep breath but reports some discomfort in left flank and ribcage with deep breathing but does not interfere with his breath. Musculoskeletal:        General: Normal range of motion.     Cervical back: Normal range of motion and neck supple.  Lymphadenopathy:     Cervical: No cervical adenopathy.  Skin:    General: Skin is warm and dry.     Findings: No erythema or rash.  Neurological:     Mental Status: He is alert and oriented to person, place, and time. Mental status is at baseline.  Psychiatric:        Behavior: Behavior normal.     Comments: Well groomed, good eye contact, normal speech and thoughts  Very Anxious appearing     Results for orders placed or performed in visit on 02/11/24  VITAMIN D  25 Hydroxy (Vit-D Deficiency, Fractures)   Collection Time: 02/11/24  8:12 AM  Result Value Ref Range   Vit D, 25-Hydroxy 23 (L) 30 - 100 ng/mL  PSA   Collection Time: 02/11/24  8:12 AM  Result Value Ref Range   PSA 0.70 < OR = 4.00 ng/mL  COMPLETE METABOLIC PANEL WITH GFR   Collection Time: 02/11/24  8:12 AM  Result Value Ref Range   Glucose, Bld 93 65 - 99 mg/dL   BUN 10 7 - 25 mg/dL   Creat 1.61 0.96 - 0.45 mg/dL   BUN/Creatinine Ratio SEE NOTE: 6 - 22 (calc)   Sodium 143 135 - 146 mmol/L   Potassium 4.8 3.5 - 5.3 mmol/L   Chloride 105 98 - 110 mmol/L   CO2 31 20 - 32 mmol/L  Calcium 9.3 8.6 - 10.3 mg/dL   Total Protein 6.6 6.1 - 8.1 g/dL   Albumin 4.2 3.6 - 5.1 g/dL   Globulin 2.4 1.9 - 3.7 g/dL (calc)   AG Ratio 1.8 1.0 - 2.5 (calc)   Total Bilirubin 0.3 0.2 - 1.2 mg/dL   Alkaline phosphatase (APISO) 63 36 - 130 U/L   AST 31 10 - 40 U/L   ALT 39 9 - 46 U/L  CBC with Differential/Platelet   Collection Time: 02/11/24  8:12 AM  Result Value Ref Range   WBC 5.3 3.8 - 10.8 Thousand/uL   RBC 5.00 4.20 - 5.80 Million/uL   Hemoglobin 15.3 13.2 - 17.1 g/dL   HCT 08.6 57.8 - 46.9 %   MCV 92.4 80.0 - 100.0 fL   MCH 30.6 27.0 - 33.0 pg   MCHC 33.1 32.0 - 36.0 g/dL   RDW 62.9 52.8 - 41.3 %   Platelets 190 140 - 400 Thousand/uL   MPV 10.9 7.5 - 12.5 fL   Neutro Abs 2,984 1,500 - 7,800 cells/uL   Absolute Lymphocytes 1,611 850 - 3,900 cells/uL   Absolute Monocytes 663 200 - 950 cells/uL   Eosinophils Absolute 11 (L) 15 - 500 cells/uL   Basophils Absolute 32 0 - 200 cells/uL   Neutrophils Relative % 56.3 %   Total Lymphocyte 30.4 %   Monocytes Relative 12.5 %   Eosinophils Relative 0.2 %   Basophils Relative 0.6 %  Hemoglobin A1c   Collection Time: 02/11/24  8:12 AM  Result Value Ref Range   Hgb A1c MFr Bld 5.4 <5.7 %   Mean Plasma Glucose 108 mg/dL   eAG (mmol/L) 6.0 mmol/L  Lipid panel   Collection Time: 02/11/24  8:12 AM  Result Value Ref Range   Cholesterol 166 <200 mg/dL   HDL 76 > OR = 40 mg/dL   Triglycerides 244 <010 mg/dL   LDL Cholesterol (Calc) 68 mg/dL (calc)   Total CHOL/HDL Ratio 2.2 <5.0 (calc)   Non-HDL Cholesterol (Calc) 90 <272 mg/dL (calc)      Assessment & Plan:   Problem List Items Addressed This Visit     Essential hypertension   Relevant Orders   CT CARDIAC SCORING (SELF PAY ONLY)   Other Visit Diagnoses       Pleuritic chest pain    -  Primary        Pleuritic pain Acute Left flank and back pleuritic pain likely due to pleural inflammation vs costochondritis, possibly from muscle strain or nerve irritation.   Evaluation today to discuss other possible etiology, however symptoms significantly improved after initial onset in past 1-2 days, he does still experience the same pleuritic pain but it does subside with muscle relaxant and pain medicine and applied pressure.  He is not endorsing any symptoms of UTI or Kidney Stone. No dyspnea or chest pressure. Not having sweating nausea vomiting or other concerning features. HR is normal PERC Score 0 - unlikely Pulmonary Embolus, no further work up required.  - Continue muscle relaxants methocarbamol  and analgesics oxycodone  as prescribed. - Consider temporary rib support during activities. - Avoid heavy lifting and straining.  Note anxiety component is likely a factor for current symptoms and his concern  Also having some R jaw pain that was related to chewing only. It has since resolved, this was a recent problem. He has some dental concerns but no sign of abscess or dental infection  Question if some underlying sinus pressure / affecting him. He  has chronic GERD history as well, has some symptoms of belching and gas that improve his flank and rib pain as well.  Elevated blood pressure Blood pressure at 142/90, improved to 134/86. Inconsistent antihypertensive use causing fluctuations. - Advise daily antihypertensive medication use. - Monitor blood pressure regularly.  General Health Maintenance Routine screening. Discussed heart scan for coronary artery assessment in individuals aged 77-75. CT scan detects calcium buildup or blockages, reviewed by a cardiologist. - Order heart CT scan.        Orders Placed This Encounter  Procedures   CT CARDIAC SCORING (SELF PAY ONLY)    Standing Status:   Future    Expiration Date:   02/23/2025    Preferred imaging location?:   [facility]    No orders of the defined types were placed in this encounter.   Follow up plan: Return if symptoms worsen or fail to improve.   Domingo Friend,  DO [facility] 02/24/2024, 2:19 PM

## 2024-02-25 ENCOUNTER — Encounter: Admitting: Family Medicine

## 2024-03-04 ENCOUNTER — Other Ambulatory Visit: Payer: Self-pay | Admitting: Family Medicine

## 2024-03-04 DIAGNOSIS — M5412 Radiculopathy, cervical region: Secondary | ICD-10-CM

## 2024-03-04 MED ORDER — OXYCODONE-ACETAMINOPHEN 10-325 MG PO TABS: 1.0000 | ORAL_TABLET | ORAL | 0 refills | Status: DC | PRN

## 2024-03-15 ENCOUNTER — Other Ambulatory Visit: Payer: Self-pay | Admitting: Family Medicine

## 2024-03-15 DIAGNOSIS — M5412 Radiculopathy, cervical region: Secondary | ICD-10-CM

## 2024-03-17 ENCOUNTER — Other Ambulatory Visit: Payer: Self-pay | Admitting: Family Medicine

## 2024-03-17 DIAGNOSIS — M5412 Radiculopathy, cervical region: Secondary | ICD-10-CM

## 2024-03-17 DIAGNOSIS — I10 Essential (primary) hypertension: Secondary | ICD-10-CM

## 2024-03-17 NOTE — Telephone Encounter (Unsigned)
 Copied from CRM [id number]. Topic: Clinical - Medication Refill >> Mar 17, 2024  4:23 PM Everette C wrote: Medication: oxyCODONE -acetaminophen  (PERCOCET) 10-325 MG tablet [[id number]]  amLODipine  (NORVASC ) 5 MG tablet [[id number]]  Has the patient contacted their pharmacy? Yes (Agent: If no, request that the patient contact the pharmacy for the refill. If patient does not wish to contact the pharmacy document the reason why and proceed with request.) (Agent: If yes, when and what did the pharmacy advise?)  This is the patient's preferred pharmacy:  [facility] - [place], [place] - [address] [place] [address] Phone: [phone] Fax: [phone]  Is this the correct pharmacy for this prescription? Yes If no, delete pharmacy and type the correct one.   Has the prescription been filled recently? No  Is the patient out of the medication? Yes  Has the patient been seen for an appointment in the last year OR does the patient have an upcoming appointment? Yes  Can we respond through MyChart? No  Agent: Please be advised that Rx refills may take up to 3 business days. We ask that you follow-up with your pharmacy.

## 2024-03-18 ENCOUNTER — Other Ambulatory Visit: Payer: Self-pay | Admitting: Family Medicine

## 2024-03-18 DIAGNOSIS — I10 Essential (primary) hypertension: Secondary | ICD-10-CM

## 2024-03-18 DIAGNOSIS — J4530 Mild persistent asthma, uncomplicated: Secondary | ICD-10-CM

## 2024-03-18 NOTE — Telephone Encounter (Signed)
 Requested medication (s) are due for refill today: yes  Requested medication (s) are on the active medication list: yes  Last refill:  03/04/24  Future visit scheduled: no  Notes to clinic:  Unable to refill per protocol, cannot delegate. Routing for approval due for refill.      Requested Prescriptions  Pending Prescriptions Disp Refills   oxyCODONE -acetaminophen  (PERCOCET) 10-325 MG tablet [Pharmacy Med Name: OXYCODONE -APAP 10-325 MG TAB] 45 tablet     Sig: TAKE 1 TABLET BY MOUTH EVERY 4 HOURS AS NEEDED FOR PAIN     Not Delegated - Analgesics:  Opioid Agonist Combinations Failed - 03/18/2024 11:33 AM      Failed - This refill cannot be delegated      Failed - Urine Drug Screen completed in last 360 days      Failed - Valid encounter within last 3 months    Recent Outpatient Visits           3 weeks ago Pleuritic chest pain   [place] [facility] Raina Bunting, DO   2 months ago Essential hypertension   [facility] [place], [place], [place]

## 2024-03-19 MED ORDER — AMLODIPINE BESYLATE 5 MG PO TABS: 5.0000 mg | ORAL_TABLET | Freq: Every day | ORAL | 1 refills | Status: DC

## 2024-03-19 NOTE — Telephone Encounter (Signed)
 Requested medication (s) are due for refill today: yes   Requested medication (s) are on the active medication list: yes   Last refill:  norvasc - 01/06/24 #30 0 refills , percocet- 03/18/24 #45 0 refills  Future visit scheduled: no   Notes to clinic:  no refills remain for norvasc  last OV 01/06/24. Do you want to refill Rx? Percocet not delegated per protocol. Last refill shows 03/18/24 #45 0 refills     Requested Prescriptions  Pending Prescriptions Disp Refills   amLODipine  (NORVASC ) 5 MG tablet 30 tablet 0    Sig: Take 1 tablet (5 mg total) by mouth daily.     Cardiovascular: Calcium Channel Blockers 2 Failed - 03/19/2024  3:29 PM      Failed - Valid encounter within last 6 months    Recent Outpatient Visits           3 weeks ago Pleuritic chest pain   [facility] [employer], Kayleen Party, DO   2 months ago Essential hypertension   [place] [facility] Raina Bunting, DO              Passed - Last BP in normal range    BP Readings from Last 1 Encounters:  02/24/24 134/86         Passed - Last Heart Rate in normal range    Pulse Readings from Last 1 Encounters:  02/24/24 70          oxyCODONE -acetaminophen  (PERCOCET) 10-325 MG tablet 45 tablet 0    Sig: Take 1 tablet by mouth every 4 (four) hours as needed. for pain     Not Delegated - Analgesics:  Opioid Agonist Combinations Failed - 03/19/2024  3:29 PM      Failed - This refill cannot be delegated      Failed - Urine Drug Screen completed in last 360 days      Failed - Valid encounter within last 3 months    Recent Outpatient Visits           3 weeks ago Pleuritic chest pain   [place] [facility] Raina Bunting, DO   2 months ago Essential hypertension   [place] [facility] [place], [place], [place]

## 2024-03-20 NOTE — Telephone Encounter (Signed)
 amLODipine  (NORVASC ) 5 MG tablet 90 tablet 1 03/19/2024 --   Sig - Route: Take 1 tablet (5 mg total) by mouth daily. - Oral   Sent to pharmacy as: amLODipine  (NORVASC ) 5 MG tablet   E-Prescribing Status: Receipt confirmed by pharmacy (03/19/2024  3:43 PM EDT)    Requested Prescriptions  Pending Prescriptions Disp Refills   albuterol  (VENTOLIN  HFA) 108 (90 Base) MCG/ACT inhaler [Pharmacy Med Name: VENTOLIN  HFA 108 (90 BASE) MCG/ACT] 18 g 1    Sig: INHALE 1-2 PUFFS INTO THE LUNGS EVERY 4 HOURS AS NEEDED FOR WHEEZING OR SHORTNESS OF BREATH     Pulmonology:  Beta Agonists 2 Failed - 03/20/2024  2:21 PM      Failed - Valid encounter within last 12 months    Recent Outpatient Visits           3 weeks ago Pleuritic chest pain   [place] [facility] Raina Bunting, DO   2 months ago Essential hypertension   [facility] Domingo Friend J, DO              Passed - Last BP in normal range    BP Readings from Last 1 Encounters:  02/24/24 134/86         Passed - Last Heart Rate in normal range    Pulse Readings from Last 1 Encounters:  02/24/24 70         Refused Prescriptions Disp Refills   amLODipine  (NORVASC ) 5 MG tablet [Pharmacy Med Name: AMLODIPINE  BESYLATE 5 MG TAB] 30 tablet 0    Sig: TAKE 1 TABLET BY MOUTH ONCE DAILY     Cardiovascular: Calcium Channel Blockers 2 Failed - 03/20/2024  2:21 PM      Failed - Valid encounter within last 6 months    Recent Outpatient Visits           3 weeks ago Pleuritic chest pain   [facility] Raina Bunting, DO   2 months ago Essential hypertension   [place] [facility] Domingo Friend J, DO              Passed - Last BP in normal range    BP Readings from Last 1 Encounters:  02/24/24 134/86         Passed - Last Heart Rate in normal range    Pulse Readings from Last 1 Encounters:  02/24/24 70

## 2024-03-26 ENCOUNTER — Other Ambulatory Visit: Payer: Self-pay | Admitting: Family Medicine

## 2024-03-26 DIAGNOSIS — M5412 Radiculopathy, cervical region: Secondary | ICD-10-CM

## 2024-03-29 NOTE — Telephone Encounter (Signed)
 Requested medications are due for refill today.  no  Requested medications are on the active medications list.  yes  Last refill. 03/18/2024 #45 0 rf  Future visit scheduled.   no  Notes to clinic.  Refill/refusal not delegated.    Requested Prescriptions  Pending Prescriptions Disp Refills   oxyCODONE -acetaminophen  (PERCOCET) 10-325 MG tablet [Pharmacy Med Name: OXYCODONE -APAP 10-325 MG TAB] 45 tablet     Sig: TAKE 1 TABLET BY MOUTH EVERY 4 HOURS AS NEEDED FOR PAIN     Not Delegated - Analgesics:  Opioid Agonist Combinations Failed - 03/29/2024  1:24 PM      Failed - This refill cannot be delegated      Failed - Urine Drug Screen completed in last 360 days      Failed - Valid encounter within last 3 months    Recent Outpatient Visits           1 month ago Pleuritic chest pain   [place] Raina Bunting, DO   2 months ago Essential hypertension   [place] [facility] [place], Kayleen Party, [place]

## 2024-04-07 ENCOUNTER — Other Ambulatory Visit: Payer: Self-pay | Admitting: Family Medicine

## 2024-04-07 DIAGNOSIS — M5412 Radiculopathy, cervical region: Secondary | ICD-10-CM

## 2024-04-09 NOTE — Telephone Encounter (Signed)
 Requested medications are due for refill today.  yes  Requested medications are on the active medications list.  yes  Last refill. 03/29/2024 #45 0 rf  Future visit scheduled.   no  Notes to clinic.  Refill not delegated.    Requested Prescriptions  Pending Prescriptions Disp Refills   oxyCODONE -acetaminophen  (PERCOCET) 10-325 MG tablet [Pharmacy Med Name: OXYCODONE -APAP 10-325 MG TAB] 45 tablet     Sig: TAKE 1 TABLET BY MOUTH EVERY 4 HOURS AS NEEDED FOR PAIN     Not Delegated - Analgesics:  Opioid Agonist Combinations Failed - 04/09/2024  2:45 PM      Failed - This refill cannot be delegated      Failed - Urine Drug Screen completed in last 360 days      Failed - Valid encounter within last 3 months    Recent Outpatient Visits           1 month ago Pleuritic chest pain   [facility] Raina Bunting, DO   3 months ago Essential hypertension    [facility] [place], Kayleen Party, [place]

## 2024-04-20 ENCOUNTER — Other Ambulatory Visit: Payer: Self-pay | Admitting: Family Medicine

## 2024-04-20 DIAGNOSIS — M5412 Radiculopathy, cervical region: Secondary | ICD-10-CM

## 2024-04-22 NOTE — Telephone Encounter (Signed)
 Requested medication (s) are due for refill today - yes  Requested medication (s) are on the active medication list -yes  Future visit scheduled -no  Last refill: 04/09/24 #45  Notes to clinic: non delegated Rx  Requested Prescriptions  Pending Prescriptions Disp Refills   oxyCODONE -acetaminophen  (PERCOCET) 10-325 MG tablet [Pharmacy Med Name: OXYCODONE -APAP 10-325 MG TAB] 45 tablet     Sig: TAKE 1 TABLET BY MOUTH EVERY 4 HOURS AS NEEDED FOR PAIN     Not Delegated - Analgesics:  Opioid Agonist Combinations Failed - 04/22/2024  1:07 PM      Failed - This refill cannot be delegated      Failed - Urine Drug Screen completed in last 360 days      Failed - Valid encounter within last 3 months    Recent Outpatient Visits           1 month ago Pleuritic chest pain   [place] [facility] [facility]a PARAS, DO   3 months ago Essential hypertension   [place] [facility] [place]a PARAS, DO                 Requested Prescriptions  Pending Prescriptions Disp Refills   oxyCODONE -acetaminophen  (PERCOCET) 10-325 MG tablet [Pharmacy Med Name: OXYCODONE -APAP 10-325 MG TAB] 45 tablet     Sig: TAKE 1 TABLET BY MOUTH EVERY 4 HOURS AS NEEDED FOR PAIN     Not Delegated - Analgesics:  Opioid Agonist Combinations Failed - 04/22/2024  1:07 PM      Failed - This refill cannot be delegated      Failed - Urine Drug Screen completed in last 360 days      Failed - Valid encounter within last 3 months    Recent Outpatient Visits           1 month ago Pleuritic chest pain   [place] [facility] Marsa PARAS, DO   3 months ago Essential hypertension   [place] [place], Marsa PARAS, [place]

## 2024-04-29 ENCOUNTER — Other Ambulatory Visit: Payer: Self-pay | Admitting: Family Medicine

## 2024-04-29 DIAGNOSIS — M5412 Radiculopathy, cervical region: Secondary | ICD-10-CM

## 2024-05-01 NOTE — Telephone Encounter (Signed)
 Requested medication (s) are due for refill today: no  Requested medication (s) are on the active medication list: yes  Last refill:  04/22/24 #45  Future visit scheduled: no  Notes to clinic:  ned not delegated to NT to RF   Requested Prescriptions  Pending Prescriptions Disp Refills   oxyCODONE -acetaminophen  (PERCOCET) 10-325 MG tablet [Pharmacy Med Name: OXYCODONE -APAP 10-325 MG TAB] 45 tablet     Sig: TAKE 1 TABLET BY MOUTH EVERY 4 HOURS AS NEEDED FOR PAIN     Not Delegated - Analgesics:  Opioid Agonist Combinations Failed - 05/01/2024 11:25 AM      Failed - This refill cannot be delegated      Failed - Urine Drug Screen completed in last 360 days      Failed - Valid encounter within last 3 months    Recent Outpatient Visits           2 months ago Pleuritic chest pain   [place] [facility] Ed[place] Marsa PARAS, DO   3 months ago Essential hypertension   [place] [facility] [place], Marsa PARAS, [place]

## 2024-05-03 NOTE — Telephone Encounter (Signed)
 The patient called in checking on the status of his refill request. I reminded him it takes 48-72 business hours to respond to and so it maybe still tomorrow until he hears back. He said he understands. Please assist patient further

## 2024-05-14 ENCOUNTER — Other Ambulatory Visit: Payer: Self-pay | Admitting: Family Medicine

## 2024-05-14 DIAGNOSIS — M5412 Radiculopathy, cervical region: Secondary | ICD-10-CM

## 2024-05-17 NOTE — Telephone Encounter (Signed)
 Requested medications are due for refill today.  yes  Requested medications are on the active medications list.  yes  Last refill. 05/03/2024 #45 0 rf  Future visit scheduled.   no  Notes to clinic.  Refill not delegated.    Requested Prescriptions  Pending Prescriptions Disp Refills   oxyCODONE -acetaminophen  (PERCOCET) 10-325 MG tablet [Pharmacy Med Name: OXYCODONE -APAP 10-325 MG TAB] 45 tablet     Sig: TAKE 1 TABLET BY MOUTH EVERY 4 HOURS AS NEEDED FOR PAIN     Not Delegated - Analgesics:  Opioid Agonist Combinations Failed - 05/17/2024 11:54 AM      Failed - This refill cannot be delegated      Failed - Urine Drug Screen completed in last 360 days      Failed - Valid encounter within last 3 months    Recent Outpatient Visits           2 months ago Pleuritic chest pain   [place] [facility] [place] Marsa PARAS, DO   4 months ago Essential hypertension   [place] [facility] [place], Marsa PARAS, [place]

## 2024-05-28 ENCOUNTER — Other Ambulatory Visit: Payer: Self-pay | Admitting: Family Medicine

## 2024-05-28 DIAGNOSIS — M5412 Radiculopathy, cervical region: Secondary | ICD-10-CM

## 2024-06-01 ENCOUNTER — Other Ambulatory Visit: Payer: Self-pay | Admitting: Family Medicine

## 2024-06-01 DIAGNOSIS — M5412 Radiculopathy, cervical region: Secondary | ICD-10-CM

## 2024-06-01 NOTE — Telephone Encounter (Signed)
 Copied from CRM [id number]. Topic: Clinical - Medication Refill >> Jun 01, 2024  1:12 PM Ivette P wrote: Medication: oxyCODONE -acetaminophen  (PERCOCET) 10-325 MG tablet  Has the patient contacted their pharmacy? Yes (Agent: If no, request that the patient contact the pharmacy for the refill. If patient does not wish to contact the pharmacy document the reason why and proceed with request.) (Agent: If yes, when and what did the pharmacy advise?)  This is the patient's preferred pharmacy:  [facility] - [place], [place] - [address] [place] [address] Phone: [phone] Fax: [phone]  Is this the correct pharmacy for this prescription? Yes If no, delete pharmacy and type the correct one.   Has the prescription been filled recently? Yes, 05/18/2024  Is the patient out of the medication? Yes  Has the patient been seen for an appointment in the last year OR does the patient have an upcoming appointment? Yes, 02/24/2024  Can we respond through MyChart? Yes  Agent: Please be advised that Rx refills may take up to 3 business days. We ask that you follow-up with your pharmacy.

## 2024-06-01 NOTE — Telephone Encounter (Signed)
 Requested medication (s) are due for refill today: yes  Requested medication (s) are on the active medication list: yes  Last refill:  05/18/24  Future visit scheduled: no  Notes to clinic:  Unable to refill per protocol, cannot delegate.      Requested Prescriptions  Pending Prescriptions Disp Refills   oxyCODONE -acetaminophen  (PERCOCET) 10-325 MG tablet [Pharmacy Med Name: OXYCODONE -APAP 10-325 MG TAB] 45 tablet     Sig: TAKE 1 TABLET BY MOUTH EVERY 4 HOURS AS NEEDED FOR PAIN     Not Delegated - Analgesics:  Opioid Agonist Combinations Failed - 06/01/2024  2:48 PM      Failed - This refill cannot be delegated      Failed - Urine Drug Screen completed in last 360 days      Failed - Valid encounter within last 3 months    Recent Outpatient Visits           3 months ago Pleuritic chest pain   [place] [facility] Marsa PARAS, DO   4 months ago Essential hypertension   [place] [facility] [place], Marsa PARAS, [place]

## 2024-06-04 NOTE — Telephone Encounter (Signed)
 Requested medication (s) are due for refill today: no  Requested medication (s) are on the active medication list: yes  Last refill:  06/01/24  Future visit scheduled: yes  Notes to clinic:  unable to refuse, routing for refusal.Duplicate request, [facility] 06/01/24 .E-Prescribing Status: Receipt confirmed by pharmacy (06/01/2024  5:19 PM EDT)      Requested Prescriptions  Pending Prescriptions Disp Refills   oxyCODONE -acetaminophen  (PERCOCET) 10-325 MG tablet 45 tablet 0    Sig: Take 1 tablet by mouth every 4 (four) hours as needed. for pain     Not Delegated - Analgesics:  Opioid Agonist Combinations Failed - 06/04/2024  9:28 AM      Failed - This refill cannot be delegated      Failed - Urine Drug Screen completed in last 360 days      Failed - Valid encounter within last 3 months    Recent Outpatient Visits           3 months ago Pleuritic chest pain   [place] [facility] [place], Marsa PARAS, DO   5 months ago Essential hypertension    [facility] [place], Marsa PARAS, [place]

## 2024-06-10 ENCOUNTER — Other Ambulatory Visit: Payer: Self-pay | Admitting: Family Medicine

## 2024-06-10 DIAGNOSIS — M5412 Radiculopathy, cervical region: Secondary | ICD-10-CM

## 2024-06-11 ENCOUNTER — Other Ambulatory Visit: Payer: Self-pay | Admitting: Family Medicine

## 2024-06-11 DIAGNOSIS — M5412 Radiculopathy, cervical region: Secondary | ICD-10-CM

## 2024-06-11 NOTE — Telephone Encounter (Signed)
 Copied from CRM [id number]. Topic: Clinical - Medication Refill >> Jun 11, 2024  3:10 PM Zebedee SAUNDERS wrote: Medication: oxyCODONE -acetaminophen  (PERCOCET) 10-325 MG tablet  Has the patient contacted their pharmacy? Yes (Agent: If no, request that the patient contact the pharmacy for the refill. If patient does not wish to contact the pharmacy document the reason why and proceed with request.) (Agent: If yes, when and what did the pharmacy advise?)  This is the patient's preferred pharmacy:  [facility] - [place] - [address] [place] [address] Phone: [phone] Fax: [phone]  Is this the correct pharmacy for this prescription? Yes If no, delete pharmacy and type the correct one.   Has the prescription been filled recently? Yes  Is the patient out of the medication? Yes  Has the patient been seen for an appointment in the last year OR does the patient have an upcoming appointment? Yes  Can we respond through MyChart? Yes  Agent: Please be advised that Rx refills may take up to 3 business days. We ask that you follow-up with your pharmacy.

## 2024-06-11 NOTE — Telephone Encounter (Signed)
 Requested medication (s) are due for refill today: no  Requested medication (s) are on the active medication list: yes  Last refill:  06/01/24  Future visit scheduled: no  Notes to clinic:  unable to refuse, routing for refusal due to too soon for refill.     Requested Prescriptions  Pending Prescriptions Disp Refills   oxyCODONE -acetaminophen  (PERCOCET) 10-325 MG tablet [Pharmacy Med Name: OXYCODONE -APAP 10-325 MG TAB] 45 tablet     Sig: TAKE 1 TABLET BY MOUTH EVERY 4 HOURS AS NEEDED FOR PAIN     Not Delegated - Analgesics:  Opioid Agonist Combinations Failed - 06/11/2024 12:37 PM      Failed - This refill cannot be delegated      Failed - Urine Drug Screen completed in last 360 days      Failed - Valid encounter within last 3 months    Recent Outpatient Visits           3 months ago Pleuritic chest pain   [place] [facility] Marsa PARAS, Jeffery [place]   5 months ago Essential hypertension   [place] [facility] [place], Marsa [place], [place]

## 2024-06-14 NOTE — Telephone Encounter (Signed)
 Requested medications are due for refill today.  no  Requested medications are on the active medications list.  yes  Last refill. 06/11/2024 #45 0 rf  Future visit scheduled.   no  Notes to clinic.  Refill/refusal not delegated.    Requested Prescriptions  Pending Prescriptions Disp Refills   oxyCODONE -acetaminophen  (PERCOCET) 10-325 MG tablet 45 tablet 0    Sig: Take 1 tablet by mouth every 4 (four) hours as needed. for pain     Not Delegated - Analgesics:  Opioid Agonist Combinations Failed - 06/14/2024 11:50 AM      Failed - This refill cannot be delegated      Failed - Urine Drug Screen completed in last 360 days      Failed - Valid encounter within last 3 months    Recent Outpatient Visits           3 months ago Pleuritic chest pain   [place] [facility] [place], Marsa PARAS, DO   5 months ago Essential hypertension   [place] [facility] [place], Marsa PARAS, [place]

## 2024-06-18 ENCOUNTER — Other Ambulatory Visit: Payer: Self-pay | Admitting: Family Medicine

## 2024-06-18 DIAGNOSIS — M5412 Radiculopathy, cervical region: Secondary | ICD-10-CM

## 2024-06-18 MED ORDER — OXYCODONE-ACETAMINOPHEN 10-325 MG PO TABS: 1.0000 | ORAL_TABLET | ORAL | 0 refills | Status: DC | PRN

## 2024-06-29 ENCOUNTER — Other Ambulatory Visit: Payer: Self-pay | Admitting: Family Medicine

## 2024-06-29 DIAGNOSIS — M5412 Radiculopathy, cervical region: Secondary | ICD-10-CM

## 2024-07-01 NOTE — Telephone Encounter (Signed)
 Requested medication (s) are due for refill today: yes  Requested medication (s) are on the active medication list: yes  Last refill:  06/18/24  Future visit scheduled: no  Notes to clinic:  Unable to refill per protocol, cannot delegate.      Requested Prescriptions  Pending Prescriptions Disp Refills   oxyCODONE -acetaminophen  (PERCOCET) 10-325 MG tablet [Pharmacy Med Name: OXYCODONE -APAP 10-325 MG TAB] 45 tablet     Sig: TAKE 1 TABLET BY MOUTH EVERY 4 HOURS AS NEEDED FOR PAIN     Not Delegated - Analgesics:  Opioid Agonist Combinations Failed - 07/01/2024  9:26 AM      Failed - This refill cannot be delegated      Failed - Urine Drug Screen completed in last 360 days      Failed - Valid encounter within last 3 months    Recent Outpatient Visits           4 months ago Pleuritic chest pain   [place] [facility] Marsa PARAS, DO   5 months ago Essential hypertension   [place] [facility] [place], Marsa PARAS, [place]

## 2024-07-01 NOTE — Telephone Encounter (Signed)
 Requested medication (s) are due for refill today: yes  Requested medication (s) are on the active medication list: yes  Last refill:  06/18/24  Future visit scheduled: no  Notes to clinic:  Unable to refill per protocol, cannot delegate.      Requested Prescriptions  Pending Prescriptions Disp Refills   oxyCODONE -acetaminophen  (PERCOCET) 10-325 MG tablet [Pharmacy Med Name: OXYCODONE -APAP 10-325 MG TAB] 45 tablet     Sig: TAKE 1 TABLET BY MOUTH EVERY 4 HOURS AS NEEDED FOR PAIN     Not Delegated - Analgesics:  Opioid Agonist Combinations Failed - 07/01/2024  9:25 AM      Failed - This refill cannot be delegated      Failed - Urine Drug Screen completed in last 360 days      Failed - Valid encounter within last 3 months    Recent Outpatient Visits           4 months ago Pleuritic chest pain   [place] [facility] Marsa PARAS, DO   5 months ago Essential hypertension    [facility] [place], [place]a PARAS, [place]

## 2024-07-01 NOTE — Telephone Encounter (Signed)
 Pt called to report that he takes his last pill today, please advise

## 2024-07-08 ENCOUNTER — Other Ambulatory Visit: Payer: Self-pay | Admitting: Family Medicine

## 2024-07-08 DIAGNOSIS — J4530 Mild persistent asthma, uncomplicated: Secondary | ICD-10-CM

## 2024-07-08 DIAGNOSIS — M5412 Radiculopathy, cervical region: Secondary | ICD-10-CM

## 2024-07-09 NOTE — Telephone Encounter (Signed)
 Requested Prescriptions  Pending Prescriptions Disp Refills   oxyCODONE -acetaminophen  (PERCOCET) 10-325 MG tablet [Pharmacy Med Name: OXYCODONE -APAP 10-325 MG TAB] 45 tablet     Sig: TAKE 1 TABLET BY MOUTH EVERY 4 HOURS AS NEEDED FOR PAIN     Not Delegated - Analgesics:  Opioid Agonist Combinations Failed - 07/09/2024 11:21 AM      Failed - This refill cannot be delegated      Failed - Urine Drug Screen completed in last 360 days      Failed - Valid encounter within last 3 months    Recent Outpatient Visits           4 months ago Pleuritic chest pain   [place] [facility] Marsa PARAS, DO   6 months ago Essential hypertension   [place] [facility] Marsa PARAS, DO               VENTOLIN  HFA 108 [phone] Base) MCG/ACT inhaler [Pharmacy Med Name: VENTOLIN  HFA 108 (90 BASE) MCG/ACT] 18 g 1    Sig: INHALE 1-2 PUFFS INTO THE LUNGS EVERY 4 HOURS AS NEEDED FOR WHEEZING OR SHORTNESS OF BREATH     Pulmonology:  Beta Agonists 2 Passed - 07/09/2024 11:21 AM      Passed - Last BP in normal range    BP Readings from Last 1 Encounters:  02/24/24 134/86         Passed - Last Heart Rate in normal range    Pulse Readings from Last 1 Encounters:  02/24/24 70         Passed - Valid encounter within last 12 months    Recent Outpatient Visits           4 months ago Pleuritic chest pain   [place] [facility] [place], Marsa PARAS, DO   6 months ago Essential hypertension   [place] [facility] [place], Marsa PARAS, [place]

## 2024-07-09 NOTE — Telephone Encounter (Signed)
 Requested medication (s) are due for refill today: yes  Requested medication (s) are on the active medication list: yes  Last refill:  07/01/24 45  Future visit scheduled: no  Notes to clinic:  med not delegated to NT to RF   Requested Prescriptions  Pending Prescriptions Disp Refills   oxyCODONE -acetaminophen  (PERCOCET) 10-325 MG tablet [Pharmacy Med Name: OXYCODONE -APAP 10-325 MG TAB] 45 tablet     Sig: TAKE 1 TABLET BY MOUTH EVERY 4 HOURS AS NEEDED FOR PAIN     Not Delegated - Analgesics:  Opioid Agonist Combinations Failed - 07/09/2024 11:21 AM      Failed - This refill cannot be delegated      Failed - Urine Drug Screen completed in last 360 days      Failed - Valid encounter within last 3 months    Recent Outpatient Visits           4 months ago Pleuritic chest pain   [place] [facility] Marsa PARAS, DO   6 months ago Essential hypertension   [place] [facility] [place], Marsa PARAS, DO              Signed Prescriptions Disp Refills   VENTOLIN  HFA 108 (90 Base) MCG/ACT inhaler 18 g 1    Sig: INHALE 1-2 PUFFS INTO THE LUNGS EVERY 4 HOURS AS NEEDED FOR WHEEZING OR SHORTNESS OF BREATH     Pulmonology:  Beta Agonists 2 Passed - 07/09/2024 11:21 AM      Passed - Last BP in normal range    BP Readings from Last 1 Encounters:  02/24/24 134/86         Passed - Last Heart Rate in normal range    Pulse Readings from Last 1 Encounters:  02/24/24 70         Passed - Valid encounter within last 12 months    Recent Outpatient Visits           4 months ago Pleuritic chest pain   [place] [facility] [place], Marsa PARAS, DO   6 months ago Essential hypertension   [place] [facility] [place], Marsa PARAS, [place]

## 2024-07-16 ENCOUNTER — Other Ambulatory Visit: Payer: Self-pay | Admitting: Family Medicine

## 2024-07-16 DIAGNOSIS — M5412 Radiculopathy, cervical region: Secondary | ICD-10-CM

## 2024-07-19 NOTE — Telephone Encounter (Signed)
   Requested medication (s) are on the active medication list: Yes  Last refill:  07/09/24  Future visit scheduled: No  Notes to clinic:  Not delegated.    Requested Prescriptions  Pending Prescriptions Disp Refills   oxyCODONE -acetaminophen  (PERCOCET) 10-325 MG tablet [Pharmacy Med Name: OXYCODONE -APAP 10-325 MG TAB] 45 tablet     Sig: TAKE 1 TABLET BY MOUTH EVERY 4 HOURS AS NEEDED FOR PAIN     Not Delegated - Analgesics:  Opioid Agonist Combinations Failed - 07/19/2024  1:16 PM      Failed - This refill cannot be delegated      Failed - Urine Drug Screen completed in last 360 days      Failed - Valid encounter within last 3 months    Recent Outpatient Visits           4 months ago Pleuritic chest pain   [place] [facility] Marsa PARAS, DO   6 months ago Essential hypertension   [place] [facility] [place], Marsa PARAS, [place]

## 2024-07-24 ENCOUNTER — Other Ambulatory Visit: Payer: Self-pay | Admitting: Family Medicine

## 2024-07-24 DIAGNOSIS — M5412 Radiculopathy, cervical region: Secondary | ICD-10-CM

## 2024-07-26 NOTE — Telephone Encounter (Signed)
 Requested medication (s) are due for refill today: no  Requested medication (s) are on the active medication list: yes  Last refill:  07/19/24  Future visit scheduled: no  Notes to clinic:  Unable to refill per protocol, cannot delegate.      Requested Prescriptions  Pending Prescriptions Disp Refills   oxyCODONE -acetaminophen  (PERCOCET) 10-325 MG tablet [Pharmacy Med Name: OXYCODONE -APAP 10-325 MG TAB] 45 tablet     Sig: TAKE 1 TABLET BY MOUTH EVERY 4 HOURS AS NEEDED FOR PAIN     Not Delegated - Analgesics:  Opioid Agonist Combinations Failed - 07/26/2024  4:33 PM      Failed - This refill cannot be delegated      Failed - Urine Drug Screen completed in last 360 days      Failed - Valid encounter within last 3 months    Recent Outpatient Visits           5 months ago Pleuritic chest pain   [place] [facility] Marsa PARAS, DO   6 months ago Essential hypertension   [place] [facility] [place], Marsa PARAS, [place]

## 2024-07-28 ENCOUNTER — Other Ambulatory Visit (HOSPITAL_COMMUNITY): Payer: Self-pay

## 2024-08-02 ENCOUNTER — Other Ambulatory Visit: Payer: Self-pay | Admitting: Family Medicine

## 2024-08-02 DIAGNOSIS — M5412 Radiculopathy, cervical region: Secondary | ICD-10-CM

## 2024-08-04 ENCOUNTER — Telehealth: Payer: Self-pay

## 2024-08-04 NOTE — Telephone Encounter (Signed)
 Rx sent to pharmacy today  Jeffery Officer, DO [facility] 08/04/2024, 1:13 PM

## 2024-08-04 NOTE — Telephone Encounter (Signed)
 Copied from CRM [id number]. Topic: Clinical - Prescription Issue >> Aug 04, 2024  9:23 AM Winona R wrote: Pt calling to follow up on pharmacy med refill of oxyCODONE -acetaminophen  (PERCOCET) 10-325 MG tablet Pharmacy sent request on 10/13

## 2024-08-04 NOTE — Telephone Encounter (Signed)
 LM advising Rx sent to pharmacy.

## 2024-08-12 ENCOUNTER — Other Ambulatory Visit: Payer: Self-pay | Admitting: Family Medicine

## 2024-08-12 DIAGNOSIS — M5412 Radiculopathy, cervical region: Secondary | ICD-10-CM

## 2024-08-12 NOTE — Telephone Encounter (Signed)
 Copied from CRM [id number]. Topic: Clinical - Medication Refill >> Aug 12, 2024 10:56 AM Sophia H wrote: Medication: oxyCODONE -acetaminophen  (PERCOCET) 10-325 MG tablet  Has the patient contacted their pharmacy? Yes, needs updated rx. Pharmacy sent request with no response.   This is the patient's preferred pharmacy:  [facility] - [place], [place] - [address] [place] [address] Phone: [phone] Fax: [phone]  Is this the correct pharmacy for this prescription? Yes If no, delete pharmacy and type the correct one.   Has the prescription been filled recently? Yes  Is the patient out of the medication? Yes  Has the patient been seen for an appointment in the last year OR does the patient have an upcoming appointment? Yes, seen back in May 2025.  Can we respond through MyChart? Yes  Agent: Please be advised that Rx refills may take up to 3 business days. We ask that you follow-up with your pharmacy.

## 2024-08-13 MED ORDER — OXYCODONE-ACETAMINOPHEN 10-325 MG PO TABS
1.0000 | ORAL_TABLET | ORAL | 0 refills | Status: DC | PRN
Start: 2024-08-13 — End: 2024-08-20

## 2024-08-13 NOTE — Telephone Encounter (Signed)
 Requested medication (s) are due for refill today: routing for review  Requested medication (s) are on the active medication list: yes  Last refill:  08/04/24  Future visit scheduled: no  Notes to clinic:  Unable to refill per protocol, cannot delegate.      Requested Prescriptions  Pending Prescriptions Disp Refills   oxyCODONE -acetaminophen  (PERCOCET) 10-325 MG tablet [Pharmacy Med Name: OXYCODONE -APAP 10-325 MG TAB] 45 tablet     Sig: TAKE 1 TABLET BY MOUTH EVERY 4 HOURS AS NEEDED FOR PAIN     Not Delegated - Analgesics:  Opioid Agonist Combinations Failed - 08/13/2024  3:28 PM      Failed - This refill cannot be delegated      Failed - Urine Drug Screen completed in last 360 days      Failed - Valid encounter within last 3 months    Recent Outpatient Visits           5 months ago Pleuritic chest pain   [place] [facility] [place] PARAS, DO   7 months ago Essential hypertension   [facility] [place], Marsa PARAS, [place]

## 2024-08-13 NOTE — Telephone Encounter (Signed)
 Requested medications are due for refill today.  yes  Requested medications are on the active medications list.  yes  Last refill. 08/04/2024 #45 0 rf  Future visit scheduled.   no  Notes to clinic.  Refill not delegated    Requested Prescriptions  Pending Prescriptions Disp Refills   oxyCODONE -acetaminophen  (PERCOCET) 10-325 MG tablet 45 tablet 0    Sig: Take 1 tablet by mouth every 4 (four) hours as needed. for pain     Not Delegated - Analgesics:  Opioid Agonist Combinations Failed - 08/13/2024  3:33 PM      Failed - This refill cannot be delegated      Failed - Urine Drug Screen completed in last 360 days      Failed - Valid encounter within last 3 months    Recent Outpatient Visits           5 months ago Pleuritic chest pain   [place] [facility] [place] PARAS, DO   7 months ago Essential hypertension   [place] [facility] [place], Marsa PARAS, [place]

## 2024-08-19 ENCOUNTER — Other Ambulatory Visit (HOSPITAL_COMMUNITY): Payer: Self-pay

## 2024-08-19 ENCOUNTER — Other Ambulatory Visit: Payer: Self-pay | Admitting: Family Medicine

## 2024-08-19 DIAGNOSIS — M5412 Radiculopathy, cervical region: Secondary | ICD-10-CM

## 2024-08-23 ENCOUNTER — Other Ambulatory Visit (HOSPITAL_COMMUNITY): Payer: Self-pay

## 2024-08-23 ENCOUNTER — Telehealth: Payer: Self-pay | Admitting: Pharmacy Technician

## 2024-08-23 NOTE — Telephone Encounter (Addendum)
 Pharmacy Patient Advocate Encounter   Received notification from CoverMyMeds that prior authorization for Oxycodone -acetaminophen  10-325 is required/requested.   Insurance verification completed.   The patient is insured through [employer].   Per test claim: Refill too soon. Medication was filled 08/20/24 for a 5 day supply. Next eligible fill date is 08/24/24. CMM KEY: [id number]

## 2024-08-23 NOTE — Telephone Encounter (Signed)
 It looks like last visit was >6 months ago back in May 2025. So he is overdue for an office visit.  This message looks like they need new chart notes documenting the usage of the pain medication to get approval?  He may have to pick up at his own cost out of pocket for now until he can come back to see me and document updates for the new approval PA if we can re-submit after he comes in to see me.  [place], can you schedule him ASAP for follow-up for his pain medication?  Marsa Officer, DO [facility] 08/23/2024, 2:45 PM

## 2024-08-23 NOTE — Telephone Encounter (Signed)
 Left message for patient to return call OK to schedule to see dr Larose

## 2024-08-23 NOTE — Telephone Encounter (Signed)
 Thank you for the update. Yes it should be 45 pills for 8 day supply. Ideally I am trying to get a lot of my chronic pain patients to a 14 day supply if possible . So that would be closer to 80 pills total. He should probably have a new office visit with me before we change that however.  It is okay to do authorization for 45 pills = 8 day supply for now if you can.  Thanks  Marsa Officer, DO [facility] 08/23/2024, 11:16 AM

## 2024-08-23 NOTE — Telephone Encounter (Signed)
 Pharmacy Patient Advocate Encounter     PA required; PA started via CoverMyMeds. KEY [id number] . Please see clinical question(s) below that I am not finding the answer to in their chart and advise. Please provide the reason for exceeding duration (days supply) limits. Please review the previous denial. This is what it says:  because we did not see certain details about your illness and treatment. We see that this request is for a drug called oxycodone -acetaminophen  10-325 milligrams tablet for your illness (cervical foraminal stenosis). We may consider continued approval of this drug when we see certain records (documentation as to why you need continued opioid treatment and a current plan of care). We did not see such records (documentation may include, but is not limited to, chart notes,  prescription claims records, prescription receipts, and laboratory data).

## 2024-08-26 ENCOUNTER — Other Ambulatory Visit: Payer: Self-pay | Admitting: Family Medicine

## 2024-08-26 DIAGNOSIS — M5412 Radiculopathy, cervical region: Secondary | ICD-10-CM

## 2024-08-27 NOTE — Telephone Encounter (Signed)
 Requested medication (s) are due for refill today: yes  Requested medication (s) are on the active medication list: yes  Last refill:    Future visit scheduled: yes  Notes to clinic:  Unable to refill per protocol, cannot delegate.      Requested Prescriptions  Pending Prescriptions Disp Refills   oxyCODONE -acetaminophen  (PERCOCET) 10-325 MG tablet [Pharmacy Med Name: OXYCODONE -APAP 10-325 MG TAB] 45 tablet     Sig: TAKE 1 TABLET BY MOUTH EVERY 4 HOURS AS NEEDED FOR PAIN AS DIRECTED     Not Delegated - Analgesics:  Opioid Agonist Combinations Failed - 08/27/2024  4:12 PM      Failed - This refill cannot be delegated      Failed - Urine Drug Screen completed in last 360 days      Failed - Valid encounter within last 3 months    Recent Outpatient Visits           6 months ago Pleuritic chest pain   [place] [facility] [place] Marsa PARAS, DO   7 months ago Essential hypertension   [place] [facility] [place], Marsa PARAS, [place]

## 2024-08-31 ENCOUNTER — Ambulatory Visit: Admitting: Family Medicine

## 2024-08-31 ENCOUNTER — Other Ambulatory Visit: Payer: Self-pay | Admitting: Family Medicine

## 2024-08-31 ENCOUNTER — Encounter: Payer: Self-pay | Admitting: Family Medicine

## 2024-08-31 VITALS — BP 132/84 | HR 103 | Ht 70.0 in | Wt 145.0 lb

## 2024-08-31 DIAGNOSIS — R7309 Other abnormal glucose: Secondary | ICD-10-CM

## 2024-08-31 DIAGNOSIS — I10 Essential (primary) hypertension: Secondary | ICD-10-CM

## 2024-08-31 DIAGNOSIS — M5412 Radiculopathy, cervical region: Secondary | ICD-10-CM

## 2024-08-31 DIAGNOSIS — Z0000 Encounter for general adult medical examination without abnormal findings: Secondary | ICD-10-CM

## 2024-08-31 DIAGNOSIS — Z125 Encounter for screening for malignant neoplasm of prostate: Secondary | ICD-10-CM

## 2024-08-31 DIAGNOSIS — F419 Anxiety disorder, unspecified: Secondary | ICD-10-CM

## 2024-08-31 DIAGNOSIS — E559 Vitamin D deficiency, unspecified: Secondary | ICD-10-CM

## 2024-08-31 DIAGNOSIS — E7800 Pure hypercholesterolemia, unspecified: Secondary | ICD-10-CM

## 2024-08-31 MED ORDER — OXYCODONE-ACETAMINOPHEN 10-325 MG PO TABS
1.0000 | ORAL_TABLET | ORAL | 0 refills | Status: DC | PRN
Start: 2024-08-31 — End: 2024-09-14

## 2024-08-31 MED ORDER — AMLODIPINE BESYLATE 5 MG PO TABS: 5.0000 mg | ORAL_TABLET | Freq: Every day | ORAL | 3 refills | Status: AC

## 2024-08-31 NOTE — Patient Instructions (Addendum)
 Thank you for coming to the office today.  Recommend that you contact Whitney Meeler NP / [facility] for further evaluation after the MRI and possible further injections.  Meeler, Sheridan, NP  [address]  [place], [place] [address]  Phone: [phone]  We will try to get 14 day supply 90 pills covered with new authorization, stay tuned, otherwise it may have to be lowered back to 45 pills per week.  Try to fill on Friday for the 2 week  supply, 90 day. Stay tuned.  DUE for FASTING BLOOD WORK (no food or drink after midnight before the lab appointment, only water or coffee without cream/sugar on the morning of)  SCHEDULE Lab Only visit in the morning at the clinic for lab draw in 6 MONTHS   - Make sure Lab Only appointment is at about 1 week before your next appointment, so that results will be available  For Lab Results, once available within 2-3 days of blood draw, you can can log in to MyChart online to view your results and a brief explanation. Also, we can discuss results at next follow-up visit.   Please schedule a Follow-up Appointment to: Return in about 6 months (around 02/28/2025) for 6 month fasting lab > 1 week later Annual Physical.  If you have any other questions or concerns, please feel free to call the office or send a message through MyChart. You may also schedule an earlier appointment if necessary.  Additionally, you may be receiving a survey about your experience at our office within a few days to 1 week by e-mail or mail. We value your feedback.  Marsa Officer, DO [facility], [place]

## 2024-08-31 NOTE — Progress Notes (Signed)
 Subjective:    Patient ID: Jeffery King, male    DOB: Apr 22, 1976, 48 y.o.   MRN: [id number]  Jeffery King is a 48 y.o. male presenting on 08/31/2024 for Medical Management of Chronic Issues   HPI  Discussed the use of AI scribe software for clinical note transcription with the patient, who gave verbal consent to proceed.  History of Present Illness   [name] is a 48 year old male who presents for medication follow-up.  Cervical radiculopathy and upper extremity neuropathic pain - Episodic shooting pain in the left arm, recently recurred after a period of stability - Pain radiates down the left arm and occasionally affects the right arm, with right arm symptoms resolving the next day - Pain intensity typically 4 to 5 out of 10 - History of cervical spine involvement at C3, C4, C5, and C6 - Associated symptoms include finger numbness, throbbing, and a sensation of swelling at the fingertip (now resolved)  Analgesic use and medication access - Currently prescribed acetaminophen  10-325 mg, maximum six pills per day - Medication usage varies; sometimes has leftover medication, other times uses full prescription - Insurance coverage issues require periodic authorizations for medication  Anxiety - Recent increase in nervousness despite feeling mentally well - Mood generally good - History of fluctuating anxiety scores - Attributes increased anxiety to ongoing healing process  Blood pressure management - Blood pressure remains stable, with recent readings around 132/84 mmHg - Attributes previous hypertension to stress and weight issues - Amlodipine  5mg  daily  Cardiac risk assessment - History of heart scan orders, test not yet completed         08/31/2024    2:38 PM 02/24/2024    2:48 PM 01/06/2024    3:43 PM  Depression screen PHQ 2/9  Decreased Interest 0 1 0  Down, Depressed, Hopeless 0 0 0  PHQ - 2 Score 0 1 0  Altered sleeping 0 1 0  Tired, decreased energy 0 1 1   Change in appetite 0 0 0  Feeling bad or failure about yourself  0 0 0  Trouble concentrating 0 1 0  Moving slowly or fidgety/restless 0 0 0  Suicidal thoughts 0 0 0  PHQ-9 Score 0 4  1   Difficult doing work/chores Not difficult at all Somewhat difficult Somewhat difficult     Data saved with a previous flowsheet row definition       08/31/2024    2:58 PM 02/24/2024    2:48 PM 01/06/2024    3:43 PM 03/20/2023    9:40 AM  GAD 7 : Generalized Anxiety Score  Nervous, Anxious, on Edge 1 0 0 1  Control/stop worrying 0 1 0 1  Worry too much - different things 1 1 0 1  Trouble relaxing 1 1 0 3  Restless 0 1 0 1  Easily annoyed or irritable 1 1 0 2  Afraid - awful might happen 1 1 0 2  Total GAD 7 Score 5 6 0 11  Anxiety Difficulty Somewhat difficult Somewhat difficult      Social History   Tobacco Use   Smoking status: Former    Current packs/day: 0.00    Types: Cigarettes    Quit date: 05/30/2014    Years since quitting: 10.2   Smokeless tobacco: Former  Building Services Engineer status: Never Used  Substance Use Topics   Alcohol use: No   Drug use: No    Review of Systems  Per HPI unless specifically indicated above     Objective:    BP 132/84 (BP Location: Left Arm, Patient Position: Sitting, Cuff Size: Normal)   Pulse (!) 103   Ht 5' 10 (1.778 m)   Wt 145 lb (65.8 kg)   SpO2 98%   BMI 20.81 kg/m   Wt Readings from Last 3 Encounters:  08/31/24 145 lb (65.8 kg)  02/24/24 162 lb 8 oz (73.7 kg)  01/06/24 157 lb (71.2 kg)    Physical Exam Vitals and nursing note reviewed.  Constitutional:      General: He is not in acute distress.    Appearance: Normal appearance. He is well-developed. He is not diaphoretic.     Comments: Well-appearing, mild neck discomfort, cooperative  HENT:     Head: Normocephalic and atraumatic.  Eyes:     General:        Right eye: No discharge.        Left eye: No discharge.     Conjunctiva/sclera: Conjunctivae normal.  Neck:      Comments: Some mild reduced range of motion and hypertonicity Cardiovascular:     Rate and Rhythm: Normal rate.  Pulmonary:     Effort: Pulmonary effort is normal.  Skin:    General: Skin is warm and dry.     Findings: No erythema or rash.  Neurological:     Mental Status: He is alert and oriented to person, place, and time.  Psychiatric:        Mood and Affect: Mood normal.        Behavior: Behavior normal.        Thought Content: Thought content normal.     Comments: Well groomed, good eye contact, normal speech and thoughts     I have personally reviewed the radiology report from 12/26/23 on MRI Cervical Spine.  CLINICAL DATA:  Neck pain.  Bilateral upper extremity pain.   EXAM: MRI CERVICAL SPINE WITHOUT CONTRAST   TECHNIQUE: Multiplanar, multisequence MR imaging of the cervical spine was performed. No intravenous contrast was administered.   COMPARISON:  MRI cervical spine 05/09/2016   FINDINGS: Alignment: No significant listhesis is present. Cervical lordosis is preserved.   Vertebrae: Marrow signal and vertebral body heights are normal.   Cord: Normal signal and morphology.   Posterior Fossa, vertebral arteries, paraspinal tissues: Craniocervical junction is normal. Flow is present in the vertebral arteries bilaterally. Visualized intracranial contents are normal.   Disc levels:   C2-3: Negative.   C3-4: Mild left foraminal narrowing is secondary to uncovertebral and facet hypertrophy, new since the prior exam.   C4-5: Negative.   C5-6: A broad-based disc osteophyte complex effaces the ventral CSF. Moderate foraminal stenosis bilaterally is similar the prior exam.   C6-7: A broad-based disc osteophyte complex has progressed some since the prior exam. Partial effacement of ventral CSF is present. Mild left foraminal narrowing is present.   C7-T1: Negative.   IMPRESSION: 1. Mild left foraminal narrowing at C3-4 is new since the prior exam. 2.  Moderate foraminal stenosis bilaterally at C5-6 is similar the prior exam. 3. Mild left foraminal narrowing at C6-7. 4. Partial effacement of ventral CSF at C5-6 and C6-7, consistent with mild central canal stenosis, greatest at C5-6.     Electronically Signed   By: Lonni Necessary M.D.   On: 12/26/2023 14:38  Results for orders placed or performed in visit on 02/11/24  VITAMIN D  25 Hydroxy (Vit-D Deficiency, Fractures)   Collection Time: 02/11/24  8:12 AM  Result Value Ref Range   Vit D, 25-Hydroxy 23 (L) 30 - 100 ng/mL  PSA   Collection Time: 02/11/24  8:12 AM  Result Value Ref Range   PSA 0.70 < OR = 4.00 ng/mL  COMPLETE METABOLIC PANEL WITH GFR   Collection Time: 02/11/24  8:12 AM  Result Value Ref Range   Glucose, Bld 93 65 - 99 mg/dL   BUN 10 7 - 25 mg/dL   Creat 9.11 9.39 - 8.70 mg/dL   BUN/Creatinine Ratio SEE NOTE: 6 - 22 (calc)   Sodium 143 135 - 146 mmol/L   Potassium 4.8 3.5 - 5.3 mmol/L   Chloride 105 98 - 110 mmol/L   CO2 31 20 - 32 mmol/L   Calcium 9.3 8.6 - 10.3 mg/dL   Total Protein 6.6 6.1 - 8.1 g/dL   Albumin 4.2 3.6 - 5.1 g/dL   Globulin 2.4 1.9 - 3.7 g/dL (calc)   AG Ratio 1.8 1.0 - 2.5 (calc)   Total Bilirubin 0.3 0.2 - 1.2 mg/dL   Alkaline phosphatase (APISO) 63 36 - 130 U/L   AST 31 10 - 40 U/L   ALT 39 9 - 46 U/L  CBC with Differential/Platelet   Collection Time: 02/11/24  8:12 AM  Result Value Ref Range   WBC 5.3 3.8 - 10.8 Thousand/uL   RBC 5.00 4.20 - 5.80 Million/uL   Hemoglobin 15.3 13.2 - 17.1 g/dL   HCT 53.7 61.4 - 49.9 %   MCV 92.4 80.0 - 100.0 fL   MCH 30.6 27.0 - 33.0 pg   MCHC 33.1 32.0 - 36.0 g/dL   RDW 87.4 88.9 - 84.9 %   Platelets 190 140 - 400 Thousand/uL   MPV 10.9 7.5 - 12.5 fL   Neutro Abs 2,984 1,500 - 7,800 cells/uL   Absolute Lymphocytes 1,611 850 - 3,900 cells/uL   Absolute Monocytes 663 200 - 950 cells/uL   Eosinophils Absolute 11 (L) 15 - 500 cells/uL   Basophils Absolute 32 0 - 200 cells/uL   Neutrophils  Relative % 56.3 %   Total Lymphocyte 30.4 %   Monocytes Relative 12.5 %   Eosinophils Relative 0.2 %   Basophils Relative 0.6 %  Hemoglobin A1c   Collection Time: 02/11/24  8:12 AM  Result Value Ref Range   Hgb A1c MFr Bld 5.4 <5.7 %   Mean Plasma Glucose 108 mg/dL   eAG (mmol/L) 6.0 mmol/L  Lipid panel   Collection Time: 02/11/24  8:12 AM  Result Value Ref Range   Cholesterol 166 <200 mg/dL   HDL 76 > OR = 40 mg/dL   Triglycerides 859 <849 mg/dL   LDL Cholesterol (Calc) 68 mg/dL (calc)   Total CHOL/HDL Ratio 2.2 <5.0 (calc)   Non-HDL Cholesterol (Calc) 90 <869 mg/dL (calc)      Assessment & Plan:   Problem List Items Addressed This Visit     Anxiety   Cervical radiculopathy - Primary   Essential hypertension   Relevant Medications   amLODipine  (NORVASC ) 5 MG tablet     Cervical radiculopathy Chronic Neck Pain Opioid Pain Management Followed by [facility], no recent apt. Last 12/2023. He will need to call to schedule follow-up evaluation post-MRI and possible injections. He has completed Cervical Spine MRI already 12/2023 Intermittent arm pain with recent spontaneous resolution of finger symptoms.   I have continued to manage his opioid therapy for maintenance and functional improvement while he pursued non surgical approach with Physiatry and specialists.  He will warrant further management and procedures such as ESI injection  - He is on high dose Oxycodone -Acetaminophen  10/325 1 tab q 4 hr as needed max 6 per day = 45 tab for 7 days, and we discussed agreement to inc quantity for 14 days = 84 pills instead of weekly fills.  New Opioid Controlled Substance Agreement printed, reviewed, and signed today  He is also on non opioid therapy acetaminophen , Methocarbamol   - Follow-up in six months for physical and blood work.  Anxiety disorder Stable chronic problem Remains off Benzodiazpine Continue current management plan.  Essential hypertension Blood  pressure well-controlled with lifestyle changes Amlodipine  5mg  daily     Try to get Prior Authorization for 84 pills every 14 days Route to PA RX TEAM  No orders of the defined types were placed in this encounter.   Meds ordered this encounter  Medications   amLODipine  (NORVASC ) 5 MG tablet    Sig: Take 1 tablet (5 mg total) by mouth daily.    Dispense:  90 tablet    Refill:  3    Follow up plan: Return in about 6 months (around 02/28/2025) for 6 month fasting lab > 1 week later Annual Physical.  Future labs ordered for 02/23/25   Marsa Officer, DO [facility] 08/31/2024, 2:56 PM

## 2024-09-02 ENCOUNTER — Other Ambulatory Visit (HOSPITAL_COMMUNITY): Payer: Self-pay

## 2024-09-02 NOTE — Telephone Encounter (Signed)
 Prior Authorization form/request asks a question that requires your assistance. Please see the question below and advise accordingly. The PA will not be submitted until the necessary information is received.  What is the clinical reason for requesting a dosage frequency or a quantity that is greater than the FDA recommendation?    And, just so you are aware, I answered yes to these questions, assuming you were aware of them. Please let me know if any of them are incorrect.  *Has the prescribing clinician reviewed the [place]  Medical Board statement on use of controlled substances for the treatment of pain?  *Is the prescribing clinician adhering as medically appropriate to the guidelines which include: (a) complete patient evaluation, (b) establishment of a treatment plan (contract), (c) informed consent, (d) periodic review, and (e) consultation with specialists in various treatment modalities as appropriate?  *Has the prescribing clinician checked the patient's utilization of controlled substances on the [place] Controlled Substance Reporting System?  *Has the prescribing clinician reviewed the CDC Guideline for Prescribing Opioids for Chronic Pain [place] , 2016 (lawyernetworking.com.cy) and the [facility] Clinical Practice Guideline for Prescribing Opioids for Pain [place] , 2022 (http://www.lowery.com/)?

## 2024-09-02 NOTE — Telephone Encounter (Signed)
 Yes, thank you. You answered correctly. Yes to all of those questions.  Also I am not sure if it is reflected well on the PA, but this is not a dosage increase. It is a quantity change from 7 day supply to a 14 day supply at the same dose. I am not able to keep up with a 7 day rx only long term unfortunately. Since he has been stable on his dose for a long time now, I am recommending he can fill it every 14 days now with higher quantity instead of every 7 days with the lower quantity.  I have seen before where some patients still only get so many day supply / quantity covered, and the patient will pay for the remaining quantity out of pocket. This may be possible if needed. But hopefully they will cover 14 day supply.  Marsa Officer, DO [facility] 09/02/2024, 11:38 AM

## 2024-09-06 ENCOUNTER — Other Ambulatory Visit (HOSPITAL_COMMUNITY): Payer: Self-pay

## 2024-09-06 NOTE — Telephone Encounter (Signed)
 Clinical questions have been answered and PA submitted. PA currently Pending. Please be advised that most companies allow up to 30 days to make a decision. We will advise when a determination has been made, or follow up in 1 week.   Please reach out to our team, Rx Prior Auth Pool, if you haven't heard back in a week.

## 2024-09-07 ENCOUNTER — Other Ambulatory Visit (HOSPITAL_COMMUNITY): Payer: Self-pay

## 2024-09-07 NOTE — Telephone Encounter (Signed)
 Pharmacy Patient Advocate Encounter  Received notification from [employer] that Prior Authorization for Oxycodone -Acetaminophen  10-325 mg has been APPROVED from 09/06/24 to 03/05/25. Ran test claim, Copay is $4. This test claim was processed through [facility]- copay amounts may vary at other pharmacies due to pharmacy/plan contracts, or as the patient moves through the different stages of their insurance plan.   PA #/Case ID/Reference #: [id number]

## 2024-09-13 ENCOUNTER — Other Ambulatory Visit: Payer: Self-pay | Admitting: Family Medicine

## 2024-09-13 DIAGNOSIS — M5412 Radiculopathy, cervical region: Secondary | ICD-10-CM

## 2024-09-14 NOTE — Telephone Encounter (Signed)
 Requested medications are due for refill today.  yes  Requested medications are on the active medications list.  yes  Last refill. 08/31/2024 #84 0 rf  Future visit scheduled.   yes  Notes to clinic.  Refill not delegated.    Requested Prescriptions  Pending Prescriptions Disp Refills   oxyCODONE -acetaminophen  (PERCOCET) 10-325 MG tablet [Pharmacy Med Name: OXYCODONE -APAP 10-325 MG TAB] 84 tablet     Sig: TAKE 1 TABLET BY MOUTH EVERY 4 HOURS AS NEEDED     Not Delegated - Analgesics:  Opioid Agonist Combinations Failed - 09/14/2024  3:01 PM      Failed - This refill cannot be delegated      Failed - Urine Drug Screen completed in last 360 days      Passed - Valid encounter within last 3 months    Recent Outpatient Visits           2 weeks ago Cervical radiculopathy   [place] [facility] [place]a PARAS, [place]   6 months ago Pleuritic chest pain   [place] [facility] [place]a PARAS, [place]   8 months ago Essential hypertension   [place] [facility] [place], [place]a PARAS, [place]

## 2024-09-27 ENCOUNTER — Other Ambulatory Visit: Payer: Self-pay | Admitting: Family Medicine

## 2024-09-27 DIAGNOSIS — M5412 Radiculopathy, cervical region: Secondary | ICD-10-CM

## 2024-09-28 NOTE — Telephone Encounter (Signed)
 Requested medications are due for refill today.  2 week supply given on 09/14/2024  Requested medications are on the active medications list.  yes  Last refill. 09/14/2024 #84 0 rf  Future visit scheduled.   yes  Notes to clinic.  Refill not delegated.    Requested Prescriptions  Pending Prescriptions Disp Refills   oxyCODONE -acetaminophen  (PERCOCET) 10-325 MG tablet [Pharmacy Med Name: OXYCODONE -APAP 10-325 MG TAB] 84 tablet     Sig: TAKE 1 TABLET BY MOUTH EVERY 4 HOURS AS NEEDED     Not Delegated - Analgesics:  Opioid Agonist Combinations Failed - 09/28/2024  6:00 PM      Failed - This refill cannot be delegated      Failed - Urine Drug Screen completed in last 360 days      Passed - Valid encounter within last 3 months    Recent Outpatient Visits           4 weeks ago Cervical radiculopathy   [facility] [place] PARAS, [place]   7 months ago Pleuritic chest pain   [facility] [place] PARAS, [place]   8 months ago Essential hypertension   [place] [facility] [place], [place]a PARAS, [place]

## 2024-10-01 ENCOUNTER — Telehealth: Payer: Self-pay

## 2024-10-01 NOTE — Telephone Encounter (Signed)
 Typically I would suggest a MyChart Virtual Video Visit for this type of issue. I need to have it formally documented into the chart or in a mychart conversation message.  If he does the virtual visit there is no additional fee for the form. If he does not do the visit, there is a fee for the form $20 I think.  If he does not do the visit, he can send a message or you can ask him questions about the following:  Need to know the pet's name and what type of pet, usually cat or dog etc, and if dog would need to know the breed / type of dog (this is important for them to know the size of the animal) Also need to ask for an example of in what way does his pet provide emotional support? - this can be an example such as - playing with them or walking a dog can help improve his mood or reduce anxiety, has a calming effect on him, etc just need a specific example or two.  Also confirm the diagnosis - we will be using Generalized Anxiety Disorder as the diagnosis.  Let me know if he schedules apt then we don't need to ask everything, or if he does not schedule - it is okay to ask these questions and I can write the letter later and send to [facility] and he can pick up and he will be billed for the letter.  Thanks  Marsa Officer, DO [facility] 10/01/2024, 2:51 PM

## 2024-10-01 NOTE — Telephone Encounter (Signed)
 Copied from CRM #[id number]. Topic: General - Other >> Oct 01, 2024  1:55 PM Delon HERO wrote: Reason for CRM: Patient is calling to request a letter to be written to [employer] stating his dx, and how long with mental health - and stating that an emotional support animal would be beneficial for his mental health. Please advise when the letter is completed. Please upload the letter into Mychart and contact the patient for pick up.

## 2024-10-01 NOTE — Telephone Encounter (Signed)
 Spoke to patient, appointment scheduled for Monday virtually

## 2024-10-04 ENCOUNTER — Encounter: Payer: Self-pay | Admitting: Family Medicine

## 2024-10-04 ENCOUNTER — Ambulatory Visit: Admitting: Family Medicine

## 2024-10-04 VITALS — BP 134/78 | HR 98 | Ht 70.0 in | Wt 150.4 lb

## 2024-10-04 DIAGNOSIS — F419 Anxiety disorder, unspecified: Secondary | ICD-10-CM | POA: Diagnosis not present

## 2024-10-04 NOTE — Patient Instructions (Addendum)
 Thank you for coming to the office today.  Letter for Emotional Support Animal  Signed as witness on the Reasonable Accommodation request form.  Please schedule a Follow-up Appointment to: Return if symptoms worsen or fail to improve.  If you have any other questions or concerns, please feel free to call the office or send a message through MyChart. You may also schedule an earlier appointment if necessary.  Additionally, you may be receiving a survey about your experience at our office within a few days to 1 week by e-mail or mail. We value your feedback.  Marsa Officer, DO [facility], [place]

## 2024-10-04 NOTE — Progress Notes (Unsigned)
 Subjective:    Patient ID: Jeffery King, male    DOB: May 01, 1976, 48 y.o.   MRN: [id number]  Jeffery King is a 48 y.o. male presenting on 10/04/2024 for Medical Management of Chronic Issues   HPI  Anxiety - Recent increase in nervousness despite feeling mentally well - Mood generally good - History of fluctuating anxiety scores - Attributes increased anxiety to ongoing healing process  *** Pet Dog, Montey Paris Cable,    Health Maintenance: ***     10/04/2024    2:09 PM 08/31/2024    2:38 PM 02/24/2024    2:48 PM  Depression screen PHQ 2/9  Decreased Interest 1 0 1  Down, Depressed, Hopeless 1 0 0  PHQ - 2 Score 2 0 1  Altered sleeping 1 0 1  Tired, decreased energy 1 0 1  Change in appetite 1 0 0  Feeling bad or failure about yourself  1 0 0  Trouble concentrating 2 0 1  Moving slowly or fidgety/restless 1 0 0  Suicidal thoughts 0 0 0  PHQ-9 Score 9 0 4   Difficult doing work/chores Somewhat difficult Not difficult at all Somewhat difficult     Data saved with a previous flowsheet row definition       10/04/2024    2:10 PM 08/31/2024    2:58 PM 02/24/2024    2:48 PM 01/06/2024    3:43 PM  GAD 7 : Generalized Anxiety Score  Nervous, Anxious, on Edge 2 1 0 0  Control/stop worrying 2 0 1 0  Worry too much - different things 2 1 1  0  Trouble relaxing 2 1 1  0  Restless 2 0 1 0  Easily annoyed or irritable 2 1 1  0  Afraid - awful might happen 2 1 1  0  Total GAD 7 Score 14 5 6  0  Anxiety Difficulty Somewhat difficult Somewhat difficult Somewhat difficult     Social History[1]  Review of Systems Per HPI unless specifically indicated above     Objective:    BP (!) 140/90 (BP Location: Left Arm, Patient Position: Sitting, Cuff Size: Normal)   Pulse 98   Ht 5' 10 (1.778 m)   Wt 150 lb 6 oz (68.2 kg)   SpO2 99%   BMI 21.58 kg/m   Wt Readings from Last 3 Encounters:  10/04/24 150 lb 6 oz (68.2 kg)  08/31/24 145 lb (65.8 kg)  02/24/24 162 lb 8 oz (73.7  kg)    Physical Exam  Results for orders placed or performed in visit on 02/11/24  VITAMIN D  25 Hydroxy (Vit-D Deficiency, Fractures)   Collection Time: 02/11/24  8:12 AM  Result Value Ref Range   Vit D, 25-Hydroxy 23 (L) 30 - 100 ng/mL  PSA   Collection Time: 02/11/24  8:12 AM  Result Value Ref Range   PSA 0.70 < OR = 4.00 ng/mL  COMPLETE METABOLIC PANEL WITH GFR   Collection Time: 02/11/24  8:12 AM  Result Value Ref Range   Glucose, Bld 93 65 - 99 mg/dL   BUN 10 7 - 25 mg/dL   Creat 9.11 9.39 - 8.70 mg/dL   BUN/Creatinine Ratio SEE NOTE: 6 - 22 (calc)   Sodium 143 135 - 146 mmol/L   Potassium 4.8 3.5 - 5.3 mmol/L   Chloride 105 98 - 110 mmol/L   CO2 31 20 - 32 mmol/L   Calcium 9.3 8.6 - 10.3 mg/dL   Total Protein 6.6 6.1 - 8.1  g/dL   Albumin 4.2 3.6 - 5.1 g/dL   Globulin 2.4 1.9 - 3.7 g/dL (calc)   AG Ratio 1.8 1.0 - 2.5 (calc)   Total Bilirubin 0.3 0.2 - 1.2 mg/dL   Alkaline phosphatase (APISO) 63 36 - 130 U/L   AST 31 10 - 40 U/L   ALT 39 9 - 46 U/L  CBC with Differential/Platelet   Collection Time: 02/11/24  8:12 AM  Result Value Ref Range   WBC 5.3 3.8 - 10.8 Thousand/uL   RBC 5.00 4.20 - 5.80 Million/uL   Hemoglobin 15.3 13.2 - 17.1 g/dL   HCT 53.7 61.4 - 49.9 %   MCV 92.4 80.0 - 100.0 fL   MCH 30.6 27.0 - 33.0 pg   MCHC 33.1 32.0 - 36.0 g/dL   RDW 87.4 88.9 - 84.9 %   Platelets 190 140 - 400 Thousand/uL   MPV 10.9 7.5 - 12.5 fL   Neutro Abs 2,984 1,500 - 7,800 cells/uL   Absolute Lymphocytes 1,611 850 - 3,900 cells/uL   Absolute Monocytes 663 200 - 950 cells/uL   Eosinophils Absolute 11 (L) 15 - 500 cells/uL   Basophils Absolute 32 0 - 200 cells/uL   Neutrophils Relative % 56.3 %   Total Lymphocyte 30.4 %   Monocytes Relative 12.5 %   Eosinophils Relative 0.2 %   Basophils Relative 0.6 %  Hemoglobin A1c   Collection Time: 02/11/24  8:12 AM  Result Value Ref Range   Hgb A1c MFr Bld 5.4 <5.7 %   Mean Plasma Glucose 108 mg/dL   eAG (mmol/L) 6.0 mmol/L   Lipid panel   Collection Time: 02/11/24  8:12 AM  Result Value Ref Range   Cholesterol 166 <200 mg/dL   HDL 76 > OR = 40 mg/dL   Triglycerides 859 <849 mg/dL   LDL Cholesterol (Calc) 68 mg/dL (calc)   Total CHOL/HDL Ratio 2.2 <5.0 (calc)   Non-HDL Cholesterol (Calc) 90 <869 mg/dL (calc)      Assessment & Plan:   Problem List Items Addressed This Visit   None    ***  [facility] for Reasonable accommodation Mental health Anxiety Pet Pocliy  No orders of the defined types were placed in this encounter.   No orders of the defined types were placed in this encounter.   Follow up plan: No follow-ups on file.  Future labs ordered for ***   Marsa Officer, DO [facility] 10/04/2024, 2:18 PM    [1]  Social History Tobacco Use   Smoking status: Former    Current packs/day: 0.00    Types: Cigarettes    Quit date: 05/30/2014    Years since quitting: 10.3   Smokeless tobacco: Former  Building Services Engineer status: Never Used  Substance Use Topics   Alcohol use: No   Drug use: No

## 2024-10-11 ENCOUNTER — Other Ambulatory Visit: Payer: Self-pay | Admitting: Family Medicine

## 2024-10-11 DIAGNOSIS — M5412 Radiculopathy, cervical region: Secondary | ICD-10-CM

## 2024-10-13 NOTE — Telephone Encounter (Signed)
 Requested medication (s) are due for refill today: Yes  Requested medication (s) are on the active medication list: Yes  Last refill:  09/29/24  Future visit scheduled: Yes  Notes to clinic:  Unable to refill per protocol, cannot delegate.      Requested Prescriptions  Pending Prescriptions Disp Refills   oxyCODONE -acetaminophen  (PERCOCET) 10-325 MG tablet [Pharmacy Med Name: OXYCODONE -APAP 10-325 MG TAB] 84 tablet     Sig: TAKE 1 TABLET BY MOUTH EVERY 4 HOURS AS NEEDED     Not Delegated - Analgesics:  Opioid Agonist Combinations Failed - 10/13/2024 12:29 PM      Failed - This refill cannot be delegated      Failed - Urine Drug Screen completed in last 360 days      Passed - Valid encounter within last 3 months    Recent Outpatient Visits           1 week ago Anxiety   [place] [facility] [place] Marsa PARAS, [place]   1 month ago Cervical radiculopathy   [facility] [facility] PARAS, [place]   7 months ago Pleuritic chest pain   [place] [facility] Marsa PARAS, DO   9 months ago Essential hypertension   [place] [facility] [place], Marsa PARAS, [place]

## 2024-10-19 ENCOUNTER — Telehealth: Payer: Self-pay

## 2024-10-19 ENCOUNTER — Other Ambulatory Visit: Payer: Self-pay | Admitting: Family Medicine

## 2024-10-19 DIAGNOSIS — M5412 Radiculopathy, cervical region: Secondary | ICD-10-CM

## 2024-10-19 MED ORDER — OXYCODONE-ACETAMINOPHEN 10-325 MG PO TABS: 1.0000 | ORAL_TABLET | ORAL | 0 refills | Status: DC | PRN

## 2024-10-19 NOTE — Telephone Encounter (Signed)
 Copied from CRM [id number]. 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  Patient can be reached at [phone]

## 2024-10-19 NOTE — Telephone Encounter (Signed)
 Please call him to let him know that his letter and new opioid contract have been written, printed and signed. He can come by anytime we are open starting Weds 12/31 to pick up his letter and sign the new opioid agreement. He can leave the opoid agreement here so we can scan it into the system. He just needs to pick up the letter.  New rx sent to his pharmacy for 10/29/24.  ---------------------------------------------  Do not need to share the rest info with patient - he already knows this info, it is a summary of my call with him. My notes below are just for documentation purposes only.  I have called him and discussed this situation.   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 I have reviewed his past chart while on oxycodone  in the past 1 year 06/2023 to current and I advised him that with a frequency or quantity reduction and max limit per day and avoid use of opioid during driving hours, he would be potentially eligible to pursue CDL / DOT certification. 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  He acknowledges he has not taken the opioid as regular lately and is doing better. He has been managed lately on max 3 tablets per day, and so I have updated his rx to reflect this change quantity down from 84 down to 42. For 14 day supply  Directions:Take 1 tablet by mouth every 4 (four) hours as needed. Avoid use while driving. Max 3 doses in 24 hours.  New opioid agreement and letter both completed.   Marsa Officer, DO [facility] 10/19/2024, 6:01 PM

## 2024-10-20 ENCOUNTER — Telehealth: Payer: Self-pay

## 2024-10-20 DIAGNOSIS — R110 Nausea: Secondary | ICD-10-CM

## 2024-10-20 MED ORDER — ONDANSETRON 4 MG PO TBDP: 4.0000 mg | ORAL_TABLET | Freq: Three times a day (TID) | ORAL | 2 refills | Status: AC | PRN

## 2024-10-20 NOTE — Telephone Encounter (Signed)
 Called him back I sent Zofran  ODT 4mg  as needed and he will try to taper oxycodone  from 4 to 3 tab over next 2-4 weeks. Follow up as needed  Marsa Officer, DO [facility] 10/20/2024, 5:52 PM

## 2024-10-20 NOTE — Telephone Encounter (Signed)
 Copied from CRM #[id number]. Topic: Clinical - Medication Question >> Oct 20, 2024  3:25 PM Willma R wrote: Reason for CRM: Patient calling to ask additional questions about changing the dose of his oxyCODONE -acetaminophen  (PERCOCET) 10-325 MG tablet. Is concerned about having withdrawals and wants to know if something like suboxone would help and if he would be able to take it with the pain medication.  Patient can be reached at [phone]

## 2024-10-20 NOTE — Addendum Note (Signed)
 Addended by: EDMAN MARSA PARAS on: 10/20/2024 05:52 PM   Modules accepted: Orders

## 2024-10-20 NOTE — Telephone Encounter (Signed)
 Patient notified to come by the office and pick up the letter he needs and to sign the new agreement.

## 2024-10-20 NOTE — Telephone Encounter (Signed)
 I understand his concern. 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 He was on max 6 per day then this would be max 3 per day.   He told me on the phone yesterday that he has not been taking 6 per day, he has been taking less, and he has leftover medication.   He can go to max 5 per day for 1 or 2 weeks, then max 4 per day for 1 or 2 weeks, then down to max 3 per day according to the new rx.  Can you clarify how many tablets he has left over and if he has sufficient quantity to make this work?  Let me know what he says on the quantity if he has enough supply to make this work. If he doesn't and requires us  to change the plan with the future Rx for 10/29/24, it would require a new Rx and cancel the old one. But that may interfere with the CDL plan and the letter, because his Rx will not match. But it is possible for a one time change as he tapers down.  Also, please let him know that I cannot prescribe Suboxone here. He would need to locate a Suboxone clinic to manage that treatment plan if he is considering switching to Suboxone instead. Cannot be on both.   Marsa Officer, DO [facility] 10/20/2024, 3:57 PM

## 2024-10-27 ENCOUNTER — Other Ambulatory Visit: Payer: Self-pay | Admitting: Family Medicine

## 2024-10-27 DIAGNOSIS — M5412 Radiculopathy, cervical region: Secondary | ICD-10-CM

## 2024-10-28 NOTE — Telephone Encounter (Signed)
 Requested medication (s) are due for refill today: no  Requested medication (s) are on the active medication list: yes  Last refill:  10/19/24  Future visit scheduled: yew  Notes to clinic:  Unable to refill per protocol, cannot delegate. Unable to refuse duplicate request.      Requested Prescriptions  Pending Prescriptions Disp Refills   oxyCODONE -acetaminophen  (PERCOCET) 10-325 MG tablet [Pharmacy Med Name: OXYCODONE -APAP 10-325 MG TAB] 84 tablet     Sig: TAKE 1 TABLET BY MOUTH EVERY 4 HOURS AS NEEDED     Not Delegated - Analgesics:  Opioid Agonist Combinations Failed - 10/28/2024  3:32 PM      Failed - This refill cannot be delegated      Failed - Urine Drug Screen completed in last 360 days      Passed - Valid encounter within last 3 months    Recent Outpatient Visits           3 weeks ago Anxiety   [place] [facility] Marsa PARAS, [place]   1 month ago Cervical radiculopathy   [place] [facility]a PARAS, DO   8 months ago Pleuritic chest pain   [place] [facility]a PARAS, DO   9 months ago Essential hypertension   [place] [facility] [place], Marsa PARAS, [place]

## 2024-11-10 ENCOUNTER — Other Ambulatory Visit: Payer: Self-pay | Admitting: Family Medicine

## 2024-11-10 DIAGNOSIS — M5412 Radiculopathy, cervical region: Secondary | ICD-10-CM

## 2024-11-10 MED ORDER — OXYCODONE-ACETAMINOPHEN 10-325 MG PO TABS: 1.0000 | ORAL_TABLET | ORAL | 0 refills | Status: DC | PRN

## 2024-11-11 ENCOUNTER — Other Ambulatory Visit: Payer: Self-pay | Admitting: Family Medicine

## 2024-11-11 DIAGNOSIS — M5412 Radiculopathy, cervical region: Secondary | ICD-10-CM

## 2024-11-11 NOTE — Telephone Encounter (Signed)
 Requested medications are due for refill today.  no  Requested medications are on the active medications list.  yes  Last refill. 11/13/2023  Future visit scheduled.   yes  Notes to clinic.  Refill not delegated.   Requested Prescriptions  Pending Prescriptions Disp Refills   oxyCODONE -acetaminophen  (PERCOCET) 10-325 MG tablet [Pharmacy Med Name: OXYCODONE -APAP 10-325 MG TAB] 42 tablet     Sig: TAKE 1 TABLET BY MOUTH EVERY 4 HOURS AS NEEDED. MAX 3 DOSES IN 24 HOURS     Not Delegated - Analgesics:  Opioid Agonist Combinations Failed - 11/11/2024  4:19 PM      Failed - This refill cannot be delegated      Failed - Urine Drug Screen completed in last 360 days      Passed - Valid encounter within last 3 months    Recent Outpatient Visits           1 month ago Anxiety   [place] [facility] Marsa PARAS, DO   2 months ago Cervical radiculopathy   [place] [facility] [facility]a PARAS, DO   8 months ago Pleuritic chest pain   [place] [facility] Marsa PARAS, DO   10 months ago Essential hypertension   [facility] [facility] [place], Marsa PARAS, [place]

## 2024-11-25 ENCOUNTER — Telehealth: Payer: Self-pay | Admitting: Family Medicine

## 2024-11-25 DIAGNOSIS — M5412 Radiculopathy, cervical region: Secondary | ICD-10-CM

## 2024-11-25 MED ORDER — OXYCODONE-ACETAMINOPHEN 10-325 MG PO TABS: 1.0000 | ORAL_TABLET | Freq: Three times a day (TID) | ORAL | 0 refills | Status: AC | PRN

## 2024-11-25 NOTE — Telephone Encounter (Signed)
 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 He has been evaluated and they need me to sign off on his usage of Percocet and if he is cleared and safe to drive. As we stated before and in our documentation, he is to NOT drive while using Percocet, he may take the medication after driving shift. He was weaned down to 3 doses per 24 hours. He is being advised he has to be down to 1 or 2 doses in 24 hours now to proceed w/ clearance from Medical Examiner for DOT. We agreed if he may take 1 dose after shift and 1 more in evening that would clear him to proceed. He was on 3 doses per day now weaning further to 2 doses per day. Next rx sent for next fill tomorrow 11/26/24 Oxycodone  28 pills for 14 day supply, max 2 per day.  I called [facility] [employer] and discussed the case, they understand and agree with plan to proceed w/ max 2 per day, not while driving. They requested that I fax the paperwork to them today.  Marsa Officer, DO [facility] 11/25/2024, 1:03 PM

## 2025-02-23 ENCOUNTER — Other Ambulatory Visit

## 2025-03-02 ENCOUNTER — Encounter: Admitting: Family Medicine
